# Patient Record
Sex: Male | Born: 1950 | Race: White | Hispanic: No
Health system: Southern US, Community
[De-identification: ages and names within clinical notes are randomized; demographics above are authoritative.]

## PROBLEM LIST (undated history)

## (undated) DIAGNOSIS — M1990 Unspecified osteoarthritis, unspecified site: Secondary | ICD-10-CM

## (undated) DIAGNOSIS — G510 Bell's palsy: Secondary | ICD-10-CM

## (undated) DIAGNOSIS — R339 Retention of urine, unspecified: Secondary | ICD-10-CM

## (undated) DIAGNOSIS — R51 Headache: Secondary | ICD-10-CM

## (undated) DIAGNOSIS — F32A Depression, unspecified: Secondary | ICD-10-CM

## (undated) DIAGNOSIS — Z960 Presence of urogenital implants: Secondary | ICD-10-CM

## (undated) DIAGNOSIS — E785 Hyperlipidemia, unspecified: Secondary | ICD-10-CM

## (undated) DIAGNOSIS — I10 Essential (primary) hypertension: Secondary | ICD-10-CM

## (undated) DIAGNOSIS — N390 Urinary tract infection, site not specified: Secondary | ICD-10-CM

## (undated) DIAGNOSIS — K219 Gastro-esophageal reflux disease without esophagitis: Secondary | ICD-10-CM

## (undated) DIAGNOSIS — R351 Nocturia: Secondary | ICD-10-CM

## (undated) DIAGNOSIS — Z978 Presence of other specified devices: Secondary | ICD-10-CM

## (undated) DIAGNOSIS — F329 Major depressive disorder, single episode, unspecified: Secondary | ICD-10-CM

## (undated) DIAGNOSIS — R7303 Prediabetes: Secondary | ICD-10-CM

## (undated) HISTORY — PX: LAPAROSCOPIC LYSIS INTESTINAL ADHESIONS: SUR778

## (undated) HISTORY — PX: NISSEN FUNDOPLICATION: SHX2091

## (undated) HISTORY — DX: Prediabetes: R73.03

## (undated) HISTORY — DX: Hyperlipidemia, unspecified: E78.5

## (undated) HISTORY — DX: Essential (primary) hypertension: I10

## (undated) HISTORY — DX: Gastro-esophageal reflux disease without esophagitis: K21.9

## (undated) HISTORY — DX: Bell's palsy: G51.0

## (undated) HISTORY — PX: NERVE SURGERY: SHX1016

## (undated) HISTORY — PX: HERNIA REPAIR: SHX51

## (undated) HISTORY — PX: CARPAL TUNNEL RELEASE: SHX101

---

## 1997-09-23 ENCOUNTER — Ambulatory Visit (HOSPITAL_BASED_OUTPATIENT_CLINIC_OR_DEPARTMENT_OTHER): Admission: RE | Admit: 1997-09-23 | Discharge: 1997-09-23 | Payer: Self-pay | Admitting: General Surgery

## 2000-03-07 ENCOUNTER — Encounter (HOSPITAL_BASED_OUTPATIENT_CLINIC_OR_DEPARTMENT_OTHER): Payer: Self-pay | Admitting: General Surgery

## 2000-03-09 ENCOUNTER — Ambulatory Visit (HOSPITAL_COMMUNITY): Admission: RE | Admit: 2000-03-09 | Discharge: 2000-03-09 | Payer: Self-pay | Admitting: General Surgery

## 2000-03-09 ENCOUNTER — Encounter (INDEPENDENT_AMBULATORY_CARE_PROVIDER_SITE_OTHER): Payer: Self-pay | Admitting: *Deleted

## 2002-04-17 ENCOUNTER — Encounter: Payer: Self-pay | Admitting: Surgery

## 2002-04-25 ENCOUNTER — Observation Stay (HOSPITAL_COMMUNITY): Admission: RE | Admit: 2002-04-25 | Discharge: 2002-04-27 | Payer: Self-pay | Admitting: Surgery

## 2005-06-23 ENCOUNTER — Ambulatory Visit (HOSPITAL_COMMUNITY): Admission: RE | Admit: 2005-06-23 | Discharge: 2005-06-23 | Payer: Self-pay | Admitting: Internal Medicine

## 2005-06-30 ENCOUNTER — Ambulatory Visit: Payer: Self-pay

## 2005-12-26 ENCOUNTER — Ambulatory Visit (HOSPITAL_COMMUNITY): Admission: RE | Admit: 2005-12-26 | Discharge: 2005-12-26 | Payer: Self-pay | Admitting: Physician Assistant

## 2008-04-23 ENCOUNTER — Ambulatory Visit (HOSPITAL_COMMUNITY): Admission: RE | Admit: 2008-04-23 | Discharge: 2008-04-23 | Payer: Self-pay | Admitting: Internal Medicine

## 2008-04-28 ENCOUNTER — Encounter: Admission: RE | Admit: 2008-04-28 | Discharge: 2008-04-28 | Payer: Self-pay | Admitting: Family Medicine

## 2008-04-28 ENCOUNTER — Encounter (INDEPENDENT_AMBULATORY_CARE_PROVIDER_SITE_OTHER): Payer: Self-pay | Admitting: *Deleted

## 2008-05-02 ENCOUNTER — Encounter: Payer: Self-pay | Admitting: Gastroenterology

## 2008-05-05 ENCOUNTER — Telehealth: Payer: Self-pay | Admitting: Gastroenterology

## 2008-05-12 ENCOUNTER — Ambulatory Visit: Payer: Self-pay | Admitting: Cardiovascular Disease

## 2008-05-13 ENCOUNTER — Ambulatory Visit: Payer: Self-pay

## 2008-05-13 ENCOUNTER — Encounter: Payer: Self-pay | Admitting: Cardiovascular Disease

## 2008-05-16 LAB — HM COLONOSCOPY

## 2008-05-26 ENCOUNTER — Emergency Department (HOSPITAL_COMMUNITY): Admission: EM | Admit: 2008-05-26 | Discharge: 2008-05-26 | Payer: Self-pay | Admitting: Emergency Medicine

## 2008-05-26 ENCOUNTER — Ambulatory Visit: Payer: Self-pay | Admitting: Gastroenterology

## 2008-05-26 DIAGNOSIS — D509 Iron deficiency anemia, unspecified: Secondary | ICD-10-CM | POA: Insufficient documentation

## 2008-05-29 ENCOUNTER — Telehealth: Payer: Self-pay | Admitting: Gastroenterology

## 2008-06-03 ENCOUNTER — Telehealth: Payer: Self-pay | Admitting: Gastroenterology

## 2008-06-03 ENCOUNTER — Ambulatory Visit: Payer: Self-pay | Admitting: Gastroenterology

## 2008-06-03 ENCOUNTER — Encounter: Payer: Self-pay | Admitting: Gastroenterology

## 2008-06-05 ENCOUNTER — Encounter: Payer: Self-pay | Admitting: Gastroenterology

## 2008-06-06 ENCOUNTER — Ambulatory Visit (HOSPITAL_COMMUNITY): Admission: RE | Admit: 2008-06-06 | Discharge: 2008-06-06 | Payer: Self-pay | Admitting: Gastroenterology

## 2008-06-10 ENCOUNTER — Telehealth: Payer: Self-pay | Admitting: Gastroenterology

## 2008-06-11 ENCOUNTER — Encounter: Payer: Self-pay | Admitting: Gastroenterology

## 2008-06-17 ENCOUNTER — Ambulatory Visit (HOSPITAL_COMMUNITY): Admission: RE | Admit: 2008-06-17 | Discharge: 2008-06-17 | Payer: Self-pay | Admitting: Gastroenterology

## 2008-06-17 ENCOUNTER — Encounter: Payer: Self-pay | Admitting: Gastroenterology

## 2008-06-27 ENCOUNTER — Ambulatory Visit: Payer: Self-pay | Admitting: Gastroenterology

## 2008-07-04 ENCOUNTER — Ambulatory Visit: Payer: Self-pay | Admitting: Gastroenterology

## 2008-07-14 ENCOUNTER — Encounter: Payer: Self-pay | Admitting: Gastroenterology

## 2008-08-27 ENCOUNTER — Encounter: Payer: Self-pay | Admitting: Gastroenterology

## 2008-09-18 ENCOUNTER — Inpatient Hospital Stay (HOSPITAL_COMMUNITY): Admission: RE | Admit: 2008-09-18 | Discharge: 2008-09-21 | Payer: Self-pay | Admitting: General Surgery

## 2008-10-01 ENCOUNTER — Encounter: Payer: Self-pay | Admitting: Gastroenterology

## 2008-10-07 ENCOUNTER — Encounter: Payer: Self-pay | Admitting: Gastroenterology

## 2010-04-13 ENCOUNTER — Encounter: Payer: Self-pay | Admitting: Internal Medicine

## 2010-04-14 ENCOUNTER — Ambulatory Visit (HOSPITAL_COMMUNITY)
Admission: RE | Admit: 2010-04-14 | Discharge: 2010-04-14 | Payer: Self-pay | Source: Home / Self Care | Admitting: Internal Medicine

## 2010-04-26 ENCOUNTER — Ambulatory Visit: Payer: Self-pay | Admitting: Internal Medicine

## 2010-04-29 ENCOUNTER — Ambulatory Visit (HOSPITAL_COMMUNITY)
Admission: RE | Admit: 2010-04-29 | Discharge: 2010-04-29 | Payer: Self-pay | Source: Home / Self Care | Attending: Internal Medicine | Admitting: Internal Medicine

## 2010-04-29 ENCOUNTER — Encounter: Payer: Self-pay | Admitting: Internal Medicine

## 2010-04-29 ENCOUNTER — Ambulatory Visit: Payer: Self-pay | Admitting: Internal Medicine

## 2010-04-30 ENCOUNTER — Telehealth: Payer: Self-pay | Admitting: Internal Medicine

## 2010-04-30 ENCOUNTER — Encounter: Payer: Self-pay | Admitting: Internal Medicine

## 2010-05-03 ENCOUNTER — Telehealth (INDEPENDENT_AMBULATORY_CARE_PROVIDER_SITE_OTHER): Payer: Self-pay | Admitting: *Deleted

## 2010-05-24 ENCOUNTER — Ambulatory Visit (HOSPITAL_COMMUNITY)
Admission: RE | Admit: 2010-05-24 | Discharge: 2010-05-24 | Payer: Self-pay | Source: Home / Self Care | Attending: Internal Medicine | Admitting: Internal Medicine

## 2010-05-24 ENCOUNTER — Encounter: Payer: Self-pay | Admitting: Internal Medicine

## 2010-05-25 ENCOUNTER — Ambulatory Visit
Admission: RE | Admit: 2010-05-25 | Discharge: 2010-05-25 | Payer: Self-pay | Source: Home / Self Care | Attending: Internal Medicine | Admitting: Internal Medicine

## 2010-06-17 NOTE — Letter (Signed)
Summary: [facility]  [facility]   Imported By: Sherian Rein 05/05/2010 09:46:36  _____________________________________________________________________  External Attachment:    Type:   Image     Comment:   External Document

## 2010-06-17 NOTE — Progress Notes (Signed)
Summary: needs CPST  Phone Note Outgoing Call   Summary of Call: PFT normal. 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 Both do not explain dyspnea. Needs CPST WITH EIB. ROV on 05/25/2009 after CPST Initial call taken by: Kalman Shan MD,  April 30, 2010 5:57 PM

## 2010-06-17 NOTE — Letter (Signed)
Summary: CPST [facility]  [address]   [place], [place] [address]   Phone: [phone]  Fax: [phone]     Patient's Name: [name] Date of Birth: [birth year] MRN: [id number]  CPST  Choose test method and choice  a)_x__Bike - recommended by ATS/ACCP. Do at [facility] at Dr[facility] G[facility]  b)___Treadmill - less preferred. Do at [facility] at Dr. Gala Romney lab or do at [facility] PFT lab  Choose one or more indication for test  INDICATIONS FOR CARDIOPULMONARY EXERCISE TESTING Evaluation of exercise tolerance ______ Determination of functional impairment or capacity (peak V? O2) ______ Determination of exercise-limiting factors and pathophysiologic mechanisms  Evaluation of undiagnosed exercise intolerance __x___ Assessing contribution of cardiac and pulmonary etiology in coexisting disease ___x__ Symptoms disproportionate to resting pulmonary and cardiac tests  __x___Unexplained dyspnea when initial cardiopulmonary testing is nondiagnostic  Evaluation of patients with cardiovascular disease _____ Functional evaluation and prognosis in patients with heart failure _____ Selection for cardiac transplantation _____ Exercise prescription and monitoring response to exercise training for cardiac rehabilitation (special circumstances; i.e., pacemakers)  Evaluation of patients with respiratory disease _____ Functional impairment assessment (see specific clinical applications)  _____Chronic obstructive pulmonary disease Establishing exercise limitation(s) and assessing other potential contributing factors, especially occult heart disease (ischemia) ______Determination of magnitude of hypoxemia and for O2 prescription When objective determination of therapeutic intervention is necessary and not adequately addressed by standard pulmonary function testing  _____ Interstitial lung diseases _____Detection of early (occult) gas exchange abnormalities _____Overall  assessment/monitoring of pulmonary gas exchange _____Determination of magnitude of hypoxemia and for O2 prescription _____Determination of potential exercise-limiting factors _____Documentation of therapeutic response to potentially toxic therapy  ____ Pulmonary vascular disease (careful risk-benefit analysis required)  ____ Cystic fibrosis  __x__ Exercise-induced bronchospasm  Specific clinical applications ____  Preoperative evaluation _____Lung resectional surgery _____Elderly patients undergoing major abdominal surgery _____Lung volume resectional surgery for emphysema (currently investigational)  ____ Exercise evaluation and prescription for pulmonary rehabilitation  ____ Evaluation for impairment-disability  ____ Evaluation for lung, heart-lung transplantation ____ Definition of abbreviation: V? O2______ -oxygen consumption.    Kalman Shan MD    [facility]  Appended Document: CPST Form Appt scheduled for 05/24/10 at 11:30 [facility]. Appt information mailed to patient.

## 2010-06-17 NOTE — Miscellaneous (Signed)
Summary: Orders Update pft charges  Clinical Lists Changes  Orders: Added new Service order of Carbon Monoxide diffusing w/capacity (94720) - Signed Added new Service order of Lung Volumes (94240) - Signed Added new Service order of Spirometry (Pre & Post) (94060) - Signed 

## 2010-06-17 NOTE — Progress Notes (Signed)
Summary: returning call  Phone Note Call from Patient Call back at Home Phone [phone]   Caller: Patient Call For: ramaswamy Reason for Call: Talk to Nurse Summary of Call: Patient returning Dr. Jane Canary call. Initial call taken by: Lehman Prom,  May 03, 2010 9:33 AM  Follow-up for Phone Call        Pt informed of PFT and Sniff Test results.  Order for CPST sent to [facility] to schedule.  Pt aware to keep f/u appt with Ramaswamy om 05-25-09. Abigail Miyamoto RN  May 03, 2010 11:08 AM

## 2010-06-17 NOTE — Letter (Signed)
Summary: CPST- R/O Contraindications  [facility]  [address]   [place], [place] [address]   Phone: [phone]  Fax: [phone]    Patient's Name: [name] Date of Birth: 11-Mar-1951 MRN: [id number]  *********Rule out Contraindications**************** Absolute                                                                                                                           ___ Acute MI (3-5 Days)                                 ___ Unstable Angina                                          ___ Uncontrolled arrhythmias causing symptoms or hemodynamic compromise. ___ Syncope                                                     ___ Active endocarditis                                         ___ Acute Myocarditis/Pericarditis                        ___ Symptomatic severe aortic stenosis  ___ Acute Pulmonary embolus or pulmonary infarction                ___ Uncontrolled Heart Failure  ___ Thrombosis of lower extremitie ___ Suspected dissecting aneurysm ___ Uncontrolled Asthma                          ___ Pulmonary Edema                                        ___ RA desat @ rest<85%                                      ___ Repiratory Failure                                         ___ Acute noncardiopulmonary disorder that may affect exercise performance or be         aggravated by exercise (ie infection, renal failure,  thyrotoxicosis) .                               ___ Mental impairment leading to inabliity to cooperate   Relative ___ Left main coronary stenosis or its equivalent ___ Moderate stentoic valvular heart disease ___ Severe untreated arterial hypertension @ rest (<200 mmHg             systolic,>132mmHg Diastolic ___ Tachy/Brady Arrhythmias ___ High- degree artioventricular block ___ Hypertrophic cardiomyopathy ___ Significant pulmonary hypertension ___ Advanced or complicated pregnancy ___ Electrolyte abnormalities ___ Orthopedic impairment that  compromises exercise performance  NO CONTRAINDICATIONS  Kalman Shan MD    [facility]

## 2010-06-17 NOTE — Assessment & Plan Note (Signed)
Summary: F/U DYSPNEA/RJC   Visit Type:  Follow-up Copy to:  Dr. Lucky Cowboy Primary Provider/Referring Provider:  Dr. Lucky Cowboy - PMD, DR Bertram Savin - [facility]  CC:  Pt here to discuss CPST results. .  History of Present Illness: 60 year old male. Sent by PMD because of hypoxemia discovered at PMD office on 04/13/2010 for routine physica.. States that even a year ago when in hospital for hiatal hernia repair o2 was low but never started on O2.  He does admit to dyspnea since 1991. Insidious onset. Was pasisve exposed to cig smoke for 34 years and paint fumes at [facility] pain department 1998 - 2000.  Normal echo per chart review in dec 2009 except mild diast dysfn.  Dxde with large hiatal hernia with stomach in Abdomen on CT dec 2009. It was then suspected that dyspnea was due to hernia. s/p hernia repair and nissen fundoplication in May 2010.  However, since surgery dyspnea is worse.  Currently dyspneic for walking 100 feet, any strenous work at [employer].  Dyspnea improved by lying dow, relazing, sitting in wheel chair.  CXR 04/14/2010 shows new left diaph paralysis since Dec 2009.   He does admits to [employer] epigastric/lower chest pain that is atypical. Pain made worse by physicla pressure on spot or lifting. Pain improved by deep breaths. No associated radiation. Denies associated cough, wheezing, syncope, hemoptysis. Of note, his room air puse ox at rest was88% but after walking it improved to 94%. REC: FULL PFT CPST and SNIFF TEST   May 25, 2010: followup dyspnea. Returns to review test restults. 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 Full PFts were normal. Underwent CPST yesterday 05/24/2010. This was normal exccept for slight inadequate HR resposse due to beta blockade and mild dead space ventilation increase. Overall normal test though. He states though dyspnea persists unchanged. No new symptoms   Preventive Screening-Counseling &  Management  Alcohol-Tobacco     Smoking Status: never     Passive Smoke Exposure: yes  Current Medications (verified): 1)  Cyclobenzaprine Hcl 10 Mg Tabs (Cyclobenzaprine Hcl) .... 2 Daily 2)  Complete Allergy Relief 25 Mg Tabs (Diphenhydramine Hcl) .Marland Kitchen.. 1 Tablet By Mouth Once Daily 3)  Omeprazole 20 Mg  Cpdr (Omeprazole) .Marland Kitchen.. 1 Each Day 30 Minutes Before Meal 4)  Advair Diskus 250-50 Mcg/dose Misc (Fluticasone-Salmeterol) .Marland Kitchen.. 1 Puff in The Morning and 1 Puff At Night 5)  Adult Aspirin Low Strength 81 Mg Tbdp (Aspirin) .Marland Kitchen.. 1 By Mouth Once Daily 6)  Tramadol Hcl 50 Mg Tabs (Tramadol Hcl) .Marland Kitchen.. 1 By Mouth Once Daily 7)  Meclizine Hcl 25 Mg Tabs (Meclizine Hcl) .Marland Kitchen.. 1 By Mouth Once Daily 8)  Cvs Calcium Carbonate/vit D 500-125 Mg-Unit Tabs (Calcium Carbonate-Vitamin D) .Marland Kitchen.. 1 Three Times A Day 9)  Pravachol 40 Mg Tabs (Pravastatin Sodium) .... 1/2 By Mouth Once Daily 10)  Atenolol 100 Mg Tabs (Atenolol) .Marland Kitchen.. 1 By Mouth Once Daily 11)  Citalopram Hydrobromide 40 Mg Tabs (Citalopram Hydrobromide) .Marland Kitchen.. 1 By Mouth Once Daily 12)  Meloxicam 15 Mg Tabs (Meloxicam) .Marland Kitchen.. 1 By Mouth Once Daily 13)  Multivitamins   Tabs (Multiple Vitamin) .Marland Kitchen.. 1 By Mouth Once Daily 14)  Fish Oil   Oil (Fish Oil) .Marland Kitchen.. 1 By Mouth Once Daily 15)  Feosol 200 (65 Fe) Mg Tabs (Ferrous Sulfate Dried) .... Take One Tab Twice A Day  Allergies (verified): No Known Drug Allergies  Past History:  Past medical, surgical, family and social histories (including risk  factors) reviewed, and no changes noted (except as noted below).  Past Medical History: Reviewed history from 04/26/2010 and no changes required. Hyperlipidemia Hypertension Allergic Rhinitis Dyspnea/Hypoxemia  - ct dec 2009 - large hiatalhernia - stomach in chest. s/p repair may 2010  - echo dec 2009 - diast dysfhn - repeat cxr nov 2011 - left dipahr paralysis  Past Surgical History: Reviewed history from 04/26/2010 and no changes required. 1.  Laparoscopic paraesophageal hernia repair, large. - May 6. 2010  2. Nissen fundoplication.   3. Lysis adhesions approximately thirty minutes.   Family History: Reviewed history from 05/26/2008 and no changes required. Family History of Colon Cancer: mother Family History of Prostate Cancer: father  Social History: Reviewed history from 04/26/2010 and no changes required. 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  Wife was a smoker x 34 years Alcohol Use - no Daily Caffeine Use Illicit Drug Use - no  Review of Systems       The patient complains of shortness of breath with activity.  The patient denies shortness of breath at rest, productive cough, non-productive cough, coughing up blood, chest pain, irregular heartbeats, acid heartburn, indigestion, loss of appetite, weight change, abdominal pain, difficulty swallowing, sore throat, tooth/dental problems, headaches, nasal congestion/difficulty breathing through nose, sneezing, itching, ear ache, anxiety, depression, hand/feet swelling, joint stiffness or pain, rash, change in color of mucus, and fever.    Vital Signs:  Patient profile:   60 year old male Height:      73 inches Weight:      231.25 pounds BMI:     30.62 O2 Sat:      90 % on Room air Temp:     98.0 degrees F oral Pulse rate:   72 / minute BP sitting:   106 / 62  (right arm) Cuff size:   regular  Vitals Entered By: Carron Curie CMA (May 25, 2010 3:17 PM)  O2 Flow:  Room air CC: Pt here to discuss CPST results.  Comments Medications reviewed with patient Carron Curie CMA  May 25, 2010 3:17 PM Daytime phone number verified with patient.    Physical Exam  General:  well developed, well nourished, in no acute distress Head:  normocephalic and atraumatic Eyes:  PERRLA/EOM intact; conjunctiva and sclera clear Ears:  TMs intact and clear with normal canals Nose:  no deformity, discharge, inflammation, or  lesions Mouth:  no deformity or lesions Neck:  no masses, thyromegaly, or abnormal cervical nodes Chest Wall:  no deformities noted Lungs:  clear bilaterally to auscultation and percussion Heart:  regular rate and rhythm, S1, S2 without murmurs, rubs, gallops, or clicks Abdomen:  bowel sounds positive; abdomen soft and non-tender without masses, or organomegaly Msk:  no deformity or scoliosis noted with normal posture Pulses:  pulses normal Extremities:  no clubbing, cyanosis, edema, or deformity noted Neurologic:  CN II-XII grossly intact with normal reflexes, coordination, muscle strength and tone Skin:  intact without lesions or rashes Cervical Nodes:  no significant adenopathy Axillary Nodes:  no significant adenopathy Psych:  alert and cooperative; normal mood and affect; normal attention span and concentration   MISC. 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 Full PFts were normal. Underwent CPST yesterday 05/24/2010. This was normal exccept for slight inadequate HR resposse due to beta blockade and mild dead space ventilation increase. Overall normal test though.  Impression & Recommendations:  Problem # 1:  DYSPNEA (ICD-786.05) Assessment Unchanged Ex heavy passive smoker. Has chronic dyspnea on exertion with hyooxemia at rest. Dysnea is at class 2-3 level. On walking he does not sem to desaturate ad in fact pulse ox picked up suggesting underlying atelectasis related to raised left diaphragm. . 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 CPST test 05/24/2010 is normal but for mild reduction in HRresponse due to beta blocker and mild elevation in dead space possibly rlated to the left diaprhagm issue.   PLAN would strongly recommend pulmonary rehab if not better from this , could consider cuttng down on beta blocker rov 3 months he  agrees with plan  Other Orders: Rehabilitation Referral (Rehab) Est. Patient Level III ([address])  Patient Instructions: 1)  your difficulty breathing is becuse of  2)   - left diaphragm partly not working  3)   - partly becuase you are out of shape after surgery 4)  recommend pulmonary rehab program at [facility] 5)    - take 1 month to get in 6)   - class is on tuesday/thursday 7)   - lasts 2-3 months 8)  return to see me in 3 months 9)  i will send note to your doctors and speak to Dr. Freida Busman the surgeon

## 2010-06-17 NOTE — Assessment & Plan Note (Signed)
Summary: sob/low o2 sats/cb   Visit Type:  Initial Consult Copy to:  Dr. Lucky Cowboy Primary Provider/Referring Provider:  Dr. Lucky Cowboy  CC:  Pulmonary Consult for SOB. [name]  History of Present Illness: 60 year old male. Sent by PMD because of hypoxemia discovered at PMD office on 04/13/2010 for routine physica.. States that even a year ago when in hospital for hiatal hernia repair o2 was low but never started on O2.  He does admit to dyspnea since 1991. Insidious onset. Was pasisve exposed to cig smoke for 34 years and paint fumes at [facility] pain department 1998 - 2000.  Normal echo per chart review in dec 2009 except mild diast dysfn.  Dxde with large hiatal hernia with stomach in Abdomen on CT dec 2009. It was then suspected that dyspnea was due to hernia. s/p hernia repair and nissen fundoplication in May 2010.  However, since surgery dyspnea is worse.  Currently dyspneic for walking 100 feet, any strenous work at [employer].  Dyspnea improved by lying dow, relazing, sitting in wheel chair.  CXR 04/14/2010 shows new left diaph paralysis since Dec 2009.   He does admits to [employer] epigastric/lower chest pain that is atypical. Pain made worse by physicla pressure on spot or lifting. Pain improved by deep breaths. No associated radiation. Denies associated cough, wheezing, syncope, hemoptysis  Of note, his room air puse ox at rest was88% but after walking it improved to 94%  Preventive Screening-Counseling & Management  Alcohol-Tobacco     Smoking Status: never     Passive Smoke Exposure: yes  Comments: wife passed in 2007. Exposed to her cigarettes for 35 years  Current Medications (verified): 1)  Cyclobenzaprine Hcl 10 Mg Tabs (Cyclobenzaprine Hcl) .... 2 Daily 2)  Complete Allergy Relief 25 Mg Tabs (Diphenhydramine Hcl) .[name].. 1 Tablet By Mouth Once Daily 3)  Omeprazole 20 Mg  Cpdr (Omeprazole) .[name].. 1 Each Day 30 Minutes Before Meal 4)  Advair Diskus 250-50 Mcg/dose Misc  (Fluticasone-Salmeterol) .[name].. 1 Puff in The Morning and 1 Puff At Night 5)  Adult Aspirin Low Strength 81 Mg Tbdp (Aspirin) .[name].. 1 By Mouth Once Daily 6)  Acai 500 Mg Caps (Acai) .[name].. 1 By Mouth Once Daily 7)  Tramadol Hcl 50 Mg Tabs (Tramadol Hcl) .[name].. 1 By Mouth Once Daily 8)  Meclizine Hcl 25 Mg Tabs (Meclizine Hcl) .[name].. 1 By Mouth Once Daily 9)  Cvs Calcium Carbonate/vit D 500-125 Mg-Unit Tabs (Calcium Carbonate-Vitamin D) .[name].. 1 Three Times A Day 10)  Pravachol 40 Mg Tabs (Pravastatin Sodium) .... 1/2 By Mouth Once Daily 11)  Atenolol 100 Mg Tabs (Atenolol) .[name].. 1 By Mouth Once Daily 12)  Citalopram Hydrobromide 40 Mg Tabs (Citalopram Hydrobromide) .[name].. 1 By Mouth Once Daily 13)  Meloxicam 15 Mg Tabs (Meloxicam) .[name].. 1 By Mouth Once Daily 14)  Multivitamins   Tabs (Multiple Vitamin) .[name].. 1 By Mouth Once Daily 15)  Fish Oil   Oil (Fish Oil) .[name].. 1 By Mouth Once Daily 16)  Feosol 200 (65 Fe) Mg Tabs (Ferrous Sulfate Dried) .... Take One Tab Twice A Day 17)  Tamsulosin Hcl 0.4 Mg Caps (Tamsulosin Hcl) .... Take 1 Tablet By Mouth Once A Day  Allergies (verified): No Known Drug Allergies  Past History:  Past Medical History: Hyperlipidemia Hypertension Allergic Rhinitis Dyspnea/Hypoxemia  - ct dec 2009 - large hiatalhernia - stomach in chest. s/p repair may 2010  - echo dec 2009 - diast dysfhn - repeat cxr nov 2011 - left dipahr  paralysis  Past Surgical History: 1. Laparoscopic paraesophageal hernia repair, large. - May 6. 2010  2. Nissen fundoplication.   3. Lysis adhesions approximately thirty minutes.   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  Wife was a smoker x 34 years Alcohol Use - no Daily Caffeine Use Illicit Drug Use - no Passive Smoke Exposure:  yes  Review of Systems       The patient complains of shortness of breath with activity, chest pain, acid heartburn, indigestion, depression, and joint stiffness or pain.   The patient denies shortness of breath at rest, productive cough, non-productive cough, coughing up blood, irregular heartbeats, loss of appetite, weight change, abdominal pain, difficulty swallowing, sore throat, tooth/dental problems, headaches, nasal congestion/difficulty breathing through nose, sneezing, itching, ear ache, anxiety, hand/feet swelling, rash, change in color of mucus, and fever.    Vital Signs:  Patient profile:   60 year old male Height:      73 inches Weight:      232.50 pounds BMI:     30.79 O2 Sat:      88 % on Room air Temp:     97.8 degrees F oral Pulse rate:   83 / minute BP sitting:   142 / 98  (right arm) Cuff size:   regular  Vitals Entered By: Carron Curie CMA (April 26, 2010 3:57 PM)  O2 Flow:  Room air CC: Pulmonary Consult for SOB.  Comments Medications reviewed with patient Carron Curie CMA  April 26, 2010 3:57 PM Daytime phone number verified with patient.    Physical Exam  General:  well developed, well nourished, in no acute distress Head:  normocephalic and atraumatic Eyes:  PERRLA/EOM intact; conjunctiva and sclera clear Ears:  TMs intact and clear with normal canals Nose:  no deformity, discharge, inflammation, or lesions Mouth:  no deformity or lesions Neck:  no masses, thyromegaly, or abnormal cervical nodes Chest Wall:  no deformities noted Lungs:  clear bilaterally to auscultation and percussion Heart:  regular rate and rhythm, S1, S2 without murmurs, rubs, gallops, or clicks Abdomen:  bowel sounds positive; abdomen soft and non-tender without masses, or organomegaly Msk:  no deformity or scoliosis noted with normal posture Pulses:  pulses normal Extremities:  no clubbing, cyanosis, edema, or deformity noted Neurologic:  CN II-XII grossly intact with normal reflexes, coordination, muscle strength and tone Skin:  intact without lesions or rashes Cervical Nodes:  no significant adenopathy Axillary Nodes:  no  significant adenopathy Psych:  alert and cooperative; normal mood and affect; normal attention span and concentration   CXR  Procedure date:  04/14/2010  Findings:      Service Report Text  [facility] Accession Number: [id number]      Clinical Data: Shortness of breath.  Chest discomfort.   Hypertension.    CHEST - 2 VIEW    Comparison: 04/23/2008    Findings: Heart size is within normal limits.  Both lungs are   clear.  No evidence of pleural effusion.  No mass or adenopathy   identified.    IMPRESSION:   No active cardiopulmonary disease.    Read By:  Danae Orleans,  M.D.   Released By:  Danae Orleans,  M.D.  Comments:      in my opirion there is elevation of LEFT DIAPHRAGM which is NEW since 04/23/2008  CT of Chest  Procedure date:  05/30/2007  Findings:      clear lung fields large hiatal hernia -  almost entire stomach in chest  Impression & Recommendations:  Problem # 1:  DYSPNEA (ICD-786.05) Assessment New Ex heavy passive smoker. Has chronic dyspnea on exertion with hyooxemia at rest. Dysnea is at class 2-3 level. On walking he does not sem to desaturate. Latest cxr suggest postop diaphramatic left side paralysis. DDx includes raised left diaphragm with associated atelctasis that actually improves on exertion or COPD or deconditioning  plan sniff test full pft review after above Orders: Pulmonary Referral (Pulmonary) Radiology Referral (Radiology) Consultation Level V [phone])  Medications Added to Medication List This Visit: 1)  Cyclobenzaprine Hcl 10 Mg Tabs (Cyclobenzaprine hcl) .... 2 daily 2)  Tamsulosin Hcl 0.4 Mg Caps (Tamsulosin hcl) .... Take 1 tablet by mouth once a day  Patient Instructions: 1)  please have full breathing test called PFT 2)   - at [place] or [facility] 3)  please have a test called SNIFF test 4)    - [place] [facility] 5)  return to see me after above in < 2weeks   Immunization History:  Influenza  Immunization History:    Influenza:  historical (03/16/2010)  Pneumovax Immunization History:    Pneumovax:  historical (03/20/2006)  Appended Document: sob/low o2 sats/cb Ambulatory Pulse Oximetry  Resting; HR____69_    02 Sat_93____  Lap1 (185 feet)   HR__77___   02 Sat__94___ Lap2 (185 feet)   HR__82___   02 Sat_92____    Lap3 (185 feet)   HR___88__   02 Sat__94___  __x_Test Completed without Difficulty ___Test Stopped due to:

## 2010-07-23 ENCOUNTER — Telehealth: Payer: Self-pay | Admitting: Internal Medicine

## 2010-07-29 ENCOUNTER — Encounter: Payer: Self-pay | Admitting: Internal Medicine

## 2010-08-02 ENCOUNTER — Encounter (HOSPITAL_COMMUNITY): Payer: BC Managed Care – PPO | Attending: Internal Medicine

## 2010-08-02 DIAGNOSIS — R0902 Hypoxemia: Secondary | ICD-10-CM | POA: Insufficient documentation

## 2010-08-02 DIAGNOSIS — T59891A Toxic effect of other specified gases, fumes and vapors, accidental (unintentional), initial encounter: Secondary | ICD-10-CM | POA: Insufficient documentation

## 2010-08-02 DIAGNOSIS — T59811A Toxic effect of smoke, accidental (unintentional), initial encounter: Secondary | ICD-10-CM | POA: Insufficient documentation

## 2010-08-02 DIAGNOSIS — E785 Hyperlipidemia, unspecified: Secondary | ICD-10-CM | POA: Insufficient documentation

## 2010-08-02 DIAGNOSIS — I10 Essential (primary) hypertension: Secondary | ICD-10-CM | POA: Insufficient documentation

## 2010-08-03 NOTE — Letter (Signed)
Summary: [facility]  [address]   [place], [place] [address]   Phone: [phone]  Fax: [phone]    07/29/2010  [name] [address] [place], [place]  [address]  To Whom it May Concern,    The above named patient needs to be allowed to take short breaks to catch his breath during his work day due to his medical condition. Please contact my office with any additional questions.            Sincerely,   Dr. Kalman Shan

## 2010-08-03 NOTE — Progress Notes (Signed)
Summary: work Statistician Note Call from Patient Call back at [employer] [phone]   Caller: Patient Call For: Odai Wimmer Summary of Call: Patient needing a work note stating he should be able to sit down when sob.   Initial call taken by: Lehman Prom,  July 23, 2010 12:39 PM  Follow-up for Phone Call        Spoke with pt.  He wanted to let MR know that he is going to be starting pulm rehab next month.  He also wants to know if MR will dictate a letter for him stating that sometimes her gets SOB and needs to be able to sit and rest while at work when this happens.  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  I advised that MR out of the office today and will be back on 3/12.  Pt verbalized understanding and states that if MR does decide to do a letter, he would like this mailed to him. Pls advise thanks Follow-up by: Vernie Murders,  July 23, 2010 4:38 PM  Additional Follow-up for Phone Call Additional follow up Details #1::        ok to do letter for him to sit down for a little bit to catch his breath while at work. Can you guys do this letter or I should do ? Additional Follow-up by: Kalman Shan MD,  July 23, 2010 5:03 PM    Additional Follow-up for Phone Call Additional follow up Details #2::    Per TD, this should be done by the physician. Thanks Vernie Murders  July 23, 2010 5:08 PM   Additional Follow-up for Phone Call Additional follow up Details #3:: Details for Additional Follow-up Action Taken: forward to [facility] Additional Follow-up by: Kalman Shan MD,  July 28, 2010 5:49 PM  letter written and mailed and pt aware.Carron Curie CMA  July 29, 2010 2:53 PM

## 2010-08-11 ENCOUNTER — Encounter: Payer: Self-pay | Admitting: Internal Medicine

## 2010-08-17 ENCOUNTER — Encounter (HOSPITAL_COMMUNITY): Payer: BC Managed Care – PPO | Attending: Internal Medicine

## 2010-08-17 DIAGNOSIS — I10 Essential (primary) hypertension: Secondary | ICD-10-CM | POA: Insufficient documentation

## 2010-08-17 DIAGNOSIS — E785 Hyperlipidemia, unspecified: Secondary | ICD-10-CM | POA: Insufficient documentation

## 2010-08-17 DIAGNOSIS — R0902 Hypoxemia: Secondary | ICD-10-CM | POA: Insufficient documentation

## 2010-08-17 DIAGNOSIS — T59891A Toxic effect of other specified gases, fumes and vapors, accidental (unintentional), initial encounter: Secondary | ICD-10-CM | POA: Insufficient documentation

## 2010-08-17 DIAGNOSIS — T59811A Toxic effect of smoke, accidental (unintentional), initial encounter: Secondary | ICD-10-CM | POA: Insufficient documentation

## 2010-08-19 ENCOUNTER — Encounter (HOSPITAL_COMMUNITY): Payer: BC Managed Care – PPO

## 2010-08-24 ENCOUNTER — Encounter (HOSPITAL_COMMUNITY): Payer: BC Managed Care – PPO

## 2010-08-24 LAB — BASIC METABOLIC PANEL
CO2: 28 mEq/L (ref 19–32)
Calcium: 8.3 mg/dL — ABNORMAL LOW (ref 8.4–10.5)
Creatinine, Ser: 0.98 mg/dL (ref 0.4–1.5)
GFR calc Af Amer: >60 mL/min (ref >60)
GFR calc non Af Amer: >60 mL/min (ref >60)

## 2010-08-25 LAB — BASIC METABOLIC PANEL
Calcium: 9.4 mg/dL (ref 8.4–10.5)
GFR calc Af Amer: >60 mL/min (ref >60)
GFR calc non Af Amer: >60 mL/min (ref >60)
Glucose, Bld: 99 mg/dL (ref 70–99)
Sodium: 141 mEq/L (ref 135–145)

## 2010-08-25 LAB — CBC
Hemoglobin: 10.6 g/dL — ABNORMAL LOW (ref 13.0–17.0)
RDW: 20.3 % — ABNORMAL HIGH (ref 11.5–15.5)

## 2010-08-25 LAB — DIFFERENTIAL
Basophils Absolute: 0 10*3/uL (ref 0.0–0.1)
Lymphocytes Relative: 22 % (ref 12–46)
Monocytes Absolute: 0.5 10*3/uL (ref 0.1–1.0)
Neutro Abs: 3.7 10*3/uL (ref 1.7–7.7)

## 2010-08-26 ENCOUNTER — Encounter (HOSPITAL_COMMUNITY): Payer: BC Managed Care – PPO

## 2010-08-30 LAB — COMPREHENSIVE METABOLIC PANEL
Alkaline Phosphatase: 60 U/L (ref 39–117)
BUN: 21 mg/dL (ref 6–23)
CO2: 27 mEq/L (ref 19–32)
Chloride: 103 mEq/L (ref 96–112)
Glucose, Bld: 118 mg/dL — ABNORMAL HIGH (ref 70–99)
Potassium: 4 mEq/L (ref 3.5–5.1)
Total Bilirubin: 0.7 mg/dL (ref 0.3–1.2)

## 2010-08-30 LAB — CBC
HCT: 28.7 % — ABNORMAL LOW (ref 39.0–52.0)
Hemoglobin: 9.2 g/dL — ABNORMAL LOW (ref 13.0–17.0)
WBC: 8.4 10*3/uL (ref 4.0–10.5)

## 2010-08-30 LAB — DIFFERENTIAL
Basophils Absolute: 0 10*3/uL (ref 0.0–0.1)
Basophils Relative: 0 % (ref 0–1)
Neutro Abs: 7.1 10*3/uL (ref 1.7–7.7)
Neutrophils Relative %: 84 % — ABNORMAL HIGH (ref 43–77)

## 2010-08-31 ENCOUNTER — Ambulatory Visit: Payer: Self-pay | Admitting: Internal Medicine

## 2010-08-31 ENCOUNTER — Encounter (HOSPITAL_COMMUNITY): Payer: BC Managed Care – PPO

## 2010-09-02 ENCOUNTER — Encounter (HOSPITAL_COMMUNITY): Payer: BC Managed Care – PPO

## 2010-09-07 ENCOUNTER — Encounter (HOSPITAL_COMMUNITY): Payer: BC Managed Care – PPO

## 2010-09-09 ENCOUNTER — Encounter (HOSPITAL_COMMUNITY): Payer: BC Managed Care – PPO

## 2010-09-14 ENCOUNTER — Encounter (HOSPITAL_COMMUNITY): Payer: BC Managed Care – PPO | Attending: Internal Medicine

## 2010-09-14 DIAGNOSIS — I10 Essential (primary) hypertension: Secondary | ICD-10-CM | POA: Insufficient documentation

## 2010-09-14 DIAGNOSIS — E785 Hyperlipidemia, unspecified: Secondary | ICD-10-CM | POA: Insufficient documentation

## 2010-09-14 DIAGNOSIS — T59891A Toxic effect of other specified gases, fumes and vapors, accidental (unintentional), initial encounter: Secondary | ICD-10-CM | POA: Insufficient documentation

## 2010-09-14 DIAGNOSIS — R0902 Hypoxemia: Secondary | ICD-10-CM | POA: Insufficient documentation

## 2010-09-14 DIAGNOSIS — T59811A Toxic effect of smoke, accidental (unintentional), initial encounter: Secondary | ICD-10-CM | POA: Insufficient documentation

## 2010-09-15 ENCOUNTER — Encounter: Payer: Self-pay | Admitting: Internal Medicine

## 2010-09-16 ENCOUNTER — Encounter (HOSPITAL_COMMUNITY): Payer: BC Managed Care – PPO

## 2010-09-17 ENCOUNTER — Encounter: Payer: Self-pay | Admitting: Internal Medicine

## 2010-09-17 ENCOUNTER — Ambulatory Visit (INDEPENDENT_AMBULATORY_CARE_PROVIDER_SITE_OTHER): Payer: BC Managed Care – PPO | Admitting: Internal Medicine

## 2010-09-17 VITALS — BP 108/78 | HR 63 | Temp 98.2°F | Ht 73.0 in | Wt 228.4 lb

## 2010-09-17 DIAGNOSIS — R0602 Shortness of breath: Secondary | ICD-10-CM

## 2010-09-17 NOTE — Progress Notes (Signed)
Subjective:    Patient ID: [name], male    DOB: Jun 11, 1950, 60 y.o.   MRN: [id number]  HPI  History of Present Illness: 60 year old male. Sent by PMD because of hypoxemia discovered at PMD office on 04/13/2010 for routine physica.. States that even a year ago when in hospital for hiatal hernia repair o2 was low but never started on O2.  He does admit to dyspnea since 1991. Insidious onset. 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  Normal echo per chart review in dec 2009 except mild diast dysfn.  Dxde with large hiatal hernia with stomach in Abdomen on CT dec 2009. It was then suspected that dyspnea was due to hernia. s/p hernia repair and nissen fundoplication in May 2010.  However, since surgery dyspnea is worse.  Currently dyspneic for walking 100 feet, any strenous work at [employer].  Dyspnea improved by lying dow, relazing, sitting in wheel chair.  CXR 04/14/2010 shows new left diaph paralysis since Dec 2009.   He does admits to [employer] epigastric/lower chest pain that is atypical. Pain made worse by physicla pressure on spot or lifting. Pain improved by deep breaths. No associated radiation. Denies associated cough, wheezing, syncope, hemoptysis. Of note, his room air puse ox at rest was88% but after walking it improved to 94%. REC: FULL PFT CPST and SNIFF TEST   May 25, 2010: followup dyspnea. Returns to review test restults. 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 Full PFts were normal. Underwent CPST yesterday 05/24/2010. This was normal exccept for slight inadequate HR resposse due to beta blockade and mild dead space ventilation increase. Overall normal test though. He states though dyspnea persists unchanged. No new symptoms. REC: Pulmonary rehab  OV 09/17/2010: some dyspnea with humidity but denies it is asthma. Overall > 50% better with rehab that he has completed 5 weeks. Feels really well.  Denies all other complaints. Does not feel he has asthma. Denies wheeze, orthopnea, pnd, edema, fever, chills. Has intentionally lost 16# weight    Review of Systems   The patient denies shortness of breath at rest, productive cough, non-productive cough, coughing up blood, chest pain, irregular heartbeats, acid heartburn, indigestion, loss of appetite, abdominal pain, difficulty swallowing, sore throat, tooth/dental problems, headaches, nasal congestion/difficulty breathing through nose, sneezing, itching, ear ache, anxiety, depression, hand/feet swelling, joint stiffness or pain, rash, change in color of mucus, and fever.      Objective:   Physical Exam General:  well developed, well nourished, in no acute distress Head:  normocephalic and atraumatic Eyes:  PERRLA/EOM intact; conjunctiva and sclera clear Ears:  TMs intact and clear with normal canals Nose:  no deformity, discharge, inflammation, or lesions Mouth:  no deformity or lesions Neck:  no masses, thyromegaly, or abnormal cervical nodes Chest Wall:  no deformities noted Lungs:  clear bilaterally to auscultation and percussion Heart:  regular rate and rhythm, S1, S2 without murmurs, rubs, gallops, or clicks Abdomen:  bowel sounds positive; abdomen soft and non-tender without masses, or organomegaly Msk:  no deformity or scoliosis noted with normal posture Pulses:  pulses normal Extremities:  no clubbing, cyanosis, edema, or deformity noted Neurologic:  CN II-XII grossly intact with normal reflexes, coordination, muscle strength and tone Skin:  intact without lesions or rashes Cervical Nodes:  no significant adenopathy Axillary Nodes:  no significant adenopathy Psych:  alert and cooperative; normal mood and affect; normal attention span  and concentration        Assessment & Plan:

## 2010-09-17 NOTE — Patient Instructions (Addendum)
Glad you are better Monitor your shortness of breath over time  If you feel humidity or other issues make your shortness of breath worse, call us and we can re-evaluate The bike test did not show evidence of exercise asthma

## 2010-09-18 ENCOUNTER — Encounter: Payer: Self-pay | Admitting: Internal Medicine

## 2010-09-18 NOTE — Assessment & Plan Note (Signed)
Ex heavy passive smoker. Has chronic dyspnea on exertion with hyooxemia at rest. Dysnea is at class 2-3 level. On walking he does not sem to desaturate ad in fact pulse ox picked up suggesting underlying atelectasis related to raised left diaphragm. . 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 CPST test 05/24/2010 is normal but for mild reduction in HRresponse due to beta blocker and mild elevation in dead space possibly rlated to the left diaprhagm issue. There was no evidence of exercise induced bronchospasm on CPST. He comenced pulmonary rehab in March 2012 andd by May 2012 he had improved significantly with pulmnary rehab. This suggests deconditioning and weight issues and diaphragm as main causes of dyspnea. Given improvement I see no further need to followup here unless symptoms recur. He has agreed to this plan

## 2010-09-21 ENCOUNTER — Encounter (HOSPITAL_COMMUNITY): Payer: BC Managed Care – PPO

## 2010-09-23 ENCOUNTER — Encounter (HOSPITAL_COMMUNITY): Payer: BC Managed Care – PPO

## 2010-09-28 ENCOUNTER — Encounter (HOSPITAL_COMMUNITY): Payer: BC Managed Care – PPO

## 2010-09-28 NOTE — Discharge Summary (Signed)
NAMEDAVIUS, GOUDEAU NO.:  [id number]   MEDICAL RECORD NO.:  [id number]          PATIENT TYPE:  INP   LOCATION:  1531                         FACILITY:  [facility]   PHYSICIAN:  Lennie Muckle, MD      DATE OF BIRTH:  04-May-1951   DATE OF ADMISSION:  09/18/2008  DATE OF DISCHARGE:  09/21/2008                               DISCHARGE SUMMARY   DATE OF SURGERY:  Sep 18, 2008.   DIAGNOSIS:  Large paraesophageal Hernia.   PROCEDURE:  On Sep 18, 2008 laparoscopic paraesophageal hernia repair,  Nissen fundoplication, and lysis of adhesions.  Surgeon,  Bertram Savin,  MD.  Assistant:  Karie Soda.   HOSPITAL COURSE:  Mr. Anfinson is a very pleasant 60 year old male who had  a paraesophageal hernia.  He underwent surgical repair on May 6.  He has  done well following his repair.  I did get a gastrograph and swallow on  postop day #1 which showed no leak.  Hernia repair was intact.  He had  some mild nausea on the first day which eventually resolved.  He has had  pain well controlled with oxycodone elixir.  He has had no episodes of  shortness of breath. He has had no real difficulty with urination after  discontinuation of the Foley.  He is ambulating without difficulty and  tolerating a pureed diet.  He has been given a postoperative Nissen diet  to follow at home.  I have instructed him to perform no heavy lifting  greater than 20 pounds. He has been given oxycodone elixir at home for  pain.  I have given him a prescription #20 for Phenergan for nausea.  I  have instructed him if he develops fevers or chills, recurrent nausea,  to come to clinic earlier or I will see him back in 2 weeks' time.      Lennie Muckle, MD  Electronically Signed     ALA/MEDQ  D:  09/21/2008  T:  09/21/2008  Job:  [id number]   cc:   Barbette Hair. Arlyce Dice, MD,FACG  [address]  [place]  [place] [address]   Lucky Cowboy, M.D.  Fax: [phone]

## 2010-09-28 NOTE — Op Note (Signed)
NAMERYDGE, TEXIDOR                 ACCOUNT NO.:  [id number]   MEDICAL RECORD NO.:  [id number]          PATIENT TYPE:  AMB   LOCATION:  ENDO                         FACILITY:  [facility]   PHYSICIAN:  Barbette Hair. Arlyce Dice, MD,FACGDATE OF BIRTH:  10-12-1950   DATE OF PROCEDURE:  DATE OF DISCHARGE:                               OPERATIVE REPORT   PROCEDURE:  Esophageal manometry.   Esophageal manometry was performed in the usual pullback technique.   FINDINGS:  1. Resting LES pressure was 15.1 mm.  Residual pressure was 4.1 mm and      percent relaxation was 74%.  2. There were normal peristaltic contractions throughout the      esophageal body.  Peristaltic contractions comprised 100%.  3. Upper esophageal pressure, contractions and relaxation were normal.   IMPRESSION:  Normal esophageal manometry.      Barbette Hair. Arlyce Dice, MD,FACG  Electronically Signed     RDK/MEDQ  D:  06/27/2008  T:  06/27/2008  Job:  [id number]   cc:   Lennie Muckle, MD  [address]   [address]  [place] [address]

## 2010-09-28 NOTE — Op Note (Signed)
[name], [name]                 ACCOUNT NO.:  [id number]   MEDICAL RECORD NO.:  [id number]          PATIENT TYPE:  INP   LOCATION:  0001                         FACILITY:  [facility]   PHYSICIAN:  Lennie Muckle, MD      DATE OF BIRTH:  1951-01-21   DATE OF PROCEDURE:  09/18/2008  DATE OF DISCHARGE:                               OPERATIVE REPORT   PREOPERATIVE DIAGNOSIS:  Paraesophageal hernia.   POSTOPERATIVE DIAGNOSIS:  Paraesophageal hernia.   PROCEDURES:  1. Laparoscopic paraesophageal hernia repair, large.  2. Nissen fundoplication.  3. Lysis adhesions approximately thirty minutes.   SURGEON:  Amber L. Freida Busman, MD.   ASSISTANT:  Estelle Grumbles.   General endotracheal anesthesia.   FINDINGS:  Large paraesophageal hernia with stomach malrotated in the  chest.  Adhesions to abdominal wall from previous hernia repair.  No  immediate complications.  No drains.  No specimens were sent.   BLOOD LOSS:  Approximately 100 mL.   INDICATIONS FOR PROCEDURE:  [name] is a 60 year old male whom I had  seen for complaints of abdominal pain.  He had some regurgitation of  food substances and had been on Prilosec daily.  He had some mild  shortness of breath but mostly was complaining of abdominal pain.  He  had a full evaluation preoperatively with a CT scan revealing a large  hiatal hernia with some rotation of the stomach.  He also had an upper  GI series which revealed a large hernia, manometry had been normal and  endoscopy had revealed the large hernia with partial volvulus.  He was  not obstructed and did not appear to have compromise of stomach emptying  or ischemia to require urgent repair.  He had received cardiac workup  for clearance prior to his procedure, which was cleared, and informed  consent was obtained from the patient after discussing full risks  including open repair, injury to the stomach, esophagus, other organs.   DETAILS OF PROCEDURE:  [name] was identified  in the preoperative  holding area, he received 2 grams of cefoxitin, was taken to the  operating room.  Once in the operating room, placed in the supine  position.  A Foley catheter was placed after administration of general  endotracheal anesthesia.  He was placed on a split-leg table and  positioned appropriately.  His abdomen was then clipped, prepped and  draped in the usual sterile fashion.  A time-out procedure, indicating  the patient and procedure, was performed.  I placed an incision  approximately midway between the xiphoid and the umbilicus.  A 5-mm  trocar was placed in the abdominal cavity with the Optiview.  All layers  of abdominal wall were visualized upon entry.  Inspection of the abdomen  after obtaining pneumoperitoneum revealed no evidence of injury upon  placement of the trocar.  There did appear to be omental adhesions to  the abdominal wall in the vicinity of placing the trocar.  We were able  to place the port further into the abdomen in a free area to  place  another 5-mm  trocar on the left side. This was placed safely and the  camera was moved into this port.  We were  able to better visualize the  omental adhesion to the abdominal wall.  The trocar was in the middle of  the omental adhesion.  It seemed adhered to the mesh which had been  placed for his hernia repair.  I then placed another 5-mm trocar on the  left side of the abdomen. Using the Harmonic scalpel the adhesions were  freed from the abdominal wall.  This took approximately 30 minutes to  clear out the area around the trocar and clear the abdominal wall.  I  placed an 11-mm trocar on the left side of the abdomen.  A liver  retractor was placed in the midline superiorly.  This held up the left  lobe of the liver.  An additional 5-mm trocar was placed in the  patient's right side of the abdomen.  We began by gently pulling hernia  from the chest.  The stomach seemd to come easily from the chest.  To   begin dissecting the hernia sac from the stomach I chose an area around  the lesser curve. I continued along the lesser curve up to the right  crus. I continued dissecting the hernia sac from the chest and attempted  to go as high as possible.  I then proceeded anteriorly along the hernia  sac, releasing this from the thoracic cavity.  We then came around  posteriorly on the left side of the crus.  After we had dissected the  lesser curve, then moved onto the greater curve to take down the short  gastrics.  Using a Harmonic scalpel I divided the short gastrics,  carrying me all the way up to the left crus.  I was able to reduce the  stomach from the thoracic cavity without difficulty.  I continued  dissecting the hernia sac posteriorly up through the chest.  I then  began dissecting the hernia sac away from the stomach on the lesser  curve.  I was able to fully dissect this way into the thoracic cavity.  I transected this and removed this from the 11-mm trocar site.  I then  continued dissecting the hernia sac away from the stomach and the  esophagus anteriorly.  Care was taken to stay a safe distance far away  from the esophagus using the Harmonic scalpel.  There was a second  portion of the hernia sac posteriorly which I continued dissecting with  the Harmonic scalpel.  I was able to again retract the GE junction into  the abdominal cavity and dissect posteriorly to fully clean the left and  right crus.  I then continued dissecting up into the thoracic cavity so  that I gained enough length on the esophagus.  This did seem to lay  appropriately into the abdominal cavity without tension.  There were  some adhesions on the stomach to the caudate lobe which I took with the  Harmonic scalpel in order for the stomach to lay naturally.  Fully  dissecting the hernia sac away from the stomach allowed me to better  visualize the proper orientation of the stomach.  After we had fully  dissected  the hernia sac away from the stomach and the esophagus, I  began my closure of the crura.  I used pledgeted Endo stitches and  placed approximately 5 sutures to close up the hernia site.  There  seemed to be no  tension upon the closure.  I, therefore, placed a  Penrose around the GE junction and was able to bring the posterior  portion of the stomach around the esophagus to create the Nissen wrap.  At that time, Dr. Okey Dupre, from anesthesiology, placed the number 56 bougie  into the esophagus.  We watched this come into the stomach and went  easily without difficulty.  Once this was in place, I performed a wrap  using the Endo Stitch.  I placed approximately 4 sutures and did place  several small bites of the esophagus for the wrap.  Final size of the  wrap was approximately 3 cm in size.  This did sit well below the GE  junction well below the crus.  I then tacked the stomach posteriorly and  anteriorly using an Endo stitch.  Final inspection revealed good closure  and a nice floppy wrap.  The bougie was removed without difficulty, no  blood noted.  Irrigation of the abdomen revealed no injuries and no  bleeding.  I then removed the liver retractor.  I closed the 11-mm  trocar site fascia using a 0 Vicryl suture and the laparoscopic suture  passer.  Skin was closed with 4-0 Monocryl, Dermabond was placed final  dressing.  The patient was extubated, transferred to the post anesthesia  care unit in stable condition.  He will be monitored the next few days,  will be started on the post Nissen diet, I will keep him on his Prilosec  at home, keep him on Protonix during the hospital and will start him on  oxycodone elixir for pain.      Lennie Muckle, MD  Electronically Signed     ALA/MEDQ  D:  09/18/2008  T:  09/18/2008  Job:  [id number]   cc:   Barbette Hair. Arlyce Dice, MD,FACG  [address]  [place]  [place] [address]   Lucky Cowboy, M.D.  Fax: [phone]

## 2010-09-28 NOTE — Assessment & Plan Note (Signed)
[facility]                            CARDIOLOGY OFFICE NOTE   SONG, GARRIS                        MRN:          [id number]  DATE:05/12/2008                            DOB:          1951-04-19    A 59 year old patient we were asked to see for preop clearance.  The  patient has hypertension and hypercholesterolemia.  He has been having  some exertional dyspnea.  This has been going on since October.  He  usually works at [employer].  He needs hiatal hernia repair.   The patient's dyspnea seems functional.  He does describe a heaviness in  his chest, which is likely related to his hiatal hernia.  The patient's  dyspnea has not been progressive, but he states that he has been more  short of breath, I suspected has something to do with his hiatal hernia  encroaching on his lung bases.   The patient quit smoking in 1970.  Has no evidence of previous chronic  lung disease.  She said he had a chest x-ray done in November and was  fine.  His CT chest showed a large hiatal hernia with almost entirely  stomach in the thoracic cavity.  The patient had no evidence of primary  lung problems on this CT of the chest.   The patient has not had a cough or fever.  He has not had exertional  chest pain, palpitations, PND, or orthopnea.   REVIEW OF SYSTEMS:  Otherwise negative.   PAST MEDICAL HISTORY:  Remarkable for previous chest pain with a normal  Myoview in 2007.  He has exertional dyspnea, which was discussed.  Coronary risk factors include hypercholesterolemia and hypertension.   SOCIAL HISTORY:  The patient works at [employer] on [employer].  He does  facility service work, essentially cleaning break rooms and bathrooms.  The patient is widowed.  He does not have any children.   PREVIOUS SURGERIES:  Of all involve hernias primarily in the groin and 1  umbilical hernia.  These were done in 1992, 2000, and 2004.  He had no  abnormal complications from  these.   REVIEW OF SYSTEMS:  Otherwise remarkable for depression, seasonal  allergies, and constipation.   FAMILY HISTORY:  Remarkable for mother dying of colon cancer at age 45.  Father dying of prostate cancer at age 84.   PHYSICAL EXAMINATION:  GENERAL:  Remarkable for a middle-aged male, in  no distress.  VITAL SIGNS:  His weight is 234, blood pressure 120/82, pulse 70 and  regular, respiratory rate 14, and afebrile.  HEENT:  Unremarkable.  NECK:  Carotids are normal without bruit.  No lymphadenopathy,  thyromegaly, or JVP elevation.  LUNGS:  Clear, good diaphragmatic motion.  No wheezing.  S1 and S2.  Normal heart sounds.  PMI normal.  ABDOMEN:  Benign.  Bowel sounds positive.  No AAA.  No tenderness, no  bruit.  No hepatosplenomegaly or hepatojugular reflux.  EXTREMITIES:  Distal pulses were intact.  No edema.  NEUROLOGIC:  Nonfocal.  SKIN:  Warm and dry.  No muscular weakness.  He is  status post bilateral  inguinal hernia repair.   His EKG is totally normal.   IMPRESSION:  1. In regards to preoperative clearance, the patient has had previous      hernia surgeries without complications.  He had a normal Myoview in      2007.  He has a normal exam and a normal EKG.  I do not think, he      need a stress test to clear him for surgery.  2. In regards to his dyspnea, we will do a 2D echocardiogram to make      sure he has normal right ventricular and left ventricular function,      I suspect it is functional related to his hiatal hernia.  3. Hypercholesterolemia.  Continue pravastatin.  Lipid and liver      profile per primary care MD.  4. Hypertension, currently well controlled.  Continue current dose of      atenolol and doxazosin.  5. Anxiety and depression.  Continue citalopram.  Overall, mood seems      to be reasonable.   I suspect so long as the patient's echo shows normal LV function, he  will be able to be cleared for hernia surgery as seems possible.      Noralyn Pick. Eden Emms, MD, [facility]  Electronically Signed    PCN/MedQ  DD: 05/12/2008  DT: 05/12/2008  Job #: [id number]   cc:   Lucky Cowboy, M.D.  Lennie Muckle, MD

## 2010-09-30 ENCOUNTER — Telehealth: Payer: Self-pay | Admitting: Internal Medicine

## 2010-09-30 ENCOUNTER — Encounter (HOSPITAL_COMMUNITY)
Admission: RE | Admit: 2010-09-30 | Discharge: 2010-09-30 | Disposition: A | Discharge status: Home / Self Care | Payer: BC Managed Care – PPO | Source: Ambulatory Visit | Attending: General Surgery | Admitting: General Surgery

## 2010-09-30 ENCOUNTER — Ambulatory Visit (HOSPITAL_COMMUNITY)
Admission: RE | Admit: 2010-09-30 | Discharge: 2010-09-30 | Disposition: A | Discharge status: Home / Self Care | Payer: BC Managed Care – PPO | Source: Ambulatory Visit | Attending: General Surgery | Admitting: General Surgery

## 2010-09-30 ENCOUNTER — Other Ambulatory Visit (INDEPENDENT_AMBULATORY_CARE_PROVIDER_SITE_OTHER): Payer: Self-pay | Admitting: General Surgery

## 2010-09-30 ENCOUNTER — Encounter (HOSPITAL_COMMUNITY): Payer: BC Managed Care – PPO

## 2010-09-30 DIAGNOSIS — Z01810 Encounter for preprocedural cardiovascular examination: Secondary | ICD-10-CM | POA: Insufficient documentation

## 2010-09-30 DIAGNOSIS — Z01811 Encounter for preprocedural respiratory examination: Secondary | ICD-10-CM

## 2010-09-30 DIAGNOSIS — Z01812 Encounter for preprocedural laboratory examination: Secondary | ICD-10-CM | POA: Insufficient documentation

## 2010-09-30 DIAGNOSIS — Z01818 Encounter for other preprocedural examination: Secondary | ICD-10-CM | POA: Insufficient documentation

## 2010-09-30 LAB — DIFFERENTIAL
Basophils Absolute: 0 K/uL (ref 0.0–0.1)
Basophils Relative: 0 % (ref 0–1)
Eosinophils Absolute: 0.2 K/uL (ref 0.0–0.7)
Eosinophils Relative: 3 % (ref 0–5)
Lymphocytes Relative: 30 % (ref 12–46)
Lymphs Abs: 2 K/uL (ref 0.7–4.0)
Monocytes Absolute: 0.6 K/uL (ref 0.1–1.0)
Monocytes Relative: 9 % (ref 3–12)
Neutro Abs: 3.9 K/uL (ref 1.7–7.7)
Neutrophils Relative %: 58 % (ref 43–77)

## 2010-09-30 LAB — CBC
HCT: 42.6 % (ref 39.0–52.0)
Hemoglobin: 14.9 g/dL (ref 13.0–17.0)
MCH: 31.6 pg (ref 26.0–34.0)
MCHC: 35 g/dL (ref 30.0–36.0)
MCV: 90.4 fL (ref 78.0–100.0)
Platelets: 159 K/uL (ref 150–400)
RBC: 4.71 MIL/uL (ref 4.22–5.81)
RDW: 12.2 % (ref 11.5–15.5)
WBC: 6.7 K/uL (ref 4.0–10.5)

## 2010-09-30 LAB — BASIC METABOLIC PANEL
Calcium: 11 mg/dL — ABNORMAL HIGH (ref 8.4–10.5)
Creatinine, Ser: 1.26 mg/dL (ref 0.4–1.5)
GFR calc Af Amer: >60 mL/min (ref >60)
GFR calc non Af Amer: 58 mL/min — ABNORMAL LOW (ref >60)
Glucose, Bld: 106 mg/dL — ABNORMAL HIGH (ref 70–99)
Sodium: 140 mEq/L (ref 135–145)

## 2010-09-30 NOTE — Telephone Encounter (Signed)
Faxed LOV & Stress to [employer] (per [place]) @ [facility] ([phone]).

## 2010-10-01 NOTE — Consult Note (Signed)
   NAMEDORON, SHAKE                           ACCOUNT NO.:  [id number]   MEDICAL RECORD NO.:  [id number]                   PATIENT TYPE:  OBV   LOCATION:  0456                                 FACILITY:  [facility]   PHYSICIAN:  Winner C. Vernie Ammons, M.D.               DATE OF BIRTH:  17-Dec-1950   DATE OF CONSULTATION:  04/26/2002  DATE OF DISCHARGE:                                   CONSULTATION   REASON FOR CONSULTATION:  The patient is a 60 year old white male who I was  asked to see regarding Foley catheter placement.  Several attempts have been  made to place a Foley catheter today, and were unsuccessful.  He had a  laparoscopic hernia repair and was unable to void.  He does have a history  of benign prostatic hypertrophy and bladder outlet obstruction in the past,  but no prior GU manipulation, history of retention, or prior history of  stricture disease.   PAST MEDICAL HISTORY:  1. Hypertension.  2. Vertigo.  3. Reflux.   PAST SURGICAL HISTORY:  1. Left inguinal hernia repair.  2. Umbilical hernia repair.   MEDICATIONS:  1. Atenolol.  2. Meclizine.  3. Zoloft.  4. Allegra.  5. Doxazocin 8 mg h.s.  6. Mobic.  7. Zocor.  8. Vitamin E.  9. Multivitamin.   ALLERGIES:  No known drug allergies.   SOCIAL HISTORY:  Noncontributory.   FAMILY HISTORY:  Noncontributory.   PHYSICAL EXAMINATION:  GENERAL:  The patient is a well-developed, well-  nourished white male in no acute distress.  ABDOMEN:  Soft, but slightly tender due to the previous laparoscopy.  No  inguinal hernias or adenopathy.  GENITOURINARY:  Normal circumcised phallus without lesions or discharge.  Normal glans, meatus, scrotum, testicles, epididymis, anus, and perineum.   Lidocaine 2% jelly was introduced into the urinary tract after sterile  prepping and draping.  An 64 French Foley catheter was then placed with no  difficulty into the bladder with clear urine returning.   IMPRESSION:  1. Benign  prostatic hypertrophy with bladder outlet obstruction managed with     Cardura.  2. Difficult Foley catheter placement.   PLAN:  1. Leave Foley catheter in overnight.  2. Remove catheter in the morning for a voiding trial.  3. Continue Doxazocin at this time.                                               Gaberial C. Vernie Ammons, M.D.    MCO/MEDQ  D:  04/26/2002  T:  04/26/2002  Job:  [id number]

## 2010-10-01 NOTE — Op Note (Signed)
New Site. [facility]  Patient:    [name], [name]                        MRN: [id number] Proc. Date: 03/09/00 Adm. Date:  [id number] Disc. Date: [id number] Attending:  Sonda Primes CC:         Mardene Celeste. Lurene Shadow, M.D.   Operative Report  PREOPERATIVE DIAGNOSIS:  Incarcerated umbilical hernia.  POSTOPERATIVE DIAGNOSIS:  Incarcerated umbilical hernia.  OPERATION PERFORMED:  Repair of incarcerated umbilical hernia.  SURGEON:  Mardene Celeste. Lurene Shadow, M.D.  ASSISTANT:  Nurse.  ANESTHESIA: General.  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  This has been causing some severe pain and he is brought to the operating room now for repair of this umbilical hernia.  DESCRIPTION OF PROCEDURE:  Following induction of anesthesia, the patient was positioned supinely.  The abdomen was prepped and draped to be included in the sterile operative field.  A circumumbilical incision was carried down over the superior portion of the umbilicus.  This was deepened through the skin and subcutaneous tissue.  The umbilicus was raised inferiorly as a flap and deepened through the skin so as to dissect the umbilical skin away from the sac.  The sac was carried down to the fascia and the fascia was opened. Incarcerated within the sac was a portion of the falciform ligament.  This was removed with clamps and secured with ties of 2-0 silk.  The defect which was rather small was easily closed without tension with interrupted 0 Novofil sutures.  Sponge, instrument and sharp counts were verified.  The subcutaneous tissues were closed with interrupted 2-0 and 3-0 Vicryl sutures and the skin was closed with a 4-0 Monocryl.  Sterile compressive dressing applied. Anesthetic reversed.  Patient removed from the operating room to the recovery room in stable condition  having tolerated the procedure well. DD:  03/09/00 TD:  03/09/00 Job: [id number]

## 2010-10-01 NOTE — Op Note (Signed)
[name], [name]                           ACCOUNT NO.:  [id number]   MEDICAL RECORD NO.:  [id number]                   PATIENT TYPE:  AMB   LOCATION:  DAY                                  FACILITY:  [facility]   PHYSICIAN:  Abigail Miyamoto, M.D.              DATE OF BIRTH:  1951-03-15   DATE OF PROCEDURE:  04/25/2002  DATE OF DISCHARGE:                                 OPERATIVE REPORT   PREOPERATIVE DIAGNOSIS:  Ventral hernia.   POSTOPERATIVE DIAGNOSIS:  Ventral hernia.   PROCEDURE:  Laparoscopic ventral hernia repair with mesh.   SURGEON:  Abigail Miyamoto, M.D.   ANESTHESIA:  General endotracheal anesthesia and 0.25% Marcaine.   FINDINGS:  The patient was found to have a hernia in the epigastric area  containing only fat in the defect.  It was repaired with a 12 x 12 cm piece  of Parietex mesh.   DESCRIPTION OF PROCEDURE:  The patient was brought to the operating room and  positively identified.  He was placed supine upon the operating room table,  and general endotracheal anesthesia was induced.  His abdomen was then  prepped and draped in the usual sterile fashion.  Using a #15 blade, a small  transverse incision was made in the patient's right lower flank.  The  incision was carried down through the fascia with the electrocautery.  The  muscle layers were then divided bluntly.  The peritoneum was identified and  opened.  Next a 0 Vicryl pursestring suture was placed around the fascial  opening.  The Hasson port was placed through the opening and insufflation of  the abdomen was begun.  Upon exploring the abdomen, the patient was indeed  found to have an epigastric hernia with some adhesions of omentum to this.  A 5 mm port was then placed under direct vision on the right upper quadrant  as well as in the left lower quadrant.  The adhesions were taken down both  bluntly and with the electrocautery.  The falciform ligament had to be taken  down also with the  electrocautery.  The preperitoneal fat contained within  the hernia sac was reduced and excised with electrocautery and removed  through the right lower quadrant port as well.  The edges of the fascial  defect were then measured.  A 12 x 12 piece of Parietex mesh was then  brought onto the field.  Four separate 0 Novofil sutures were then placed in  the four corners of the mesh.  The mesh was then placed through the camera  port into the abdominal cavity.  The mesh was then unrolled under direct  vision.  Four separate skin incisions were then made around boundaries of  the hernia defect approximately 3 cm from each defect circumferentially.  The suture passer was then used under direct vision to pull each of the  pieces of Novofil up to the abdominal  wall.  All four ends of Novofil were  then pulled through the abdominal wall and then sutured tightly, pulling the  mesh up to the abdominal wall.  A surgical tacker was then used to tack the  mesh in circumferentially.  Excellent coverage of the hernia defect appeared  to be achieved.  At this point hemostasis was again re-examined and was felt  to be achieved.  All ports were then removed under direct vision, and the  abdomen was deflated.  The mesh appeared to lay appropriately as the abdomen  was deflated.  The 0 Vicryl at the right lower quadrant port was then tied  in place, closing the fascia.  All incisions were then anesthetized with  0.25% Marcaine  and closed with 4-0 Monocryl subcuticular sutures.  Steri-Strips, gauze, and  tape were then applied.  The patient tolerated the procedure well.  All  sponge, needle, and instrument counts were correct at the end of the  procedure.  The patient was then extubated in the operating room and taken  in stable condition to the recovery room.                                               Abigail Miyamoto, M.D.    DB/MEDQ  D:  04/25/2002  T:  04/25/2002  Job:  [id number]

## 2010-10-05 ENCOUNTER — Encounter (HOSPITAL_COMMUNITY): Payer: BC Managed Care – PPO

## 2010-10-06 ENCOUNTER — Ambulatory Visit (HOSPITAL_COMMUNITY)
Admission: RE | Admit: 2010-10-06 | Discharge: 2010-10-06 | Disposition: A | Discharge status: Home / Self Care | Payer: BC Managed Care – PPO | Source: Ambulatory Visit | Attending: General Surgery | Admitting: General Surgery

## 2010-10-06 DIAGNOSIS — K660 Peritoneal adhesions (postprocedural) (postinfection): Secondary | ICD-10-CM | POA: Insufficient documentation

## 2010-10-06 DIAGNOSIS — I10 Essential (primary) hypertension: Secondary | ICD-10-CM | POA: Insufficient documentation

## 2010-10-06 DIAGNOSIS — R109 Unspecified abdominal pain: Secondary | ICD-10-CM | POA: Insufficient documentation

## 2010-10-07 ENCOUNTER — Encounter (HOSPITAL_COMMUNITY): Payer: BC Managed Care – PPO

## 2010-11-01 NOTE — Op Note (Signed)
[name], [name] NO.:  [id number]  MEDICAL RECORD NO.:  [id number]           PATIENT TYPE:  O  LOCATION:  REHS                         FACILITY:  [facility]  PHYSICIAN:  Tony Muckle, MD      DATE OF BIRTH:  1950-11-14  DATE OF PROCEDURE:  10/06/2010 DATE OF DISCHARGE:                              OPERATIVE REPORT   PREOPERATIVE DIAGNOSIS:  Incisional hernia.  POSTOPERATIVE DIAGNOSIS:  Abdominal wall pain.  SURGEON:  Tony Myles L. Freida Busman, MD  PROCEDURES:  Diagnostic laparoscopy and lysis of adhesions.  ASSISTANT:  OR staff.  General endotracheal anesthesia.  FINDINGS:  Omental adhesion to the abdominal wall from the previous mesh placement.  No ascertainable abdominal wall hernia.  No specimens.  Minimal amount of blood loss.  INDICATIONS FOR PROCEDURE:  [name] is a 60 year old male whom I had previously performed repair of a paraesophageal hernia on June 27, 2008.  He was doing well.  However, he states he had acute onset of epigastric discomfort on his cath attempt on his stomach.  Examination seemed consistent with a possible hernia superiorly.  He had undergone ventral hernia repair by Dr. Magnus Ivan several years ago.  I felt that he probably had an incisional hernia and felt he probably need laparoscopic repair.  Therefore, he was seen preoperatively by Dr. Milinda Cave and Dr. Marchelle Gearing cleared for surgery.  DETAILS OF PROCEDURE:  The patient was identified in the preoperative holding area, seen by Anesthesia, given IV antibiotics and taken to the operating room.  Once in the operating room, he was placed in supine position.  After administration of general endotracheal anesthesia, sequential compression device were applied to his lower extremity.  A Foley catheter was placed.  His abdomen was clipped, prepped, and draped in usual sterile fashion.  Surgical time-out performed.  A Foley catheter was placed.  Surgical time-out performed after prep  and drape the abdomen.  I placed an incision in the right side of the abdomen. Using 5 mm camera and OptiVu, placed a trocar into abdominal cavity. All layers of abdominal wall was visualized upon entry.  After insufflating the abdomen, I inspected the abdomen, found no evidence of injury on placement of the trocar.  I inspected the abdomen and found omental adhesions to the midline.  I placed an additional 5-mm trocar in the right lower abdomen under visualization with the camera.  The mesh was visualized against the abdominal wall.  I was lateral to this. Using laparoscopic scissors and sharp dissection, I dissected the omentum away from the mesh.  The electrocautery was used to control small amount of oozing around the mesh.  Once the omentum was fully released, I visualized the omentum.  The bowel was without evidence of injury.  There was minimal amount of oozing from the mesh.  I used electrocautery for this.  I closely inspected the abdominal wall.  I found no evidence of a hernia site.  The mesh was intact over the site of discomfort which I had marked in the preoperative holding area. There was a slight prominence which was likely subcutaneous fat or  likely to be from previous adipose tissue from surgery.  I used a blunt grasper to palpate the abdominal wall along with my hand on opposite side.  I was confident there was no hernia defect in the vicinity.  I therefore examined the omentum, opened the omentum due to a small rent in this.  There was no bleeding from using the laparoscopic scissors. Once again I inspected the bowel and found no evidence of injury.  I then did 1 final inspection for bleeding and found no bleeding or injury.  I then released pneumoperitoneum, removed the trocars, 17 mL of 0.25% Marcaine used for local anesthetic.  The patient's skin was closed with 4-0 Monocryl and Vicryl.  Dermabond was placed for final dressing. He was then extubated, transferred to  Postanesthesia Care Unit in stable condition, monitor tonight and likely discharge home tomorrow.     Tony Muckle, MD     ALA/MEDQ  D:  10/06/2010  T:  10/07/2010  Job:  [id number]  Electronically Signed by Tony Savin MD on 11/01/2010 12:13:24 PM

## 2010-11-02 ENCOUNTER — Encounter (HOSPITAL_COMMUNITY): Payer: BC Managed Care – PPO | Attending: Internal Medicine

## 2010-11-02 DIAGNOSIS — T59891A Toxic effect of other specified gases, fumes and vapors, accidental (unintentional), initial encounter: Secondary | ICD-10-CM | POA: Insufficient documentation

## 2010-11-02 DIAGNOSIS — R0902 Hypoxemia: Secondary | ICD-10-CM | POA: Insufficient documentation

## 2010-11-02 DIAGNOSIS — E785 Hyperlipidemia, unspecified: Secondary | ICD-10-CM | POA: Insufficient documentation

## 2010-11-02 DIAGNOSIS — I10 Essential (primary) hypertension: Secondary | ICD-10-CM | POA: Insufficient documentation

## 2010-11-02 DIAGNOSIS — T59811A Toxic effect of smoke, accidental (unintentional), initial encounter: Secondary | ICD-10-CM | POA: Insufficient documentation

## 2010-11-04 ENCOUNTER — Ambulatory Visit (HOSPITAL_COMMUNITY): Payer: BC Managed Care – PPO

## 2010-11-09 ENCOUNTER — Encounter (HOSPITAL_COMMUNITY): Payer: BC Managed Care – PPO

## 2010-11-11 ENCOUNTER — Encounter (HOSPITAL_COMMUNITY): Payer: BC Managed Care – PPO

## 2010-11-16 ENCOUNTER — Encounter (HOSPITAL_COMMUNITY): Payer: BC Managed Care – PPO | Attending: Internal Medicine

## 2010-11-16 DIAGNOSIS — T59891A Toxic effect of other specified gases, fumes and vapors, accidental (unintentional), initial encounter: Secondary | ICD-10-CM | POA: Insufficient documentation

## 2010-11-16 DIAGNOSIS — I10 Essential (primary) hypertension: Secondary | ICD-10-CM | POA: Insufficient documentation

## 2010-11-16 DIAGNOSIS — R0902 Hypoxemia: Secondary | ICD-10-CM | POA: Insufficient documentation

## 2010-11-16 DIAGNOSIS — E785 Hyperlipidemia, unspecified: Secondary | ICD-10-CM | POA: Insufficient documentation

## 2010-11-16 DIAGNOSIS — T59811A Toxic effect of smoke, accidental (unintentional), initial encounter: Secondary | ICD-10-CM | POA: Insufficient documentation

## 2010-11-18 ENCOUNTER — Encounter (HOSPITAL_COMMUNITY): Payer: BC Managed Care – PPO

## 2010-11-23 ENCOUNTER — Encounter (HOSPITAL_COMMUNITY): Payer: BC Managed Care – PPO

## 2010-11-25 ENCOUNTER — Encounter (HOSPITAL_COMMUNITY): Payer: BC Managed Care – PPO

## 2010-11-30 ENCOUNTER — Encounter (HOSPITAL_COMMUNITY): Payer: BC Managed Care – PPO

## 2010-12-02 ENCOUNTER — Encounter (HOSPITAL_COMMUNITY): Payer: BC Managed Care – PPO

## 2010-12-07 ENCOUNTER — Encounter (HOSPITAL_COMMUNITY): Payer: BC Managed Care – PPO

## 2010-12-09 ENCOUNTER — Encounter (HOSPITAL_COMMUNITY): Payer: BC Managed Care – PPO

## 2010-12-14 ENCOUNTER — Encounter (HOSPITAL_COMMUNITY): Payer: BC Managed Care – PPO

## 2010-12-16 ENCOUNTER — Encounter (HOSPITAL_COMMUNITY): Payer: BC Managed Care – PPO

## 2011-01-21 ENCOUNTER — Emergency Department (HOSPITAL_COMMUNITY)
Admission: EM | Admit: 2011-01-21 | Discharge: 2011-01-21 | Disposition: A | Discharge status: Home / Self Care | Payer: BC Managed Care – PPO | Attending: Emergency Medicine | Admitting: Emergency Medicine

## 2011-01-21 ENCOUNTER — Emergency Department (HOSPITAL_COMMUNITY): Payer: BC Managed Care – PPO

## 2011-01-21 DIAGNOSIS — E789 Disorder of lipoprotein metabolism, unspecified: Secondary | ICD-10-CM | POA: Insufficient documentation

## 2011-01-21 DIAGNOSIS — G510 Bell's palsy: Secondary | ICD-10-CM | POA: Insufficient documentation

## 2011-01-21 DIAGNOSIS — Z79899 Other long term (current) drug therapy: Secondary | ICD-10-CM | POA: Insufficient documentation

## 2011-01-21 DIAGNOSIS — R209 Unspecified disturbances of skin sensation: Secondary | ICD-10-CM | POA: Insufficient documentation

## 2011-01-21 DIAGNOSIS — R29810 Facial weakness: Secondary | ICD-10-CM | POA: Insufficient documentation

## 2011-01-21 DIAGNOSIS — Z7982 Long term (current) use of aspirin: Secondary | ICD-10-CM | POA: Insufficient documentation

## 2011-01-21 DIAGNOSIS — I10 Essential (primary) hypertension: Secondary | ICD-10-CM | POA: Insufficient documentation

## 2011-01-21 LAB — PROTIME-INR: Prothrombin Time: 14.4 seconds (ref 11.6–15.2)

## 2011-01-21 LAB — CBC
MCH: 31.3 pg (ref 26.0–34.0)
MCHC: 34.7 g/dL (ref 30.0–36.0)
MCV: 90.2 fL (ref 78.0–100.0)
Platelets: 209 10*3/uL (ref 150–400)
RBC: 3.96 MIL/uL — ABNORMAL LOW (ref 4.22–5.81)
RDW: 12.6 % (ref 11.5–15.5)

## 2011-01-21 LAB — COMPREHENSIVE METABOLIC PANEL
ALT: 20 U/L (ref 0–53)
BUN: 25 mg/dL — ABNORMAL HIGH (ref 6–23)
CO2: 32 mEq/L (ref 19–32)
Calcium: 10.4 mg/dL (ref 8.4–10.5)
Creatinine, Ser: 1.29 mg/dL (ref 0.50–1.35)
GFR calc Af Amer: >60 mL/min (ref >60)
GFR calc non Af Amer: 56 mL/min — ABNORMAL LOW (ref >60)
Glucose, Bld: 118 mg/dL — ABNORMAL HIGH (ref 70–99)
Sodium: 141 mEq/L (ref 135–145)
Total Protein: 7 g/dL (ref 6.0–8.3)

## 2011-01-21 LAB — DIFFERENTIAL
Basophils Relative: 0 % (ref 0–1)
Eosinophils Absolute: 0.3 10*3/uL (ref 0.0–0.7)
Eosinophils Relative: 3 % (ref 0–5)
Lymphs Abs: 1.2 10*3/uL (ref 0.7–4.0)
Monocytes Relative: 7 % (ref 3–12)
Neutrophils Relative %: 75 % (ref 43–77)

## 2013-01-03 ENCOUNTER — Encounter (HOSPITAL_COMMUNITY): Payer: Self-pay | Admitting: *Deleted

## 2013-01-03 ENCOUNTER — Observation Stay (HOSPITAL_COMMUNITY)
Admission: AD | Admit: 2013-01-03 | Discharge: 2013-01-04 | Disposition: A | Discharge status: Home / Self Care | Payer: BC Managed Care – PPO | Source: Ambulatory Visit | Attending: Urology | Admitting: Urology

## 2013-01-03 DIAGNOSIS — N189 Chronic kidney disease, unspecified: Secondary | ICD-10-CM | POA: Diagnosis present

## 2013-01-03 DIAGNOSIS — N401 Enlarged prostate with lower urinary tract symptoms: Secondary | ICD-10-CM

## 2013-01-03 DIAGNOSIS — N390 Urinary tract infection, site not specified: Secondary | ICD-10-CM | POA: Insufficient documentation

## 2013-01-03 DIAGNOSIS — R339 Retention of urine, unspecified: Secondary | ICD-10-CM | POA: Insufficient documentation

## 2013-01-03 DIAGNOSIS — M899 Disorder of bone, unspecified: Secondary | ICD-10-CM | POA: Insufficient documentation

## 2013-01-03 DIAGNOSIS — I10 Essential (primary) hypertension: Secondary | ICD-10-CM | POA: Insufficient documentation

## 2013-01-03 DIAGNOSIS — N138 Other obstructive and reflux uropathy: Secondary | ICD-10-CM | POA: Diagnosis present

## 2013-01-03 DIAGNOSIS — E785 Hyperlipidemia, unspecified: Secondary | ICD-10-CM | POA: Insufficient documentation

## 2013-01-03 DIAGNOSIS — N1330 Unspecified hydronephrosis: Secondary | ICD-10-CM | POA: Diagnosis present

## 2013-01-03 DIAGNOSIS — I129 Hypertensive chronic kidney disease with stage 1 through stage 4 chronic kidney disease, or unspecified chronic kidney disease: Secondary | ICD-10-CM | POA: Insufficient documentation

## 2013-01-03 DIAGNOSIS — N289 Disorder of kidney and ureter, unspecified: Principal | ICD-10-CM | POA: Insufficient documentation

## 2013-01-03 LAB — CBC
MCH: 30 pg (ref 26.0–34.0)
MCV: 88.1 fL (ref 78.0–100.0)
Platelets: 161 10*3/uL (ref 150–400)
RDW: 12 % (ref 11.5–15.5)

## 2013-01-03 MED ORDER — PANTOPRAZOLE SODIUM 40 MG PO TBEC: 40.0000 mg | DELAYED_RELEASE_TABLET | Freq: Every day | ORAL | Status: DC

## 2013-01-03 MED ORDER — MECLIZINE HCL 25 MG PO TABS: 25.0000 mg | ORAL_TABLET | Freq: Every day | ORAL | Status: DC

## 2013-01-03 MED ORDER — MOMETASONE FURO-FORMOTEROL FUM 100-5 MCG/ACT IN AERO
2.0000 | INHALATION_SPRAY | Freq: Two times a day (BID) | RESPIRATORY_TRACT | Status: DC
  Administered 2013-01-03 – 2013-01-04 (×2): 2 via RESPIRATORY_TRACT
  Filled 2013-01-03: qty 8.8

## 2013-01-03 MED ORDER — ATENOLOL 100 MG PO TABS
100.0000 mg | ORAL_TABLET | Freq: Every day | ORAL | Status: DC
  Administered 2013-01-04: 100 mg via ORAL
  Filled 2013-01-03: qty 1

## 2013-01-03 MED ORDER — SENNOSIDES-DOCUSATE SODIUM 8.6-50 MG PO TABS
1.0000 | ORAL_TABLET | Freq: Two times a day (BID) | ORAL | Status: DC
  Administered 2013-01-04: 1 via ORAL
  Filled 2013-01-03 (×3): qty 1

## 2013-01-03 MED ORDER — MAGNESIUM CHLORIDE 64 MG PO TBEC
1.0000 | DELAYED_RELEASE_TABLET | Freq: Two times a day (BID) | ORAL | Status: DC
  Administered 2013-01-03 – 2013-01-04 (×2): 64 mg via ORAL
  Filled 2013-01-03 (×3): qty 1

## 2013-01-03 MED ORDER — SODIUM CHLORIDE 0.9 % IV SOLN
INTRAVENOUS | Status: DC
  Administered 2013-01-03: 20:00:00 via INTRAVENOUS

## 2013-01-03 MED ORDER — HEPARIN SODIUM (PORCINE) 5000 UNIT/ML IJ SOLN
5000.0000 [IU] | Freq: Three times a day (TID) | INTRAMUSCULAR | Status: DC
  Administered 2013-01-03 – 2013-01-04 (×2): 5000 [IU] via SUBCUTANEOUS
  Filled 2013-01-03 (×5): qty 1

## 2013-01-03 MED ORDER — OXYCODONE-ACETAMINOPHEN 5-325 MG PO TABS: 1.0000 | ORAL_TABLET | ORAL | Status: DC | PRN

## 2013-01-03 MED ORDER — CYCLOBENZAPRINE HCL 10 MG PO TABS
10.0000 mg | ORAL_TABLET | Freq: Two times a day (BID) | ORAL | Status: DC | PRN
  Filled 2013-01-03: qty 1

## 2013-01-03 MED ORDER — HYOSCYAMINE SULFATE 0.125 MG SL SUBL
0.1250 mg | SUBLINGUAL_TABLET | SUBLINGUAL | Status: DC | PRN
  Filled 2013-01-03: qty 1

## 2013-01-03 MED ORDER — SIMVASTATIN 20 MG PO TABS
20.0000 mg | ORAL_TABLET | Freq: Every day | ORAL | Status: DC
  Filled 2013-01-03: qty 1

## 2013-01-03 MED ORDER — CITALOPRAM HYDROBROMIDE 40 MG PO TABS: 40.0000 mg | ORAL_TABLET | Freq: Every day | ORAL | Status: DC

## 2013-01-03 MED ORDER — HEPARIN SODIUM (PORCINE) 5000 UNIT/ML IJ SOLN
5000.0000 [IU] | Freq: Three times a day (TID) | INTRAMUSCULAR | Status: DC
  Administered 2013-01-03: 5000 [IU] via SUBCUTANEOUS
  Filled 2013-01-03 (×2): qty 1

## 2013-01-03 MED ORDER — SIMVASTATIN 10 MG PO TABS
10.0000 mg | ORAL_TABLET | Freq: Every day | ORAL | Status: DC
  Filled 2013-01-03: qty 1

## 2013-01-03 MED ORDER — VANCOMYCIN HCL 10 G IV SOLR
1250.0000 mg | INTRAVENOUS | Status: DC
  Administered 2013-01-03: 1250 mg via INTRAVENOUS
  Filled 2013-01-03 (×2): qty 1250

## 2013-01-03 NOTE — Progress Notes (Signed)
Patient activated My Chart. Briscoe Burns BSN, RN-BC Admissions RN  01/03/2013 5:26 PM

## 2013-01-03 NOTE — H&P (Signed)
Urology History and Physical Exam  CC: Bilateral hydronephrosis. Acute renal insufficiency. Bone lesions. UTI.  HPI:  This 62 year old male is admitted today for some acute issues.  #1 acute renal insufficiency  He has chronic renal insufficiency as evidenced by a passed renal function with a creatinine around 1.2-1.3.  Most recent renal function from his PCP on 10/25/12 with a creatinine of 1.8 with a GFR of 39.  Renal function was drawn 01/01/13 which revealed a creatinine of 2.16 with a GFR of 31.  Nothing makes it better or worse.  She denies flank pain or gross hematuria.  Repeat labs today in clinic revealed a creatinine of 1.98 with a GFR of 35 potassium 4.1.  #2 urinary tract infection  This patient was found to have UTI from his previous visit on 12/27/12.  This was coagulase-negative Staphylococcus.  It was resistant to many antibiotics, but it was sensitive to gentamicin, vancomycin, nitrofurantoin, and rifampin.  His renal function is not good enough for nitrofurantoin.  He was started on gentamicin intramuscular injection 120 mg.  He is due for her third dose today.  We discussed obtaining an gentamicin trough today which we obtained.  Those results are pending.  He denies any gross hematuria at this time.  #3 hydronephrosis  Duration is unknown.  This is a new finding.  This is bilateral in nature.  It is moderate to severe on both sides.  It is associated with dilation of his ureters all the way to the bladder and a tremendously dilated bladder.  This was discovered on CT scan of the abdomen and pelvis without contrast today.  Today I reviewed the entire CT report with the patient and provided him with a copy of the report.  Today we discussed that this could be due to bladder outlet obstruction, blockage of his ureters bilaterally which could be due to stricture or prostate cancer.  I've advised that we obtain a PSA today which we will last time I believe he may have prostatitis.  #4  bone lesions  This is a new finding on CT scan today.  These are located in the pelvic bone.  No other bones are involved and the scope CT scan.  They could represent bone island or metastatic disease.  Nothing makes it better or worse.  It is not associated with bone pain.  I explained that this could be evidence of an advanced cancer versus a benign finding in association with the hydronephrosis.  The largest appears to be approximately 1 cm in size located in the right iliac bone.  PMH: Past Medical History  Diagnosis Date  . Hyperlipidemia   . HTN (hypertension)   . Allergic rhinitis   . Dyspnea     PSH: Past Surgical History  Procedure Laterality Date  . Hernia repair    . Nissen fundoplication    . Laparoscopic lysis intestinal adhesions      Allergies: Not on File  Medications: No prescriptions prior to admission     Social History: History   Social History  . Marital Status: Widowed    Spouse Name: N/A    Number of Children: N/A  . Years of Education: N/A   Occupational History  . Not on file.   Social History Main Topics  . Smoking status: Passive Smoke Exposure - Never Smoker -- 34 years  . Smokeless tobacco: Not on file  . Alcohol Use: No  . Drug Use: No  . Sexual Activity: Not on file  Other Topics Concern  . Not on file   Social History Narrative  . No narrative on file    Family History: Family History  Problem Relation Age of Onset  . Colon cancer Mother   . Prostate cancer Father     Review of Systems: Positive: None. Negative: Chest pain, SOB, or nausea.  A further 10 point review of systems was negative except what is listed in the HPI.  Physical Exam:  General: No acute distress.  Awake. Head:  Normocephalic.  Atraumatic. ENT:  EOMI.  Mucous membranes moist Neck:  Supple.  No lymphadenopathy. CV:  S1 present. S2 present. Regular rate. Pulmonary: Equal effort bilaterally.  Clear to auscultation bilaterally. Abdomen: Soft.   Non- tender to palpation. Skin:  Normal turgor.  No visible rash. Extremity: No gross deformity of bilateral upper extremities.  No gross deformity of    bilateral lower extremities. Neurologic: Alert. Appropriate mood.  Penis:  Circumcised.  No lesions. Urethra: Foley catheter in place.  Orthotopic meatus.   Studies:  No results found for this basename: HGB, WBC, PLT,  in the last 72 hours  No results found for this basename: NA, K, CL, CO2, BUN, CREATININE, CALCIUM, MAGNESIUM, GFRNONAA, GFRAA,  in the last 72 hours   No results found for this basename: PT, INR, APTT,  in the last 72 hours   No components found with this basename: ABG,     Assessment:  Bilateral hydronephrosis. Acute renal insufficiency. Bone lesions. UTI.  Plan: - Urine culture from clinic pending. -Treat current UTI w/ IV vancomycin per pharmacy consult.  -Recheck BMP in the morning. -Renal US tomorrow to check for resolution of hydronephrosis.

## 2013-01-03 NOTE — Progress Notes (Addendum)
ANTIBIOTIC CONSULT NOTE - INITIAL  Pharmacy Consult for Vancomycin and pharmacy may adjust antibiotics. Indication: UTI  Not on File  Patient Measurements: Height: 6\' 1"  (185.4 cm) Weight: 181 lb 1.6 oz (82.146 kg) IBW/kg (Calculated) : 79.9   Vital Signs:   Intake/Output from previous day:   Intake/Output from this shift: Total I/O In: -  Out: 200 [Urine:200]  Labs: No results found for this basename: WBC, HGB, PLT, LABCREA, CREATININE,  in the last 72 hours Estimated Creatinine Clearance: 65.4 ml/min (by C-G formula based on Cr of 1.29). No results found for this basename: VANCOTROUGH, VANCOPEAK, VANCORANDOM, GENTTROUGH, GENTPEAK, GENTRANDOM, TOBRATROUGH, TOBRAPEAK, TOBRARND, AMIKACINPEAK, AMIKACINTROU, AMIKACIN,  in the last 72 hours   Microbiology: No results found for this or any previous visit (from the past 720 hour(s)).  Medical History: Past Medical History  Diagnosis Date  . Hyperlipidemia   . HTN (hypertension)   . Allergic rhinitis   . Dyspnea     Medications:  Prescriptions prior to admission  Medication Sig Dispense Refill  . aspirin 81 MG tablet Take 81 mg by mouth daily.        Marland Kitchen atenolol (TENORMIN) 100 MG tablet Take 100 mg by mouth daily.        . B Complex-C (SUPER B COMPLEX PO) Take 1 tablet by mouth daily.        . citalopram (CELEXA) 40 MG tablet Take 40 mg by mouth daily.        . cyclobenzaprine (FLEXERIL) 10 MG tablet Take 10 mg by mouth 2 (two) times daily as needed.        . diphenhydrAMINE (COMPLETE ALLERGY RELIEF) 25 MG tablet Take 25 mg by mouth daily.        . Ferrous Sulfate Dried (FEOSOL) 200 (65 FE) MG TABS Take 1 tablet by mouth 2 (two) times daily.        . fish oil-omega-3 fatty acids 1000 MG capsule Take 2 g by mouth daily.        . Fluticasone-Salmeterol (ADVAIR DISKUS) 250-50 MCG/DOSE AEPB Inhale 1 puff into the lungs every 12 (twelve) hours.       . Magnesium 250 MG TABS Take 1 tablet by mouth daily.        . meclizine  (ANTIVERT) 25 MG tablet Take 25 mg by mouth daily.        . meloxicam (MOBIC) 15 MG tablet Take 15 mg by mouth daily.        Marland Kitchen omeprazole (PRILOSEC) 20 MG capsule Take 20 mg by mouth daily.        . pravastatin (PRAVACHOL) 40 MG tablet Take 40 mg by mouth daily.        . traMADol (ULTRAM) 50 MG tablet Take 50 mg by mouth every 6 (six) hours as needed.         Anti-infectives   None     Assessment: 62yo M bilateral hydronephrosis, ARF, UTI, bone lesions. 8/14 dx with CNS UTI sens only to gent, vanc, nitro, rifamp. On gent IM at home since 8/19. Pharmacy is asked to dose Vancomycin.   SCr 1.99, CrCl 42CG, 38N.  Goal of Therapy:  Vancomycin trough level 15-20 mcg/ml  Plan:   Vancomycin 1250mg  IV q24h.  Measure Vanc trough at steady state.  Follow up renal fxn and culture results.  Charolotte Eke, PharmD, pager [phone]. 01/03/2013,4:43 PM.

## 2013-01-04 ENCOUNTER — Observation Stay (HOSPITAL_COMMUNITY): Payer: BC Managed Care – PPO

## 2013-01-04 DIAGNOSIS — N189 Chronic kidney disease, unspecified: Secondary | ICD-10-CM | POA: Diagnosis present

## 2013-01-04 DIAGNOSIS — N138 Other obstructive and reflux uropathy: Secondary | ICD-10-CM | POA: Diagnosis present

## 2013-01-04 DIAGNOSIS — N1330 Unspecified hydronephrosis: Secondary | ICD-10-CM | POA: Diagnosis present

## 2013-01-04 LAB — BASIC METABOLIC PANEL
CO2: 29 mEq/L (ref 19–32)
Calcium: 9.6 mg/dL (ref 8.4–10.5)
Creatinine, Ser: 1.74 mg/dL — ABNORMAL HIGH (ref 0.50–1.35)
Glucose, Bld: 111 mg/dL — ABNORMAL HIGH (ref 70–99)
Sodium: 138 mEq/L (ref 135–145)

## 2013-01-04 MED ORDER — PIROXICAM 10 MG PO CAPS
10.0000 mg | ORAL_CAPSULE | Freq: Every day | ORAL | Status: DC
Start: 2013-01-04 — End: 2013-01-04
  Filled 2013-01-04: qty 1

## 2013-01-04 MED ORDER — FAMOTIDINE 20 MG PO TABS
20.0000 mg | ORAL_TABLET | Freq: Two times a day (BID) | ORAL | Status: DC
  Administered 2013-01-04: 20 mg via ORAL
  Filled 2013-01-04 (×2): qty 1

## 2013-01-04 MED ORDER — VITAMIN D3 25 MCG (1000 UNIT) PO TABS
1000.0000 [IU] | ORAL_TABLET | Freq: Every day | ORAL | Status: DC
  Administered 2013-01-04: 1000 [IU] via ORAL
  Filled 2013-01-04: qty 1

## 2013-01-04 MED ORDER — TAMSULOSIN HCL 0.4 MG PO CAPS: 0.4000 mg | ORAL_CAPSULE | Freq: Every day | ORAL | Status: DC

## 2013-01-04 MED ORDER — TAMSULOSIN HCL 0.4 MG PO CAPS
0.4000 mg | ORAL_CAPSULE | Freq: Every day | ORAL | Status: DC
  Filled 2013-01-04: qty 1

## 2013-01-04 MED ORDER — SENNOSIDES-DOCUSATE SODIUM 8.6-50 MG PO TABS: 1.0000 | ORAL_TABLET | Freq: Two times a day (BID) | ORAL | Status: DC

## 2013-01-04 MED ORDER — ADULT MULTIVITAMIN W/MINERALS CH
1.0000 | ORAL_TABLET | Freq: Every day | ORAL | Status: DC
  Administered 2013-01-04: 1 via ORAL
  Filled 2013-01-04: qty 1

## 2013-01-04 NOTE — Discharge Summary (Signed)
Physician Discharge Summary  Patient ID: [name] MRN: [id number] DOB/AGE: 1951-04-24 62 y.o.  Admit date: 01/03/2013 Discharge date: 01/04/2013  Admission Diagnoses: Acute on chronic renal insufficiency Hydronephrosis- bilateral UTI BPH w/ obstruction  Discharge Diagnoses:  Principal Problem:   Acute on chronic renal insufficiency Active Problems:   Hydronephrosis, bilateral   Infection of urinary tract   BPH with urinary obstruction   Discharged Condition: good  Hospital Course:  This patient was admitted from clinic under direct admission due to acute on chronic renal insufficiency with bilateral hydronephrosis.  This appeared to be due to bladder outlet obstruction.  He also was noted to have a UTI based on previous urine culture.  Electrolytes were obtained which revealed better stability compared to those obtained in  the office.  His PSA returned stable at 1.8.  He did not have an excessive amount of urine output.  His renal function improved.  It appears that the main culprit for all of this would be bladder outlet obstruction.  He was started on tamsulosin.  Follow-up renal ultrasound revealed marked improvement in his bilateral hydronephrosis (I discussed the results with the patient).  It was felt that he could be discharged home.  He was given discharge instructions.  He has been treated with gentamicin and vancomycin for his urinary tract infection.  An outpatient urine culture is pending and will guide further treatment.  No further antibiotics at this time.  He will follow up in 1-2 weeks for voiding trial.  Consults: None  Significant Diagnostic Studies: labs: Cr 1.7 and radiology: Ultrasound: Renal:  Marked improvement in bilateral hydronephrosis.  Treatments: IV hydration and antibiotics: vancomycin  Discharge Exam: Blood pressure 141/86, pulse 51, temperature 97.5 F (36.4 C), temperature source Oral, resp. rate 18, height 6\' 1"  (1.854 m), weight 82.146 kg  (181 lb 1.6 oz), SpO2 100.00%. Refer to PE from progress note on date of discharge.  Disposition: 01-Home or Self Care     Medication List    STOP taking these medications       nitrofurantoin (macrocrystal-monohydrate) 100 MG capsule  Commonly known as:  MACROBID      TAKE these medications       atenolol 100 MG tablet  Commonly known as:  TENORMIN  Take 100 mg by mouth daily.     cholecalciferol 1000 UNITS tablet  Commonly known as:  VITAMIN D  Take 1,000 Units by mouth daily.     cyclobenzaprine 10 MG tablet  Commonly known as:  FLEXERIL  Take 10 mg by mouth 3 (three) times daily as needed for muscle spasms.     fish oil-omega-3 fatty acids 1000 MG capsule  Take 2 g by mouth daily.     multivitamin with minerals Tabs tablet  Take 1 tablet by mouth daily.     piroxicam 10 MG capsule  Commonly known as:  FELDENE  Take 10 mg by mouth daily.     pravastatin 40 MG tablet  Commonly known as:  PRAVACHOL  Take 40 mg by mouth daily.     ranitidine 150 MG tablet  Commonly known as:  ZANTAC  Take 150 mg by mouth 2 (two) times daily.     senna-docusate 8.6-50 MG per tablet  Commonly known as:  SENOKOT S  Take 1 tablet by mouth 2 (two) times daily.     tamsulosin 0.4 MG Caps capsule  Commonly known as:  FLOMAX  Take 1 capsule (0.4 mg total) by mouth at bedtime.  Follow-up Information   Follow up with Milford Cage, MD. Call in 2 weeks.   Specialty:  Urology   Contact information:   [address] [place] FLOOR [address], [place] [address] [phone]       Signed: Milford Cage 01/04/2013, 7:50 AM

## 2013-01-04 NOTE — Progress Notes (Signed)
Urology Progress Note  Subjective:     No acute urologic events overnight. Patient did not sleep well. Positive flatus.  We discussed his lab results today and his PSA from yesterday which returned low at 1.8, which means his issue is unlikely to be due to prostate cancer.  ROS: Negative: chest pain or SOB.  Objective:  Patient Vitals for the past 24 hrs:  BP Temp Temp src Pulse Resp SpO2 Height Weight  01/04/13 0647 141/86 mmHg 97.5 F (36.4 C) Oral 51 18 100 % - -  01/03/13 2103 134/88 mmHg 98.2 F (36.8 C) Oral 56 18 100 % - -  01/03/13 2039 - - - 64 18 100 % - -  01/03/13 1608 - - - - - - 6\' 1"  (1.854 m) 82.146 kg (181 lb 1.6 oz)  01/03/13 1600 141/91 mmHg 98.3 F (36.8 C) Oral 65 18 100 % - -    Physical Exam: General:  No acute distress, awake Cardiovascular:    [x]   S1/S2 present, RRR  []   Irregularly irregular Chest:  CTA-B Abdomen:               []  Soft, appropriately TTP  [x]  Soft, NTTP  []  Soft, appropriately TTP, incision(s) clean/dry/intact  Genitourinary: Foley in place. Negative GU edema. Foley:  Draining clear yellow urine. One small clot in the tubing discolored the urine in the bag. Old dried blood at urethral meatus.    I/O last 3 completed shifts: In: -  Out: 200 [Urine:200]  Recent Labs     01/03/13  1629  HGB  13.1  WBC  9.4  PLT  161    Recent Labs     01/03/13  1629  01/04/13  0410  NA   --   138  K   --   4.1  CL   --   103  CO2   --   29  BUN   --   23  CREATININE  1.99*  1.74*  CALCIUM   --   9.6  GFRNONAA  34*  40*  GFRAA  39*  46*     No results found for this basename: PT, INR, APTT,  in the last 72 hours   No components found with this basename: ABG,     Length of stay: 1 days.  Assessment: Bilateral hydronephrosis. Acute on chronic renal insufficiency. UTI.   Plan: -Renal function improving with placement of foley catheter.  -Will check for improvement in hydronephrosis today with renal US.  -Continue  treatment for UTI.  -Start tamsulosin for BPH with urinary retention. We discussed the risks/benefits/side effects. He denies cataracts.  -Likely discharge home today. We reviewed discharge plans/instructions.  -No further antibiotics at this time after receiving Gentamicin as an outpatient and vancomycin as an inpatient. Will f/u on outpatient urine culture from yesterday to determine if any further treatment is indicated.   Natalia Leatherwood, MD [phone]

## 2013-01-04 NOTE — Progress Notes (Signed)
Discharge instructions explained using teach back, leg bag usage demonstrated with return demo done, prescriptions given. Pt verbalizes understanding of same. Stable on discharge.

## 2013-01-18 ENCOUNTER — Telehealth: Payer: Self-pay | Admitting: Oncology

## 2013-01-18 NOTE — Telephone Encounter (Signed)
ANNE FROM DR [facility] WILL CALL PT IN REF TO NP APPT ON 02/05/13@1 :30 REFERRING DR WOODRUFF DX-BONE LESIONS IN PELVIC MAILED NP PACKET

## 2013-01-18 NOTE — Telephone Encounter (Signed)
C/D 01/18/13 for appt. 02/05/13

## 2013-02-01 ENCOUNTER — Other Ambulatory Visit: Payer: Self-pay | Admitting: Oncology

## 2013-02-01 ENCOUNTER — Telehealth: Payer: Self-pay | Admitting: *Deleted

## 2013-02-01 DIAGNOSIS — D729 Disorder of white blood cells, unspecified: Secondary | ICD-10-CM

## 2013-02-01 NOTE — Telephone Encounter (Signed)
sw pt friend informed her that the pt appts for 02/05/13 are rs to 02/07/13 w/ FC@ 11am, labs@ 11:15am and ov@ 11:30am....td

## 2013-02-05 ENCOUNTER — Ambulatory Visit: Payer: BC Managed Care – PPO

## 2013-02-05 ENCOUNTER — Ambulatory Visit: Payer: BC Managed Care – PPO | Admitting: Oncology

## 2013-02-05 ENCOUNTER — Other Ambulatory Visit: Payer: BC Managed Care – PPO | Admitting: Lab

## 2013-02-07 ENCOUNTER — Other Ambulatory Visit (HOSPITAL_BASED_OUTPATIENT_CLINIC_OR_DEPARTMENT_OTHER): Payer: BC Managed Care – PPO | Admitting: Lab

## 2013-02-07 ENCOUNTER — Encounter: Payer: Self-pay | Admitting: Oncology

## 2013-02-07 ENCOUNTER — Ambulatory Visit (HOSPITAL_BASED_OUTPATIENT_CLINIC_OR_DEPARTMENT_OTHER): Payer: BC Managed Care – PPO | Admitting: Oncology

## 2013-02-07 ENCOUNTER — Ambulatory Visit: Payer: BC Managed Care – PPO

## 2013-02-07 VITALS — BP 144/89 | HR 48 | Temp 97.4°F | Resp 17 | Ht 73.0 in | Wt 187.1 lb

## 2013-02-07 DIAGNOSIS — D7289 Other specified disorders of white blood cells: Secondary | ICD-10-CM

## 2013-02-07 DIAGNOSIS — M899 Disorder of bone, unspecified: Secondary | ICD-10-CM

## 2013-02-07 DIAGNOSIS — D729 Disorder of white blood cells, unspecified: Secondary | ICD-10-CM

## 2013-02-07 LAB — COMPREHENSIVE METABOLIC PANEL (CC13)
Alkaline Phosphatase: 47 U/L (ref 40–150)
BUN: 22.1 mg/dL (ref 7.0–26.0)
Creatinine: 1.6 mg/dL — ABNORMAL HIGH (ref 0.7–1.3)
Glucose: 97 mg/dl (ref 70–140)
Sodium: 143 mEq/L (ref 136–145)
Total Bilirubin: 0.65 mg/dL (ref 0.20–1.20)

## 2013-02-07 LAB — CBC WITH DIFFERENTIAL/PLATELET
Eosinophils Absolute: 0.2 10*3/uL (ref 0.0–0.5)
HCT: 41.5 % (ref 38.4–49.9)
LYMPH%: 29.5 % (ref 14.0–49.0)
MCV: 90.1 fL (ref 79.3–98.0)
MONO%: 8.4 % (ref 0.0–14.0)
NEUT#: 4 10*3/uL (ref 1.5–6.5)
NEUT%: 59 % (ref 39.0–75.0)
Platelets: 129 10*3/uL — ABNORMAL LOW (ref 140–400)
RBC: 4.6 10*6/uL (ref 4.20–5.82)

## 2013-02-07 NOTE — Progress Notes (Signed)
Please see consult note.  

## 2013-02-07 NOTE — Consult Note (Signed)
Reason for Referral: Bone lesions.   HPI: 62 year old gentleman native of the state of [place] but have been living in [place] since the 60s. He is currently retired and have had multiple occupations in the past. He does have history of hypertension and hyperlipidemia and have been followed by [facility] under the care of Dr. Margarita Grizzle due to BPH and recurrent UTIs. He was hospitalized on 01/03/2013 after he presented with worsening renal failure on bilateral hydronephrosis. He was noted to have a creatinine of 2.1 after his baseline have been around 1.2. He was discharged on 01/04/2013 after the insertion of a catheter that have resolved his issues. His workup included a PSA of 1.8 but a CT scan of the abdomen and pelvis on 01/03/2013 showed significant bilateral hydronephrosis and hydroureter and several sclerotic bone lesions in the pelvis that was described as possible bone islands but could not exclude metastatic lesions. After the insertion of his Foley catheter, his creatinine corrected to 1.74 and a repeat ultrasound on 01/04/2013 showed a markedly improved hydronephrosis. Patient referred to me for the evaluation of his bone lesions.  Clinically, he is relatively asymptomatic. He still have a catheter in place and has good urine output. He does not have any constitutional symptoms of weight loss, or appetite changes or decline in his activity of daily living. He have lost 60 pounds over a long period of time intentionally but otherwise appetite is normal. He is not reporting any back pain, hip pain, shoulder pain or any lower extremity pain. Not reporting any chest pain or difficulty breathing is that report any cough or hemoptysis is that report any bleeding diathesis   Past Medical History  Diagnosis Date  . Hyperlipidemia   . HTN (hypertension)   . Allergic rhinitis   . Dyspnea   :  Past Surgical History  Procedure Laterality Date  . Hernia repair    . Nissen fundoplication     . Laparoscopic lysis intestinal adhesions    :  Current Outpatient Prescriptions  Medication Sig Dispense Refill  . atenolol (TENORMIN) 100 MG tablet Take 100 mg by mouth daily.        . cholecalciferol (VITAMIN D) 1000 UNITS tablet Take 1,000 Units by mouth daily.      . cyclobenzaprine (FLEXERIL) 10 MG tablet Take 10 mg by mouth 3 (three) times daily as needed for muscle spasms.      . fish oil-omega-3 fatty acids 1000 MG capsule Take 2 g by mouth daily.        . Multiple Vitamin (MULTIVITAMIN WITH MINERALS) TABS tablet Take 1 tablet by mouth daily.      . piroxicam (FELDENE) 10 MG capsule Take 10 mg by mouth daily.      Marland Kitchen senna-docusate (SENOKOT S) 8.6-50 MG per tablet Take 1 tablet by mouth 2 (two) times daily.  60 tablet  0  . tamsulosin (FLOMAX) 0.4 MG CAPS capsule Take 1 capsule (0.4 mg total) by mouth at bedtime.  30 capsule  11  . ranitidine (ZANTAC) 150 MG tablet Take 150 mg by mouth 2 (two) times daily.       No current facility-administered medications for this visit.      No Known Allergies:  Family History  Problem Relation Age of Onset  . Colon cancer Mother   . Prostate cancer Father   :  History   Social History  . Marital Status: Widowed    Spouse Name: N/A    Number of Children: N/A  .  Years of Education: N/A   Occupational History  . Not on file.   Social History Main Topics  . Smoking status: Passive Smoke Exposure - Never Smoker -- 34 years  . Smokeless tobacco: Former Neurosurgeon    Types: Chew    Quit date: 01/03/1978  . Alcohol Use: No  . Drug Use: No  . Sexual Activity: Not on file   Other Topics Concern  . Not on file   Social History Narrative  . No narrative on file  :  Pertinent items are noted in HPI.  Exam: Blood pressure 144/89, pulse 48, temperature 97.4 F (36.3 C), resp. rate 17, height 6\' 1"  (1.854 m), weight 187 lb 1.6 oz (84.868 kg), SpO2 99.00%. General appearance: alert, cooperative and appears stated age Head:  Normocephalic, without obvious abnormality, atraumatic Throat: lips, mucosa, and tongue normal; teeth and gums normal Neck: no adenopathy, no carotid bruit, no JVD, supple, symmetrical, trachea midline and thyroid not enlarged, symmetric, no tenderness/mass/nodules Back: range of motion normal, symmetric, no curvature. ROM normal. No CVA tenderness. Resp: clear to auscultation bilaterally Chest wall: no tenderness Cardio: regular rate and rhythm, S1, S2 normal, no murmur, click, rub or gallop GI: soft, non-tender; bowel sounds normal; no masses,  no organomegaly Extremities: extremities normal, atraumatic, no cyanosis or edema Pulses: 2+ and symmetric Skin: Skin color, texture, turgor normal. No rashes or lesions Lymph nodes: Cervical, supraclavicular, and axillary nodes normal.   Recent Labs  02/07/13 1059  WBC 6.8  HGB 14.0  HCT 41.5  PLT 129*   No results found for this basename: NA, K, CL, CO2, GLUCOSE, BUN, CREATININE, CALCIUM,  in the last 72 hours    Assessment and Plan:   62 year old gentleman with the following issues:  1. Incidental finding of sclerotic bony lesions in the pelvis during the workup of BPH and hydronephrosis. The differential diagnosis was discussed today which include benign bone lesions such as bone islands versus metastatic malignancy into the bone. I have personally reviewed his CT scan images with radiology and his scan on 01/03/2013 was compared to a CT scan done on 04/28/2008 which showed that most of those lesions that were imaged are unchanged for the last 5 years. This goes against the diagnosis of metastatic solid tumor malignancy to the bone. In the setting of a PSA of 1.8 and no clear cut evidence of any bladder tumors it will be extremely unlikely that these lesions are malignant. Based on radiology's recommendations, any further testing such as bone scan or an MRI will not really yield any greater benefit. I've also discussed with him the  potential biopsy which would be also low yield and most likely can miss these small lesions., Management standpoint, I see really no further intervention is needed and I recommend a continuous age-appropriate prostate cancer and bladder cancer screening by Dr. Margarita Grizzle.  2. Possible bladder outlet obstruction presented with acute renal failure and bilateral hydronephrosis: I doubt this is due to the malignant nature. It is possible this is due to BPH or other urological issues that could be contributing to that. He is following up with Dr. Margarita Grizzle and continues to have that Foley catheter at this time.  All his questions were answered today and I will be happy to see him in the future as needed.

## 2013-02-07 NOTE — Progress Notes (Signed)
Checked in new pt with no financial concerns. °

## 2013-02-11 LAB — SPEP & IFE WITH QIG
Albumin ELP: 62.5 % (ref 55.8–66.1)
IgA: 210 mg/dL (ref 68–379)
IgG (Immunoglobin G), Serum: 1150 mg/dL (ref 650–1600)
IgM, Serum: 27 mg/dL — ABNORMAL LOW (ref 41–251)
Total Protein, Serum Electrophoresis: 6.7 g/dL (ref 6.0–8.3)

## 2013-03-15 ENCOUNTER — Other Ambulatory Visit (HOSPITAL_COMMUNITY): Payer: Self-pay | Admitting: *Deleted

## 2013-03-15 ENCOUNTER — Other Ambulatory Visit: Payer: Self-pay | Admitting: Urology

## 2013-03-15 ENCOUNTER — Encounter (HOSPITAL_COMMUNITY): Payer: Self-pay | Admitting: Pharmacy Technician

## 2013-03-15 NOTE — Progress Notes (Signed)
Dr. Margarita Grizzle could you please sign orders in [facility] as patient has PST appointment on Mon. 03/18/2013 at 9 am! Thank You!

## 2013-03-15 NOTE — Patient Instructions (Addendum)
[name]  03/15/2013                           YOUR PROCEDURE IS SCHEDULED ON:  03/20/13               PLEASE REPORT TO SHORT STAY CENTER AT : 6:00 AM               CALL THIS NUMBER IF ANY PROBLEMS THE DAY OF SURGERY :               [phone]                      REMEMBER:   Do not eat food or drink liquids AFTER MIDNIGHT   Take these medicines the morning of surgery with A SIP OF WATER:  FLOMAX   Do not wear jewelry, make-up   Do not wear lotions, powders, or perfumes.   Do not shave legs or underarms 12 hrs. before surgery (men may shave face)  Do not bring valuables to the hospital.  Contacts, dentures or bridgework may not be worn into surgery.  Leave suitcase in the car. After surgery it may be brought to your room.  For patients admitted to the hospital more than one night, checkout time is 11:00                          The day of discharge.   Patients discharged the day of surgery will not be allowed to drive home                             If going home same day of surgery, must have someone stay with you first                           24 hrs at home and arrange for some one to drive you home from hospital.    Special Instructions:   Please read over the following fact sheets that you were given:                1. [name] PREPARING FOR SURGERY SHEET                 2.STOP HERBAL MEDICATION AND ANY ASPIRIN  5 DAYS PREOP                                                X_____________________________________________________________________        Failure to follow these instructions may result in cancellation of your surgery

## 2013-03-18 ENCOUNTER — Encounter (HOSPITAL_COMMUNITY)
Admission: RE | Admit: 2013-03-18 | Discharge: 2013-03-18 | Disposition: A | Discharge status: Home / Self Care | Payer: BC Managed Care – PPO | Source: Ambulatory Visit | Attending: Urology | Admitting: Urology

## 2013-03-18 ENCOUNTER — Encounter (HOSPITAL_COMMUNITY): Payer: Self-pay

## 2013-03-18 ENCOUNTER — Ambulatory Visit (HOSPITAL_COMMUNITY)
Admission: RE | Admit: 2013-03-18 | Discharge: 2013-03-18 | Disposition: A | Discharge status: Home / Self Care | Payer: BC Managed Care – PPO | Source: Ambulatory Visit | Attending: Urology | Admitting: Urology

## 2013-03-18 DIAGNOSIS — I10 Essential (primary) hypertension: Secondary | ICD-10-CM | POA: Insufficient documentation

## 2013-03-18 DIAGNOSIS — Z01812 Encounter for preprocedural laboratory examination: Secondary | ICD-10-CM | POA: Insufficient documentation

## 2013-03-18 DIAGNOSIS — E785 Hyperlipidemia, unspecified: Secondary | ICD-10-CM | POA: Insufficient documentation

## 2013-03-18 DIAGNOSIS — Z01818 Encounter for other preprocedural examination: Secondary | ICD-10-CM | POA: Insufficient documentation

## 2013-03-18 HISTORY — DX: Nocturia: R35.1

## 2013-03-18 HISTORY — DX: Retention of urine, unspecified: R33.9

## 2013-03-18 HISTORY — DX: Headache: R51

## 2013-03-18 HISTORY — DX: Urinary tract infection, site not specified: N39.0

## 2013-03-18 HISTORY — DX: Presence of other specified devices: Z97.8

## 2013-03-18 HISTORY — DX: Presence of urogenital implants: Z96.0

## 2013-03-18 HISTORY — DX: Depression, unspecified: F32.A

## 2013-03-18 HISTORY — DX: Major depressive disorder, single episode, unspecified: F32.9

## 2013-03-18 HISTORY — DX: Unspecified osteoarthritis, unspecified site: M19.90

## 2013-03-18 LAB — CBC
HCT: 44.4 % (ref 39.0–52.0)
Hemoglobin: 14.8 g/dL (ref 13.0–17.0)
MCHC: 33.3 g/dL (ref 30.0–36.0)
MCV: 90.6 fL (ref 78.0–100.0)
Platelets: 150 10*3/uL (ref 150–400)
RDW: 12.6 % (ref 11.5–15.5)

## 2013-03-18 LAB — BASIC METABOLIC PANEL
BUN: 21 mg/dL (ref 6–23)
CO2: 30 mEq/L (ref 19–32)
Calcium: 10.4 mg/dL (ref 8.4–10.5)
Creatinine, Ser: 1.68 mg/dL — ABNORMAL HIGH (ref 0.50–1.35)
GFR calc Af Amer: 48 mL/min — ABNORMAL LOW (ref >90)
GFR calc non Af Amer: 41 mL/min — ABNORMAL LOW (ref >90)
Potassium: 4.1 mEq/L (ref 3.5–5.1)

## 2013-03-18 NOTE — Progress Notes (Signed)
03/18/13 0902  OBSTRUCTIVE SLEEP APNEA  Have you ever been diagnosed with sleep apnea through a sleep study? No  Do you snore loudly (loud enough to be heard through closed doors)?  1  Do you often feel tired, fatigued, or sleepy during the daytime? 1  Has anyone observed you stop breathing during your sleep? 0  Do you have, or are you being treated for high blood pressure? 1  BMI more than 35 kg/m2? 0  Age over 64 years old? 1  Neck circumference greater than 40 cm/18 inches? 0  Gender: 1  Obstructive Sleep Apnea Score 5  Score 4 or greater  Results sent to PCP

## 2013-03-19 NOTE — Anesthesia Preprocedure Evaluation (Addendum)
Anesthesia Evaluation  Patient identified by MRN, date of birth, ID band Patient awake    Reviewed: Allergy & Precautions, H&P , NPO status , Patient's Chart, lab work & pertinent test results  Airway Mallampati: II TM Distance: >3 FB Neck ROM: Full    Dental  (+) Poor Dentition and Dental Advisory Given   Pulmonary neg pulmonary ROS, shortness of breath,  breath sounds clear to auscultation  Pulmonary exam normal       Cardiovascular hypertension, Pt. on medications Rhythm:Regular Rate:Normal     Neuro/Psych  Headaches, Depression  Neuromuscular disease negative neurological ROS  negative psych ROS   GI/Hepatic negative GI ROS, Neg liver ROS,   Endo/Other  negative endocrine ROS  Renal/GU Renal disease  negative genitourinary   Musculoskeletal negative musculoskeletal ROS (+)   Abdominal   Peds  Hematology negative hematology ROS (+)   Anesthesia Other Findings Multiple broken and missing teeth. Very poor dentition.  Reproductive/Obstetrics                          Anesthesia Physical Anesthesia Plan  ASA: II  Anesthesia Plan: General   Post-op Pain Management:    Induction: Intravenous  Airway Management Planned: LMA  Additional Equipment:   Intra-op Plan:   Post-operative Plan: Extubation in OR  Informed Consent: I have reviewed the patients History and Physical, chart, labs and discussed the procedure including the risks, benefits and alternatives for the proposed anesthesia with the patient or authorized representative who has indicated his/her understanding and acceptance.   Dental advisory given  Plan Discussed with: CRNA  Anesthesia Plan Comments:         Anesthesia Quick Evaluation

## 2013-03-20 ENCOUNTER — Ambulatory Visit (HOSPITAL_COMMUNITY)
Admission: RE | Admit: 2013-03-20 | Discharge: 2013-03-21 | Disposition: A | Discharge status: Home / Self Care | Payer: BC Managed Care – PPO | Source: Ambulatory Visit | Attending: Urology | Admitting: Urology

## 2013-03-20 ENCOUNTER — Encounter (HOSPITAL_COMMUNITY)
Admission: RE | Disposition: A | Discharge status: Home / Self Care | Payer: Self-pay | Source: Ambulatory Visit | Attending: Urology

## 2013-03-20 ENCOUNTER — Ambulatory Visit (HOSPITAL_COMMUNITY): Payer: BC Managed Care – PPO | Admitting: Anesthesiology

## 2013-03-20 ENCOUNTER — Encounter (HOSPITAL_COMMUNITY): Payer: Self-pay | Admitting: *Deleted

## 2013-03-20 ENCOUNTER — Encounter (HOSPITAL_COMMUNITY): Payer: BC Managed Care – PPO | Admitting: Anesthesiology

## 2013-03-20 DIAGNOSIS — N138 Other obstructive and reflux uropathy: Secondary | ICD-10-CM | POA: Insufficient documentation

## 2013-03-20 DIAGNOSIS — I10 Essential (primary) hypertension: Secondary | ICD-10-CM | POA: Insufficient documentation

## 2013-03-20 DIAGNOSIS — N4289 Other specified disorders of prostate: Secondary | ICD-10-CM | POA: Insufficient documentation

## 2013-03-20 DIAGNOSIS — Z79899 Other long term (current) drug therapy: Secondary | ICD-10-CM | POA: Insufficient documentation

## 2013-03-20 DIAGNOSIS — N320 Bladder-neck obstruction: Secondary | ICD-10-CM

## 2013-03-20 DIAGNOSIS — E785 Hyperlipidemia, unspecified: Secondary | ICD-10-CM | POA: Insufficient documentation

## 2013-03-20 DIAGNOSIS — R339 Retention of urine, unspecified: Secondary | ICD-10-CM | POA: Insufficient documentation

## 2013-03-20 DIAGNOSIS — N401 Enlarged prostate with lower urinary tract symptoms: Secondary | ICD-10-CM | POA: Insufficient documentation

## 2013-03-20 HISTORY — PX: TRANSURETHRAL RESECTION OF PROSTATE: SHX73

## 2013-03-20 LAB — HEMOGLOBIN AND HEMATOCRIT, BLOOD
HCT: 39.1 % (ref 39.0–52.0)
Hemoglobin: 13.5 g/dL (ref 13.0–17.0)

## 2013-03-20 SURGERY — TRANSURETHRAL RESECTION OF THE PROSTATE WITH GYRUS INSTRUMENTS
Anesthesia: General | Wound class: Clean Contaminated

## 2013-03-20 MED ORDER — CEFAZOLIN SODIUM-DEXTROSE 2-3 GM-% IV SOLR
INTRAVENOUS | Status: AC
  Filled 2013-03-20: qty 50

## 2013-03-20 MED ORDER — BACITRACIN-NEOMYCIN-POLYMYXIN 400-5-5000 EX OINT
1.0000 "application " | TOPICAL_OINTMENT | Freq: Three times a day (TID) | CUTANEOUS | Status: DC | PRN
  Administered 2013-03-20: 1 via TOPICAL
  Filled 2013-03-20: qty 1

## 2013-03-20 MED ORDER — BELLADONNA ALKALOIDS-OPIUM 16.2-60 MG RE SUPP: 1.0000 | Freq: Four times a day (QID) | RECTAL | Status: DC | PRN

## 2013-03-20 MED ORDER — HYDROMORPHONE HCL PF 1 MG/ML IJ SOLN: 0.2500 mg | INTRAMUSCULAR | Status: DC | PRN

## 2013-03-20 MED ORDER — PIROXICAM 10 MG PO CAPS
10.0000 mg | ORAL_CAPSULE | Freq: Every morning | ORAL | Status: DC
  Administered 2013-03-20 – 2013-03-21 (×2): 10 mg via ORAL
  Filled 2013-03-20 (×3): qty 1

## 2013-03-20 MED ORDER — TAMSULOSIN HCL 0.4 MG PO CAPS
0.4000 mg | ORAL_CAPSULE | Freq: Two times a day (BID) | ORAL | Status: DC
  Administered 2013-03-20 – 2013-03-21 (×3): 0.4 mg via ORAL
  Filled 2013-03-20 (×4): qty 1

## 2013-03-20 MED ORDER — MENTHOL 3 MG MT LOZG
1.0000 | LOZENGE | OROMUCOSAL | Status: DC | PRN
  Administered 2013-03-20: 3 mg via ORAL
  Filled 2013-03-20: qty 9

## 2013-03-20 MED ORDER — DOXYCYCLINE HYCLATE 100 MG PO TABS
100.0000 mg | ORAL_TABLET | Freq: Two times a day (BID) | ORAL | Status: DC
  Administered 2013-03-20 – 2013-03-21 (×3): 100 mg via ORAL
  Filled 2013-03-20 (×4): qty 1

## 2013-03-20 MED ORDER — LACTATED RINGERS IV SOLN
INTRAVENOUS | Status: DC | PRN
  Administered 2013-03-20 (×2): via INTRAVENOUS

## 2013-03-20 MED ORDER — BACITRACIN-NEOMYCIN-POLYMYXIN 400-5-5000 EX OINT: 1.0000 "application " | TOPICAL_OINTMENT | Freq: Three times a day (TID) | CUTANEOUS | Status: DC | PRN

## 2013-03-20 MED ORDER — SODIUM CHLORIDE 0.9 % IV SOLN
10.0000 mg | INTRAVENOUS | Status: DC | PRN
  Administered 2013-03-20: 20 ug/min via INTRAVENOUS

## 2013-03-20 MED ORDER — ONDANSETRON HCL 4 MG/2ML IJ SOLN
INTRAMUSCULAR | Status: DC | PRN
  Administered 2013-03-20: 4 mg via INTRAVENOUS

## 2013-03-20 MED ORDER — PROMETHAZINE HCL 25 MG/ML IJ SOLN: 6.2500 mg | INTRAMUSCULAR | Status: DC | PRN

## 2013-03-20 MED ORDER — LIDOCAINE HCL 2 % EX GEL
CUTANEOUS | Status: DC | PRN
  Administered 2013-03-20: 1 via URETHRAL
  Administered 2013-03-20 – 2013-03-21 (×3): 5 via URETHRAL
  Filled 2013-03-20 (×2): qty 5

## 2013-03-20 MED ORDER — SODIUM CHLORIDE 0.9 % IR SOLN
Status: DC | PRN
  Administered 2013-03-20: 12000 mL via INTRAVESICAL

## 2013-03-20 MED ORDER — DEXAMETHASONE SODIUM PHOSPHATE 10 MG/ML IJ SOLN
INTRAMUSCULAR | Status: DC | PRN
  Administered 2013-03-20: 10 mg via INTRAVENOUS

## 2013-03-20 MED ORDER — MORPHINE SULFATE 2 MG/ML IJ SOLN: 2.0000 mg | INTRAMUSCULAR | Status: DC | PRN

## 2013-03-20 MED ORDER — CYCLOBENZAPRINE HCL 10 MG PO TABS
10.0000 mg | ORAL_TABLET | Freq: Three times a day (TID) | ORAL | Status: DC | PRN
  Filled 2013-03-20: qty 1

## 2013-03-20 MED ORDER — PROPOFOL 10 MG/ML IV BOLUS
INTRAVENOUS | Status: DC | PRN
  Administered 2013-03-20: 200 mg via INTRAVENOUS

## 2013-03-20 MED ORDER — OXYCODONE HCL 5 MG PO TABS
5.0000 mg | ORAL_TABLET | ORAL | Status: DC | PRN
  Administered 2013-03-20 – 2013-03-21 (×2): 5 mg via ORAL
  Filled 2013-03-20 (×2): qty 1

## 2013-03-20 MED ORDER — SENNOSIDES-DOCUSATE SODIUM 8.6-50 MG PO TABS
1.0000 | ORAL_TABLET | Freq: Two times a day (BID) | ORAL | Status: DC
  Administered 2013-03-20 – 2013-03-21 (×3): 1 via ORAL
  Filled 2013-03-20 (×4): qty 1

## 2013-03-20 MED ORDER — LACTATED RINGERS IV SOLN: INTRAVENOUS | Status: DC

## 2013-03-20 MED ORDER — ADULT MULTIVITAMIN W/MINERALS CH
1.0000 | ORAL_TABLET | Freq: Every morning | ORAL | Status: DC
  Administered 2013-03-20: 1 via ORAL
  Filled 2013-03-20 (×2): qty 1

## 2013-03-20 MED ORDER — PHENOL 1.4 % MT LIQD
1.0000 | OROMUCOSAL | Status: DC | PRN
  Filled 2013-03-20: qty 177

## 2013-03-20 MED ORDER — FENTANYL CITRATE 0.05 MG/ML IJ SOLN
INTRAMUSCULAR | Status: DC | PRN
  Administered 2013-03-20 (×6): 25 ug via INTRAVENOUS

## 2013-03-20 MED ORDER — LIDOCAINE HCL (CARDIAC) 20 MG/ML IV SOLN
INTRAVENOUS | Status: DC | PRN
  Administered 2013-03-20: 80 mg via INTRAVENOUS

## 2013-03-20 MED ORDER — ATORVASTATIN CALCIUM 40 MG PO TABS
40.0000 mg | ORAL_TABLET | ORAL | Status: DC
  Administered 2013-03-21: 40 mg via ORAL
  Filled 2013-03-20: qty 1

## 2013-03-20 MED ORDER — MIDAZOLAM HCL 5 MG/5ML IJ SOLN
INTRAMUSCULAR | Status: DC | PRN
  Administered 2013-03-20: 2 mg via INTRAVENOUS

## 2013-03-20 MED ORDER — VITAMIN D3 25 MCG (1000 UNIT) PO TABS
1000.0000 [IU] | ORAL_TABLET | Freq: Every morning | ORAL | Status: DC
  Administered 2013-03-20 – 2013-03-21 (×2): 1000 [IU] via ORAL
  Filled 2013-03-20 (×2): qty 1

## 2013-03-20 MED ORDER — PHENYLEPHRINE HCL 10 MG/ML IJ SOLN
INTRAMUSCULAR | Status: DC | PRN
  Administered 2013-03-20: 80 ug via INTRAVENOUS
  Administered 2013-03-20: 40 ug via INTRAVENOUS
  Administered 2013-03-20 (×2): 80 ug via INTRAVENOUS

## 2013-03-20 MED ORDER — ONDANSETRON HCL 4 MG/2ML IJ SOLN: 4.0000 mg | INTRAMUSCULAR | Status: DC | PRN

## 2013-03-20 MED ORDER — CEFAZOLIN SODIUM-DEXTROSE 2-3 GM-% IV SOLR
2.0000 g | Freq: Three times a day (TID) | INTRAVENOUS | Status: AC
  Administered 2013-03-20 (×2): 2 g via INTRAVENOUS
  Filled 2013-03-20 (×2): qty 50

## 2013-03-20 MED ORDER — EPHEDRINE SULFATE 50 MG/ML IJ SOLN
INTRAMUSCULAR | Status: DC | PRN
  Administered 2013-03-20: 5 mg via INTRAVENOUS
  Administered 2013-03-20: 10 mg via INTRAVENOUS
  Administered 2013-03-20: 5 mg via INTRAVENOUS

## 2013-03-20 MED ORDER — SODIUM CHLORIDE 0.9 % IV SOLN
INTRAVENOUS | Status: DC
  Administered 2013-03-20: 11:00:00 via INTRAVENOUS

## 2013-03-20 MED ORDER — CEFAZOLIN SODIUM-DEXTROSE 2-3 GM-% IV SOLR
2.0000 g | INTRAVENOUS | Status: AC
  Administered 2013-03-20: 2 g via INTRAVENOUS

## 2013-03-20 MED ORDER — SODIUM CHLORIDE 0.9 % IR SOLN
3000.0000 mL | Status: DC
  Administered 2013-03-20: 3000 mL

## 2013-03-20 SURGICAL SUPPLY — 33 items
BAG URINE DRAINAGE (UROLOGICAL SUPPLIES) ×1 IMPLANT
BAG URO CATCHER STRL LF (DRAPE) ×2 IMPLANT
BLADE SURG 15 STRL LF DISP TIS (BLADE) IMPLANT
BLADE SURG 15 STRL SS (BLADE)
CATH COUDE 24FR 5CC (CATHETERS) IMPLANT
CATH HEMA 3WAY 30CC 24FR COUDE (CATHETERS) ×1 IMPLANT
CATH RIBBED COUDE  30CC (CATHETERS)
CATH RIBBED COUDE 30CC (CATHETERS)
CATH URET 5FR 28IN OPEN ENDED (CATHETERS) IMPLANT
CLOTH BEACON ORANGE TIMEOUT ST (SAFETY) ×2 IMPLANT
DRAPE CAMERA CLOSED 9X96 (DRAPES) ×2 IMPLANT
ELECT LOOP MED HF 24F 12D (CUTTING LOOP) ×1 IMPLANT
ELECT LOOP MED HF 24F 12D CBL (CLIP) ×1 IMPLANT
ELECT REM PT RETURN 9FT ADLT (ELECTROSURGICAL) ×2
ELECT RESECT VAPORIZE 12D CBL (ELECTRODE) ×1 IMPLANT
ELECTRODE REM PT RTRN 9FT ADLT (ELECTROSURGICAL) ×1 IMPLANT
GLOVE BIOGEL M 7.0 STRL (GLOVE) ×4 IMPLANT
GOWN PREVENTION PLUS LG XLONG (DISPOSABLE) ×2 IMPLANT
GUIDEWIRE STR DUAL SENSOR (WIRE) ×1 IMPLANT
HOLDER FOLEY CATH W/STRAP (MISCELLANEOUS) ×1 IMPLANT
IV NS IRRIG 3000ML ARTHROMATIC (IV SOLUTION) ×1 IMPLANT
KIT ASPIRATION TUBING (SET/KITS/TRAYS/PACK) ×2 IMPLANT
KIT SUPRAPUBIC CATH (MISCELLANEOUS) IMPLANT
MANIFOLD NEPTUNE II (INSTRUMENTS) ×2 IMPLANT
NDL SPNL 18GX3.5 QUINCKE PK (NEEDLE) IMPLANT
NEEDLE SPNL 18GX3.5 QUINCKE PK (NEEDLE) IMPLANT
NS IRRIG 1000ML POUR BTL (IV SOLUTION) ×2 IMPLANT
PACK CYSTO (CUSTOM PROCEDURE TRAY) ×2 IMPLANT
SCRUB PCMX 4 OZ (MISCELLANEOUS) IMPLANT
SUT ETHILON 3 0 PS 1 (SUTURE) IMPLANT
SYR 30ML LL (SYRINGE) IMPLANT
SYRINGE IRR TOOMEY STRL 70CC (SYRINGE) ×2 IMPLANT
TUBING CONNECTING 10 (TUBING) ×2 IMPLANT

## 2013-03-20 NOTE — Op Note (Signed)
Urology Operative Report  Date of Procedure: 03/20/13  Surgeon: Natalia Leatherwood, MD Assistant:  None  Preoperative Diagnosis: Bladder outlet obstruction Postoperative Diagnosis:  Same  Procedure(s): Transurethral resection of the prostate. Cystoscopy  Estimated blood loss: 20cc  Specimen: Sent to path (prostate chips)  Drains: 24Fr 3-way catheter.  Complications: None  Findings: High riding bladder neck. Patulous right ureter orifice.  History of present illness: 62 year old male presented with urinary retention several months ago. He failed several voiding trials. Dynamics revealed a week in the bladder but it was able to sustain contractions. It appeared he had bladder outlet obstruction. He presents today for transurethral resection of the prostate.   Procedure in detail: After informed consent was obtained, the patient was taken to the operating room. They were placed in the supine position. SCDs were turned on and in place. IV antibiotics were infused, and general anesthesia was induced. A timeout was performed in which the correct patient, surgical site, and procedure were identified and agreed upon by the team.  The patient was placed in a dorsolithotomy position, making sure to pad all pertinent neurovascular pressure points. The genitals were prepped and draped in the usual sterile fashion.  The visual obturator to the gyrus resectoscope was placed through the urethra and into the bladder. He was noted to have a high riding bladder neck. The bladder was evaluated in a systematic fashion to ensure the entire surface of the bladder was visualized. This was negative for tumors. I then located the ureter orifices bilaterally. The right ureter orifice appeared to be patulous. The left ureter orifice was normal. I used the gyrus resectoscope with normal saline irrigation. I used the gyrus dissection loupe to cauterize the mucosa distal to the ureter orifices to ensure that I would  not resect close to these. These were well away from the bladder neck. Resection was then carried out by resecting at the 6:00 position from the bladder neck to the proximal verumontanum. A groove was then created on the verumontanum either side. The verumontanum was spared. I then resected down to the capsule on the floor of the prostate. I then carried the resection up on the left lateral wall. All resection was maintained proximal to the urethral sphincter to avoid injury. This resection was carried down to the capsule of the prostate on the left lateral lobe. The same was done on the right lateral lobe by resecting from the bladder neck distally to the proximal verumontanum. This was resected down to the prostate capsule. There was no undermining of the bladder neck noted. Review of the resection revealed all resection was proximal to the urethral sphincter which was spared. There was a good open channel at this point. All the prostate chips were removed from the bladder and a 24 French coud-tip 3-way catheter was placed with ease. This was inflated with 30 cc of sterile water and hand irrigated to light clear. There is no return of any prostate chips. The prostate tissue was then sent for pathology. The Foley catheter was then connected to continuous bladder irrigation.  This completed the procedure. Anesthesia was reversed after he was placed in a supine position and he was taken to the PACU in stable condition. He'll be kept overnight for observation and percutaneous bladder irrigation.  All counts were correct at the end of the case.

## 2013-03-20 NOTE — H&P (Signed)
Urology History and Physical Exam  CC: Bladder outlet obstruction.  HPI:  62 year old male presents today for bladder outlet obstruction.  This has resulted in urinary retention.  This occurred in August 2014.  He failed multiple voiding trials.  He initially presented with acute renal failure on top of chronic renal insufficiency.  He had a Foley catheter placed and was started on Flomax.  He failed voiding trials and this was increased to 2 times per day.  He continued to failed voiding trials.  He underwent urodynamics 03/05/13 which showed a normal capacity stable bladder which was weak but able to maintain a sustained contraction.  It was also noted that he had bilateral ureteral reflux.  He had a maximum flow rate of 5cc/sec with a large post-void residual. Transrectal ultrasound of his prostate revealed a volume 30 cc.  He was screened for prostate cancer with a PSA of 1.8 in August 2014.  He presents today for transurethral resection of the prostate.  We have discussed the risks, benefits, alternatives, and likelihood of achieving goals.  Urine culture 02/15/13 revealed Staphylococcus urinary tract infection which was treated appropriately.  He was restarted on doxycycline 3 days prior to this surgery in preparation for surgery at his indwelling catheter because it would have further bacteria in his urine.  PMH: Past Medical History  Diagnosis Date  . Hyperlipidemia   . HTN (hypertension)   . Allergic rhinitis   . Headache(784.0)   . Arthritis   . Nocturia   . Urinary retention   . Foley catheter in place   . UTI (urinary tract infection)   . Depression     PSH: Past Surgical History  Procedure Laterality Date  . Hernia repair    . Nissen fundoplication    . Laparoscopic lysis intestinal adhesions      Allergies: No Known Allergies  Medications: Prescriptions prior to admission  Medication Sig Dispense Refill  . atorvastatin (LIPITOR) 80 MG tablet Take 40 mg by mouth 3  (three) times a week. He takes on Tuesday, Thursday and Saturday in the morning.      . B Complex-C (SUPER B COMPLEX PO) Take 1 tablet by mouth every morning.      . cholecalciferol (VITAMIN D) 1000 UNITS tablet Take 1,000 Units by mouth every morning.       . cyclobenzaprine (FLEXERIL) 10 MG tablet Take 10 mg by mouth 3 (three) times daily as needed for muscle spasms.      Marland Kitchen doxycycline (VIBRAMYCIN) 100 MG capsule Take 100 mg by mouth 2 (two) times daily. He is to take for 6 days starting three days before to his surgery on 03/20/13.      . fish oil-omega-3 fatty acids 1000 MG capsule Take 1,000 mg by mouth every morning.       . Multiple Vitamin (MULTIVITAMIN WITH MINERALS) TABS tablet Take 1 tablet by mouth every morning.       . piroxicam (FELDENE) 10 MG capsule Take 10 mg by mouth every morning.       Marland Kitchen RASPBERRY KETONES PO Take 1 tablet by mouth every morning.      . tamsulosin (FLOMAX) 0.4 MG CAPS capsule Take 0.4 mg by mouth 2 (two) times daily.         Social History: History   Social History  . Marital Status: Widowed    Spouse Name: N/A    Number of Children: N/A  . Years of Education: N/A   Occupational History  .  Not on file.   Social History Main Topics  . Smoking status: Passive Smoke Exposure - Never Smoker -- 34 years  . Smokeless tobacco: Former Neurosurgeon    Types: Chew    Quit date: 01/03/1978  . Alcohol Use: No  . Drug Use: No  . Sexual Activity: Not on file   Other Topics Concern  . Not on file   Social History Narrative  . No narrative on file    Family History: Family History  Problem Relation Age of Onset  . Colon cancer Mother   . Prostate cancer Father     Review of Systems: Positive: Knee (bilateral) pain; joint pain, neck pain. Negative: Fever, SOB, or chest pain.  A further 10 point review of systems was negative except what is listed in the HPI.  Physical Exam: Filed Vitals:   03/20/13 0613  BP: 137/95  Pulse: 98  Temp: 98.3 F (36.8  C)  Resp: 18    General: No acute distress.  Awake. Head:  Normocephalic.  Atraumatic. ENT:  EOMI.  Mucous membranes moist Neck:  Supple.  No lymphadenopathy. CV:  S1 present. S2 present. Regular rate. Pulmonary: Equal effort bilaterally.  Clear to auscultation bilaterally. Abdomen: Soft.  Non- tender to palpation. Skin:  Normal turgor.  No visible rash. Extremity: No gross deformity of bilateral upper extremities.  No gross deformity of    bilateral lower extremities. Neurologic: Alert. Appropriate mood.  .  Studies:  Recent Labs     03/18/13  0935  HGB  14.8  WBC  6.3  PLT  150    Recent Labs     03/18/13  0935  NA  140  K  4.1  CL  102  CO2  30  BUN  21  CREATININE  1.68*  CALCIUM  10.4  GFRNONAA  41*  GFRAA  48*     No results found for this basename: PT, INR, APTT,  in the last 72 hours   No components found with this basename: ABG,     Assessment:  Bladder outlet obstruction.  Plan: To OR for transurethral resection of the prostate.

## 2013-03-20 NOTE — Preoperative (Signed)
Beta Blockers   Reason not to administer Beta Blockers:Not Applicable 

## 2013-03-20 NOTE — Progress Notes (Signed)
GU post-op check  Patient doing well. Has discomfort at penile meatus. Negative abdominal pain.  We discussed the surgical findings and course of surgery.  Filed Vitals:   03/20/13 1747  BP: 128/85  Pulse: 115  Temp: 98.7 F (37.1 C)  Resp: 18   Gen: NAD Abd: soft, ND, NTTP GU: foley draining pink urine on slow CBI drip  Hgb 13.5  A/P: Bladder outlet obstruction. -CBI overnight; stop at 05:00. -Likely discharge home tomorrow.

## 2013-03-20 NOTE — Transfer of Care (Signed)
Immediate Anesthesia Transfer of Care Note  Patient: [name]  Procedure(s) Performed: Procedure(s) (LRB): TRANSURETHRAL RESECTION OF THE PROSTATE WITH GYRUS INSTRUMENTS (N/A)  Patient Location: PACU  Anesthesia Type: General  Level of Consciousness: sedated, patient cooperative and responds to stimulation  Airway & Oxygen Therapy: Patient Spontanous Breathing and Patient connected to face mask oxgen  Post-op Assessment: Report given to PACU RN and Post -op Vital signs reviewed and stable  Post vital signs: Reviewed and stable  Complications: No apparent anesthesia complications

## 2013-03-20 NOTE — Anesthesia Postprocedure Evaluation (Signed)
Anesthesia Post Note  Patient: [name]  Procedure(s) Performed: Procedure(s) (LRB): TRANSURETHRAL RESECTION OF THE PROSTATE WITH GYRUS INSTRUMENTS (N/A)  Anesthesia type: General  Patient location: PACU  Post pain: Pain level controlled  Post assessment: Post-op Vital signs reviewed  Last Vitals:  Filed Vitals:   03/20/13 1105  BP: 142/97  Pulse: 64  Temp: 36.6 C  Resp: 10    Post vital signs: Reviewed  Level of consciousness: sedated  Complications: No apparent anesthesia complications

## 2013-03-21 ENCOUNTER — Other Ambulatory Visit: Payer: Self-pay

## 2013-03-21 ENCOUNTER — Encounter (HOSPITAL_COMMUNITY): Payer: Self-pay | Admitting: Urology

## 2013-03-21 MED ORDER — SENNOSIDES-DOCUSATE SODIUM 8.6-50 MG PO TABS: 1.0000 | ORAL_TABLET | Freq: Two times a day (BID) | ORAL | Status: DC

## 2013-03-21 MED ORDER — OXYCODONE-ACETAMINOPHEN 5-325 MG PO TABS: 1.0000 | ORAL_TABLET | ORAL | Status: DC | PRN

## 2013-03-21 MED ORDER — LIDOCAINE HCL 2 % EX GEL: CUTANEOUS | Status: DC | PRN

## 2013-03-21 MED ORDER — HYOSCYAMINE SULFATE 0.125 MG PO TABS: 0.1250 mg | ORAL_TABLET | ORAL | Status: DC | PRN

## 2013-03-21 NOTE — Progress Notes (Signed)
Urology Progress Note  Subjective:     No acute urologic events overnight. Negative abdominal pain. Urethral meatal discomfort improved w/ medication.  ROS: Negative: Chest pain or SOB  Objective:  Patient Vitals for the past 24 hrs:  BP Temp Temp src Pulse Resp SpO2 Height Weight  03/21/13 0618 124/76 mmHg 97.5 F (36.4 C) Oral 71 18 97 % - -  03/21/13 0258 113/76 mmHg 97.9 F (36.6 C) Oral 75 18 99 % - -  03/20/13 2204 121/74 mmHg 98 F (36.7 C) Oral 95 18 98 % - -  03/20/13 1747 128/85 mmHg 98.7 F (37.1 C) Oral 115 18 97 % - -  03/20/13 1518 130/84 mmHg 98.5 F (36.9 C) Oral 119 17 97 % - -  03/20/13 1151 - - - - - - 6\' 1"  (1.854 m) 88.5 kg (195 lb 1.7 oz)  03/20/13 1105 142/97 mmHg 97.9 F (36.6 C) - 64 10 100 % - -  03/20/13 1045 134/80 mmHg 97.5 F (36.4 C) - 69 8 100 % - -  03/20/13 1030 131/90 mmHg - - 81 13 100 % - -  03/20/13 1015 136/85 mmHg - - 90 15 100 % - -  03/20/13 1006 142/93 mmHg 97.8 F (36.6 C) - 89 12 100 % - -    Physical Exam: General:  No acute distress, awake Cardiovascular:    [x]   S1/S2 present, RRR  []   Irregularly irregular Chest:  CTA-B Abdomen:               []  Soft, appropriately TTP  [x]  Soft, NTTP  []  Soft, appropriately TTP, incision(s) clean/dry/intact  Genitourinary: Foley in place. CBI turned off. Urine clear to dark pink.     I/O last 3 completed shifts: In: 9125.8 [P.O.:240; I.V.:2035.8; Other:6600; IV Piggyback:250] Out: 16109 [Urine:10725; Blood:20]  Recent Labs     03/18/13  0935  03/20/13  1026  HGB  14.8  13.5  WBC  6.3   --   PLT  150   --     Recent Labs     03/18/13  0935  NA  140  K  4.1  CL  102  CO2  30  BUN  21  CREATININE  1.68*  CALCIUM  10.4  GFRNONAA  41*  GFRAA  48*     No results found for this basename: PT, INR, APTT,  in the last 72 hours   No components found with this basename: ABG,     Length of stay: 1 days.  Assessment: Bladder outlet obstruction. POD#1  TURP   Plan: -Discharge home. -Patient given discharge instructions.  -Restart doxycycline day before f/u for cath removal. -Nurse to give cath instructions.   Natalia Leatherwood, MD [phone]

## 2013-03-21 NOTE — Discharge Summary (Signed)
Physician Discharge Summary  Patient ID: [name] MRN: [id number] DOB/AGE: 64-Apr-1952 64 y.o.  Admit date: 03/20/2013 Discharge date: 03/21/2013  Admission Diagnoses:  Discharge Diagnoses:  Active Problems:   * No active hospital problems. *   Discharged Condition: good  Hospital Course:  This patient was admitted for continuous bladder irrigation following transurethral resection of the prostate.  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  The patient had continuous bladder irrigation which was titrated to low and turned off early in the morning on postoperative day 1.  I checked on him over and hour later and his urine remained light pink.  He is able to ambulate and control his discomfort with medication.  His main complaint was discomfort at the urethral meatus.  He was able to tolerate a regular diet.  He had been living with the catheter for the past 2 months.  He was given additional instructions regarding his catheter by the nurse.  It was felt that he could be discharged home.  He was instructed to restart his doxycycline the day prior to his Foley catheter removal the following Monday.  Consults: None  Significant Diagnostic Studies: None  Treatments: surgery: Transurethral resection of prostate.  Continuous bladder irrigation.  Discharge Exam: Blood pressure 124/76, pulse 71, temperature 97.5 F (36.4 C), temperature source Oral, resp. rate 18, height 6\' 1"  (1.854 m), weight 88.5 kg (195 lb 1.7 oz), SpO2 97.00%. Refer to PE from progress note on date of discharge.  Disposition: 01-Home or Self Care  Discharge Orders   Future Orders Complete By Expires   Discharge patient  As directed        Medication List    STOP taking these medications       fish oil-omega-3 fatty acids 1000 MG capsule      TAKE these medications       atorvastatin 80 MG tablet  Commonly known as:  LIPITOR  Take 40 mg by mouth 3 (three) times a week.  He takes on Tuesday, Thursday and Saturday in the morning.     cholecalciferol 1000 UNITS tablet  Commonly known as:  VITAMIN D  Take 1,000 Units by mouth every morning.     cyclobenzaprine 10 MG tablet  Commonly known as:  FLEXERIL  Take 10 mg by mouth 3 (three) times daily as needed for muscle spasms.     doxycycline 100 MG capsule  Commonly known as:  VIBRAMYCIN  Take 100 mg by mouth 2 (two) times daily. He is to take for 6 days starting three days before to his surgery on 03/20/13.     hyoscyamine 0.125 MG tablet  Commonly known as:  LEVSIN, ANASPAZ  Take 1 tablet (0.125 mg total) by mouth every 4 (four) hours as needed (bladder spasms).     lidocaine 2 % jelly  Commonly known as:  XYLOCAINE  Place into the urethra as needed (penile irritation).     multivitamin with minerals Tabs tablet  Take 1 tablet by mouth every morning.     oxyCODONE-acetaminophen 5-325 MG per tablet  Commonly known as:  PERCOCET  Take 1-2 tablets by mouth every 4 (four) hours as needed.     piroxicam 10 MG capsule  Commonly known as:  FELDENE  Take 10 mg by mouth every morning.     RASPBERRY KETONES PO  Take 1 tablet by mouth every morning.     senna-docusate 8.6-50 MG per tablet  Commonly known as:  [employer]  S  Take 1 tablet by mouth 2 (two) times daily.     SUPER B COMPLEX PO  Take 1 tablet by mouth every morning.     tamsulosin 0.4 MG Caps capsule  Commonly known as:  FLOMAX  Take 0.4 mg by mouth 2 (two) times daily.           Follow-up Information   Follow up with Milford Cage, MD On 03/25/2013. (12:00 noon.)    Specialty:  Urology   Contact information:   [address] [place] FLOOR [address], [place] [address] [phone]       Signed: Milford Cage 03/21/2013, 7:26 AM

## 2013-03-21 NOTE — Progress Notes (Signed)
CBI stopped at 0500

## 2013-05-05 ENCOUNTER — Encounter: Payer: Self-pay | Admitting: Internal Medicine

## 2013-05-06 ENCOUNTER — Encounter: Payer: Self-pay | Admitting: Emergency Medicine

## 2013-05-06 ENCOUNTER — Ambulatory Visit (INDEPENDENT_AMBULATORY_CARE_PROVIDER_SITE_OTHER): Payer: BC Managed Care – PPO | Admitting: Emergency Medicine

## 2013-05-06 VITALS — BP 142/86 | HR 80 | Temp 98.4°F | Resp 18 | Ht 72.5 in | Wt 200.0 lb

## 2013-05-06 DIAGNOSIS — E782 Mixed hyperlipidemia: Secondary | ICD-10-CM | POA: Insufficient documentation

## 2013-05-06 DIAGNOSIS — R7309 Other abnormal glucose: Secondary | ICD-10-CM

## 2013-05-06 DIAGNOSIS — I10 Essential (primary) hypertension: Secondary | ICD-10-CM

## 2013-05-06 LAB — HEPATIC FUNCTION PANEL
ALT: 16 U/L (ref 0–53)
AST: 17 U/L (ref 0–37)
Albumin: 4 g/dL (ref 3.5–5.2)
Alkaline Phosphatase: 47 U/L (ref 39–117)
Total Bilirubin: 0.4 mg/dL (ref 0.3–1.2)

## 2013-05-06 LAB — HEMOGLOBIN A1C
Hgb A1c MFr Bld: 6 % — ABNORMAL HIGH (ref <5.7)
Mean Plasma Glucose: 126 mg/dL — ABNORMAL HIGH (ref <117)

## 2013-05-06 LAB — LIPID PANEL
Cholesterol: 126 mg/dL (ref 0–200)
HDL: 51 mg/dL (ref >39)
Total CHOL/HDL Ratio: 2.5 Ratio
VLDL: 27 mg/dL (ref 0–40)

## 2013-05-06 LAB — CBC WITH DIFFERENTIAL/PLATELET
Basophils Absolute: 0 10*3/uL (ref 0.0–0.1)
Basophils Relative: 0 % (ref 0–1)
Eosinophils Absolute: 0.2 10*3/uL (ref 0.0–0.7)
Eosinophils Relative: 3 % (ref 0–5)
HCT: 34 % — ABNORMAL LOW (ref 39.0–52.0)
Hemoglobin: 12 g/dL — ABNORMAL LOW (ref 13.0–17.0)
MCH: 32 pg (ref 26.0–34.0)
MCHC: 35.3 g/dL (ref 30.0–36.0)
MCV: 90.7 fL (ref 78.0–100.0)
Monocytes Absolute: 0.4 10*3/uL (ref 0.1–1.0)
Monocytes Relative: 7 % (ref 3–12)
RBC: 3.75 MIL/uL — ABNORMAL LOW (ref 4.22–5.81)
RDW: 13.8 % (ref 11.5–15.5)

## 2013-05-06 LAB — BASIC METABOLIC PANEL WITH GFR
BUN: 19 mg/dL (ref 6–23)
CO2: 28 mEq/L (ref 19–32)
Calcium: 8.8 mg/dL (ref 8.4–10.5)
Chloride: 104 mEq/L (ref 96–112)
Creat: 1.57 mg/dL — ABNORMAL HIGH (ref 0.50–1.35)
GFR, Est African American: 53 mL/min — ABNORMAL LOW
GFR, Est Non African American: 46 mL/min — ABNORMAL LOW
Glucose, Bld: 111 mg/dL — ABNORMAL HIGH (ref 70–99)
Potassium: 4 mEq/L (ref 3.5–5.3)

## 2013-05-06 NOTE — Patient Instructions (Signed)
Cholesterol  Cholesterol is a type of fat. Your body needs a small amount of cholesterol, but too much can cause health problems. Certain problems include heart attacks, strokes, and not enough blood flow to your heart, brain, kidneys, or feet. You get cholesterol in 2 ways:   Naturally.   By eating certain foods.  HOME CARE   Eat a low-fat diet:   Eat less eggs, whole dairy products (whole milk, cheese, and butter), fatty meats, and fried foods.   Eat more fruits, vegetables, whole-wheat breads, lean chicken, and fish.   Follow your exercise program as told by your doctor.   Keep your weight at a healthy level. Talk to your doctor about what is right for you.   Only take medicine as told by your doctor.   Get your cholesterol checked once a year or as told by your doctor.  MAKE SURE YOU:   Understand these instructions.   Will watch your condition.   Will get help right away if you are not doing well or get worse.  Document Released: 07/29/2008 Document Revised: 07/25/2011 Document Reviewed: 07/29/2008  [employer] Patient Information 2014 [employer].

## 2013-05-06 NOTE — Progress Notes (Signed)
Subjective:    Patient ID: [name], male    DOB: 04/30/1951, 62 y.o.   MRN: [id number]  HPI Comments: 62 yo male presents for 3 month F/U for HTN, Cholesterol, Pre-Dm, D. Deficient. He is not exercising routinely. He is eating healthier. He has not been exercising due to recent prostate surgery. LAST LABS BS 122 T 124 TG 165 H 41 LDL 50 A1C 6.3 MAG 1.9 D 93 His BP is good at home.   Has f/u Dr Margarita Grizzle to remove catheter from recent TURP. He initially had some complications with clots with urine but has improved significantly.   Hypertension  Hyperlipidemia  Gastrophageal Reflux    Current Outpatient Prescriptions on File Prior to Visit  Medication Sig Dispense Refill  . atorvastatin (LIPITOR) 80 MG tablet Take 40 mg by mouth 3 (three) times a week. He takes on Tuesday, Thursday and Saturday in the morning.      . B Complex-C (SUPER B COMPLEX PO) Take 1 tablet by mouth every morning.      . cholecalciferol (VITAMIN D) 1000 UNITS tablet Take 1,000 Units by mouth every morning.       . cyclobenzaprine (FLEXERIL) 10 MG tablet Take 10 mg by mouth 3 (three) times daily as needed for muscle spasms.      . hyoscyamine (LEVSIN, ANASPAZ) 0.125 MG tablet Take 1 tablet (0.125 mg total) by mouth every 4 (four) hours as needed (bladder spasms).  40 tablet  4  . Multiple Vitamin (MULTIVITAMIN WITH MINERALS) TABS tablet Take 1 tablet by mouth every morning.       . piroxicam (FELDENE) 10 MG capsule Take 10 mg by mouth every morning.       . senna-docusate (SENOKOT S) 8.6-50 MG per tablet Take 1 tablet by mouth 2 (two) times daily.  60 tablet  0  . tamsulosin (FLOMAX) 0.4 MG CAPS capsule Take 0.4 mg by mouth 2 (two) times daily.      Marland Kitchen doxycycline (VIBRAMYCIN) 100 MG capsule Take 100 mg by mouth 2 (two) times daily. He is to take for 6 days starting three days before to his surgery on 03/20/13.      Marland Kitchen lidocaine (XYLOCAINE) 2 % jelly Place into the urethra as needed (penile irritation).  20 mL  3   . oxyCODONE-acetaminophen (PERCOCET) 5-325 MG per tablet Take 1-2 tablets by mouth every 4 (four) hours as needed.  40 tablet  0  . RASPBERRY KETONES PO Take 1 tablet by mouth every morning.       No current facility-administered medications on file prior to visit.    Review of patient's allergies indicates no known allergies.  Past Medical History  Diagnosis Date  . Hyperlipidemia   . HTN (hypertension)   . Allergic rhinitis   . Headache(784.0)   . Arthritis   . Nocturia   . Urinary retention   . Foley catheter in place   . UTI (urinary tract infection)   . Depression      Review of Systems  Genitourinary: Positive for difficulty urinating.  All other systems reviewed and are negative.   BP 142/86  Pulse 80  Temp(Src) 98.4 F (36.9 C) (Oral)  Resp 18  Ht 6' 0.5" (1.842 m)  Wt 200 lb (90.719 kg)  BMI 26.74 kg/m2     Objective:   Physical Exam  Nursing note and vitals reviewed. Constitutional: He is oriented to person, place, and time. He appears well-developed and well-nourished.  HENT:  Head: Normocephalic and atraumatic.  Right Ear: External ear normal.  Left Ear: External ear normal.  Nose: Nose normal.  Mouth/Throat: Oropharynx is clear and moist. No oropharyngeal exudate.  Eyes: Conjunctivae and EOM are normal.  Neck: Normal range of motion. Neck supple. No JVD present. No thyromegaly present.  Cardiovascular: Normal rate, regular rhythm, normal heart sounds and intact distal pulses.   Pulmonary/Chest: Effort normal and breath sounds normal.  Abdominal: Soft. Bowel sounds are normal. He exhibits no distension and no mass. There is no tenderness. There is no rebound and no guarding.  Musculoskeletal: Normal range of motion. He exhibits no edema and no tenderness.  Lymphadenopathy:    He has no cervical adenopathy.  Neurological: He is alert and oriented to person, place, and time. He has normal reflexes. No cranial nerve deficit. Coordination normal.   Skin: Skin is warm and dry.  Psychiatric: He has a normal mood and affect. His behavior is normal. Judgment and thought content normal.          Assessment & Plan:  1.  3 month F/U for HTN, Cholesterol, Pre-Dm, D. Deficient. Needs healthy diet, cardio QD and obtain healthy weight. Check Labs, Check BP if >130/80 call office  Has f/u Dr Margarita Grizzle to remove catheter from recent TURP

## 2013-07-29 ENCOUNTER — Other Ambulatory Visit: Payer: Self-pay | Admitting: Physician Assistant

## 2013-07-29 ENCOUNTER — Other Ambulatory Visit: Payer: Self-pay | Admitting: *Deleted

## 2013-07-29 MED ORDER — ATENOLOL 100 MG PO TABS: 100.0000 mg | ORAL_TABLET | Freq: Every day | ORAL | Status: DC

## 2013-07-29 MED ORDER — ATORVASTATIN CALCIUM 80 MG PO TABS: 80.0000 mg | ORAL_TABLET | ORAL | Status: DC

## 2013-07-29 MED ORDER — ATORVASTATIN CALCIUM 80 MG PO TABS: 80.0000 mg | ORAL_TABLET | ORAL | Status: AC

## 2013-07-29 MED ORDER — ATENOLOL 100 MG PO TABS: 100.0000 mg | ORAL_TABLET | Freq: Every day | ORAL | Status: AC

## 2013-08-02 DIAGNOSIS — K219 Gastro-esophageal reflux disease without esophagitis: Secondary | ICD-10-CM | POA: Insufficient documentation

## 2013-08-02 DIAGNOSIS — R7303 Prediabetes: Secondary | ICD-10-CM | POA: Insufficient documentation

## 2013-08-02 DIAGNOSIS — F32A Depression, unspecified: Secondary | ICD-10-CM | POA: Insufficient documentation

## 2013-08-02 DIAGNOSIS — F329 Major depressive disorder, single episode, unspecified: Secondary | ICD-10-CM | POA: Insufficient documentation

## 2013-08-02 DIAGNOSIS — J309 Allergic rhinitis, unspecified: Secondary | ICD-10-CM | POA: Insufficient documentation

## 2013-08-05 ENCOUNTER — Encounter: Payer: Self-pay | Admitting: Internal Medicine

## 2013-08-05 ENCOUNTER — Ambulatory Visit (INDEPENDENT_AMBULATORY_CARE_PROVIDER_SITE_OTHER): Payer: Medicare HMO | Admitting: Internal Medicine

## 2013-08-05 VITALS — BP 148/88 | HR 60 | Temp 99.1°F | Resp 16 | Ht 72.5 in | Wt 203.2 lb

## 2013-08-05 DIAGNOSIS — Z79899 Other long term (current) drug therapy: Secondary | ICD-10-CM

## 2013-08-05 DIAGNOSIS — E559 Vitamin D deficiency, unspecified: Secondary | ICD-10-CM

## 2013-08-05 DIAGNOSIS — R7309 Other abnormal glucose: Secondary | ICD-10-CM

## 2013-08-05 DIAGNOSIS — R7303 Prediabetes: Secondary | ICD-10-CM

## 2013-08-05 DIAGNOSIS — E782 Mixed hyperlipidemia: Secondary | ICD-10-CM

## 2013-08-05 DIAGNOSIS — I10 Essential (primary) hypertension: Secondary | ICD-10-CM

## 2013-08-05 LAB — CBC WITH DIFFERENTIAL/PLATELET
Basophils Absolute: 0 10*3/uL (ref 0.0–0.1)
Basophils Relative: 0 % (ref 0–1)
EOS PCT: 3 % (ref 0–5)
Eosinophils Absolute: 0.2 10*3/uL (ref 0.0–0.7)
HCT: 45.3 % (ref 39.0–52.0)
HEMOGLOBIN: 15.8 g/dL (ref 13.0–17.0)
Lymphocytes Relative: 25 % (ref 12–46)
Lymphs Abs: 1.9 10*3/uL (ref 0.7–4.0)
MCH: 31.6 pg (ref 26.0–34.0)
MCHC: 34.9 g/dL (ref 30.0–36.0)
MCV: 90.6 fL (ref 78.0–100.0)
MONOS PCT: 10 % (ref 3–12)
Monocytes Absolute: 0.7 10*3/uL (ref 0.1–1.0)
Neutro Abs: 4.6 10*3/uL (ref 1.7–7.7)
Neutrophils Relative %: 62 % (ref 43–77)
Platelets: 139 10*3/uL — ABNORMAL LOW (ref 150–400)
RBC: 5 MIL/uL (ref 4.22–5.81)
RDW: 13.4 % (ref 11.5–15.5)
WBC: 7.4 10*3/uL (ref 4.0–10.5)

## 2013-08-05 MED ORDER — MELOXICAM 15 MG PO TABS: ORAL_TABLET | ORAL | Status: DC

## 2013-08-05 NOTE — Progress Notes (Signed)
Patient ID: [name], male   DOB: Mar 28, 1951, 63 y.o.   MRN: [id number]    This very nice 63 y.o. [place] presents for 3 month follow up with Hypertension, Hyperlipidemia, Pre-Diabetes and Vitamin D Deficiency.    HTN predates since 1998. BP has been controlled at home. Today's BP: 148/88 mmHg. Patient denies any cardiac type chest pain, palpitations, dyspnea/orthopnea/PND, dizziness, claudication, or dependent edema.   Hyperlipidemia is controlled with diet & meds. Last lipids in dec as below are at goal . Patient denies myalgias or other med SE's.  Lab Results  Component Value Date   CHOL 126 05/06/2013   HDL 51 05/06/2013   LDLCALC 48 05/06/2013   TRIG 135 05/06/2013   CHOLHDL 2.5 05/06/2013    Also, the patient has history of PreDiabetes/insulin resistance since     with last A1c of 6.0% in Dec 2014. Patient denies any symptoms of reactive hypoglycemia, diabetic polys, paresthesias or visual blurring.   Further, Patient has history of Vitamin D Deficiency of 30 in 2008 andlast vitamin D was 34 in Sept 2014. Patient supplements vitamin D without any suspected side-effects.  Medication Sig  . atenolol (TENORMIN) 100 MG tablet Take 1 tablet  daily.  Marland Kitchen atorvastatin (LIPITOR) 80 MG tablet Take 1 tablet  3  times a week.  . B Complex-C (SUPER B COMPLEX PO) Take 1 tablet  every morning.  . cholecalciferol (VITAMIN D) 1000 UNITS tablet Take 1,000 Units  every morning. Takes 2000 units daily  . cyclobenzaprine (FLEXERIL) 10 MG tablet Take 10 mg  3 times daily as needed  . Multiple Vitamin  Take 1 tablet  every morning.   Marland Kitchen oxyCODONE-acetaminophen (PERCOCET) 5-325  Take 1-2 tablets by mouth every 4  hours as needed.  . piroxicam (FELDENE) 10 MG capsule Take 10 mg  every morning.   Marland Kitchen RASPBERRY KETONES PO Take 1 tablet  every morning.  . senna-docusate (SENOKOT S) 8.6-50 MG  tablet Take 1 tablet  2 (two) times daily.  . tamsulosin (FLOMAX) 0.4 MG CAPS capsule Take 0.4 mg  2 (two) times daily.       No Known Allergies  PMHx:   Past Medical History  Diagnosis Date  . Hyperlipidemia   . HTN (hypertension)   . Allergic rhinitis   . Headache(784.0)   . Nocturia   . Urinary retention   . Foley catheter in place   . UTI (urinary tract infection)   . GERD (gastroesophageal reflux disease)   . Prediabetes   . Depression   . Arthritis     FHx:    Reviewed / unchanged  SHx:    Reviewed / unchanged   Systems Review: Constitutional: Denies fever, chills, wt changes, headaches, insomnia, fatigue, night sweats, change in appetite. Eyes: Denies redness, blurred vision, diplopia, discharge, itchy, watery eyes.  ENT: Denies discharge, congestion, post nasal drip, epistaxis, sore throat, earache, hearing loss, dental pain, tinnitus, vertigo, sinus pain, snoring.  CV: Denies chest pain, palpitations, irregular heartbeat, syncope, dyspnea, diaphoresis, orthopnea, PND, claudication, edema. Respiratory: denies cough, dyspnea, DOE, pleurisy, hoarseness, laryngitis, wheezing.  Gastrointestinal: Denies dysphagia, odynophagia, heartburn, reflux, water brash, abdominal pain or cramps, nausea, vomiting, bloating, diarrhea, constipation, hematemesis, melena, hematochezia,  or hemorrhoids. Genitourinary: Denies dysuria, frequency, urgency, nocturia, hesitancy, discharge, hematuria, flank pain. Musculoskeletal: Denies arthralgias, myalgias, stiffness, jt. swelling, pain, limp, strain/sprain.  Skin: Denies pruritus, rash, hives, warts, acne, eczema, change in skin lesion(s). Neuro: No weakness, tremor, incoordination, spasms, paresthesia, or pain. Psychiatric: Denies  confusion, memory loss, or sensory loss. Endo: Denies change in weight, skin, hair change.  Heme/Lymph: No excessive bleeding, bruising, orenlarged lymph nodes.   Exam:                        BP 148/88  Pulse 60  Temp 99.1 F   Resp 16  Ht 6' 0.5"   Wt 203 lb 3.2 oz   BMI 27.17 kg/m2  Appears well nourished - in no  distress. Eyes: PERRLA, EOMs, conjunctiva no swelling or erythema. Sinuses: No frontal/maxillary tenderness ENT/Mouth: EAC's clear, TM's nl w/o erythema, bulging. Nares clear w/o erythema, swelling, exudates. Oropharynx clear without erythema or exudates. Oral hygiene is good. Tongue normal, non obstructing. Hearing intact.  Neck: Supple. Thyroid nl. Car 2+/2+ without bruits, nodes or JVD. Chest: Respirations nl with BS clear & equal w/o rales, rhonchi, wheezing or stridor.  Cor: Heart sounds normal w/ regular rate and rhythm without sig. murmurs, gallops, clicks, or rubs. Peripheral pulses normal and equal  without edema.  Abdomen: Soft & bowel sounds normal. Non-tender w/o guarding, rebound, hernias, masses, or organomegaly.  Lymphatics: Unremarkable.  Musculoskeletal: Full ROM all peripheral extremities, joint stability, 5/5 strength, and normal gait.  Skin: Warm, dry without exposed rashes, lesions, ecchymosis apparent.  Neuro: Cranial nerves intact, reflexes equal bilaterally. Sensory-motor testing grossly intact. Tendon reflexes grossly intact.  Pysch: Alert & oriented x 3. Insight and judgement nl & appropriate. No ideations.  Assessment and Plan:  1. Hypertension - Continue monitor blood pressure at home. Continue diet/meds same.  2. Hyperlipidemia - Continue diet/meds, exercise,& lifestyle modifications. Continue monitor periodic cholesterol/liver & renal functions   3. Pre-diabetes - Continue diet, exercise, lifestyle modifications. Monitor appropriate labs.  4. Vitamin D Deficiency - Continue supplementation.  Recommended regular exercise, BP monitoring, weight control, and discussed med and SE's. Recommended labs to assess and monitor clinical status. Further disposition pending results of labs.

## 2013-08-05 NOTE — Patient Instructions (Signed)

## 2013-08-06 LAB — BASIC METABOLIC PANEL WITH GFR
BUN: 20 mg/dL (ref 6–23)
CHLORIDE: 101 meq/L (ref 96–112)
CO2: 28 mEq/L (ref 19–32)
Calcium: 9.6 mg/dL (ref 8.4–10.5)
Creat: 1.6 mg/dL — ABNORMAL HIGH (ref 0.50–1.35)
GFR, Est African American: 51 mL/min — ABNORMAL LOW
GFR, Est Non African American: 45 mL/min — ABNORMAL LOW
Glucose, Bld: 124 mg/dL — ABNORMAL HIGH (ref 70–99)
POTASSIUM: 4.3 meq/L (ref 3.5–5.3)
Sodium: 142 mEq/L (ref 135–145)

## 2013-08-06 LAB — LIPID PANEL
CHOL/HDL RATIO: 2.7 ratio
Cholesterol: 136 mg/dL (ref 0–200)
HDL: 50 mg/dL (ref >39)
LDL CALC: 57 mg/dL (ref 0–99)
TRIGLYCERIDES: 147 mg/dL (ref <150)
VLDL: 29 mg/dL (ref 0–40)

## 2013-08-06 LAB — HEPATIC FUNCTION PANEL
ALK PHOS: 55 U/L (ref 39–117)
ALT: 16 U/L (ref 0–53)
AST: 21 U/L (ref 0–37)
Albumin: 4.4 g/dL (ref 3.5–5.2)
BILIRUBIN INDIRECT: 0.5 mg/dL (ref 0.2–1.2)
BILIRUBIN TOTAL: 0.6 mg/dL (ref 0.2–1.2)
Bilirubin, Direct: 0.1 mg/dL (ref 0.0–0.3)
Total Protein: 6.8 g/dL (ref 6.0–8.3)

## 2013-08-06 LAB — INSULIN, FASTING: Insulin fasting, serum: 15 u[IU]/mL (ref 3–28)

## 2013-08-06 LAB — VITAMIN D 25 HYDROXY (VIT D DEFICIENCY, FRACTURES): Vit D, 25-Hydroxy: 95 ng/mL — ABNORMAL HIGH (ref 30–89)

## 2013-08-06 LAB — TSH: TSH: 1.519 u[IU]/mL (ref 0.350–4.500)

## 2013-08-06 LAB — HEMOGLOBIN A1C
Hgb A1c MFr Bld: 6.4 % — ABNORMAL HIGH (ref <5.7)
Mean Plasma Glucose: 137 mg/dL — ABNORMAL HIGH (ref <117)

## 2013-08-06 LAB — MAGNESIUM: Magnesium: 2.2 mg/dL (ref 1.5–2.5)

## 2013-08-07 ENCOUNTER — Other Ambulatory Visit: Payer: Self-pay | Admitting: Internal Medicine

## 2013-08-07 ENCOUNTER — Other Ambulatory Visit: Payer: Self-pay | Admitting: *Deleted

## 2013-08-07 ENCOUNTER — Telehealth: Payer: Self-pay | Admitting: Internal Medicine

## 2013-08-07 DIAGNOSIS — M159 Polyosteoarthritis, unspecified: Secondary | ICD-10-CM

## 2013-08-07 MED ORDER — MELOXICAM 15 MG PO TABS
ORAL_TABLET | ORAL | Status: AC
Start: 2013-08-07 — End: ?

## 2013-08-07 MED ORDER — MELOXICAM 15 MG PO TABS: ORAL_TABLET | ORAL | Status: DC

## 2013-08-07 NOTE — Telephone Encounter (Signed)
PT CALLED AND SAID THE MOBIC (g) SHOULD HAVE BEEN SENT TO [facility], FAX - [phone]  [place]- [phone] IT WAS SENT TO [facility]...  THANK YOU  KAT

## 2013-08-09 NOTE — Telephone Encounter (Signed)
Patient called regarding back pain.  Per Dr Melford Aase, patient can call his orthopedist,Dr Lorin Mercy, and set up his appointment.  Reviewed labs with patient.

## 2013-10-08 ENCOUNTER — Ambulatory Visit: Payer: Self-pay | Admitting: Physician Assistant

## 2013-11-08 ENCOUNTER — Ambulatory Visit (INDEPENDENT_AMBULATORY_CARE_PROVIDER_SITE_OTHER): Payer: Medicare HMO | Admitting: Physician Assistant

## 2013-11-08 VITALS — BP 120/78 | HR 60 | Temp 97.9°F | Resp 16 | Wt 216.0 lb

## 2013-11-08 DIAGNOSIS — I10 Essential (primary) hypertension: Secondary | ICD-10-CM

## 2013-11-08 DIAGNOSIS — F329 Major depressive disorder, single episode, unspecified: Secondary | ICD-10-CM

## 2013-11-08 DIAGNOSIS — E559 Vitamin D deficiency, unspecified: Secondary | ICD-10-CM

## 2013-11-08 DIAGNOSIS — F32A Depression, unspecified: Secondary | ICD-10-CM

## 2013-11-08 DIAGNOSIS — R7309 Other abnormal glucose: Secondary | ICD-10-CM

## 2013-11-08 DIAGNOSIS — K219 Gastro-esophageal reflux disease without esophagitis: Secondary | ICD-10-CM

## 2013-11-08 DIAGNOSIS — F3289 Other specified depressive episodes: Secondary | ICD-10-CM

## 2013-11-08 DIAGNOSIS — R7303 Prediabetes: Secondary | ICD-10-CM

## 2013-11-08 DIAGNOSIS — Z79899 Other long term (current) drug therapy: Secondary | ICD-10-CM

## 2013-11-08 DIAGNOSIS — E782 Mixed hyperlipidemia: Secondary | ICD-10-CM

## 2013-11-08 LAB — LIPID PANEL
CHOLESTEROL: 130 mg/dL (ref 0–200)
HDL: 56 mg/dL (ref >39)
LDL CALC: 45 mg/dL (ref 0–99)
Total CHOL/HDL Ratio: 2.3 Ratio
Triglycerides: 147 mg/dL (ref <150)
VLDL: 29 mg/dL (ref 0–40)

## 2013-11-08 LAB — BASIC METABOLIC PANEL WITH GFR
BUN: 19 mg/dL (ref 6–23)
CALCIUM: 9.5 mg/dL (ref 8.4–10.5)
CO2: 29 mEq/L (ref 19–32)
Chloride: 99 mEq/L (ref 96–112)
Creat: 1.61 mg/dL — ABNORMAL HIGH (ref 0.50–1.35)
GFR, Est African American: 51 mL/min — ABNORMAL LOW
GFR, Est Non African American: 44 mL/min — ABNORMAL LOW
Glucose, Bld: 113 mg/dL — ABNORMAL HIGH (ref 70–99)
Potassium: 4.4 mEq/L (ref 3.5–5.3)
SODIUM: 140 meq/L (ref 135–145)

## 2013-11-08 LAB — CBC WITH DIFFERENTIAL/PLATELET
Basophils Absolute: 0 10*3/uL (ref 0.0–0.1)
Basophils Relative: 0 % (ref 0–1)
EOS ABS: 0.2 10*3/uL (ref 0.0–0.7)
Eosinophils Relative: 3 % (ref 0–5)
HCT: 42 % (ref 39.0–52.0)
HEMOGLOBIN: 14.8 g/dL (ref 13.0–17.0)
Lymphocytes Relative: 27 % (ref 12–46)
Lymphs Abs: 1.9 10*3/uL (ref 0.7–4.0)
MCH: 31.8 pg (ref 26.0–34.0)
MCHC: 35.2 g/dL (ref 30.0–36.0)
MCV: 90.3 fL (ref 78.0–100.0)
MONOS PCT: 9 % (ref 3–12)
Monocytes Absolute: 0.6 10*3/uL (ref 0.1–1.0)
NEUTROS PCT: 61 % (ref 43–77)
Neutro Abs: 4.3 10*3/uL (ref 1.7–7.7)
Platelets: 146 10*3/uL — ABNORMAL LOW (ref 150–400)
RBC: 4.65 MIL/uL (ref 4.22–5.81)
RDW: 13.9 % (ref 11.5–15.5)
WBC: 7.1 10*3/uL (ref 4.0–10.5)

## 2013-11-08 LAB — HEMOGLOBIN A1C
Hgb A1c MFr Bld: 6.1 % — ABNORMAL HIGH (ref <5.7)
Mean Plasma Glucose: 128 mg/dL — ABNORMAL HIGH (ref <117)

## 2013-11-08 LAB — HEPATIC FUNCTION PANEL
ALT: 18 U/L (ref 0–53)
AST: 22 U/L (ref 0–37)
Albumin: 4.5 g/dL (ref 3.5–5.2)
Alkaline Phosphatase: 52 U/L (ref 39–117)
BILIRUBIN INDIRECT: 0.5 mg/dL (ref 0.2–1.2)
Bilirubin, Direct: 0.1 mg/dL (ref 0.0–0.3)
Total Bilirubin: 0.6 mg/dL (ref 0.2–1.2)
Total Protein: 6.7 g/dL (ref 6.0–8.3)

## 2013-11-08 LAB — MAGNESIUM: MAGNESIUM: 1.9 mg/dL (ref 1.5–2.5)

## 2013-11-08 LAB — TSH: TSH: 1.051 u[IU]/mL (ref 0.350–4.500)

## 2013-11-08 NOTE — Patient Instructions (Signed)
We are starting you on Metformin to prevent or treat diabetes. Metformin does not cause low blood sugars. In order to create energy your cells need insulin and sugar but sometime your cells do not accept the insulin and this can cause increased sugars and decreased energy. The Metformin helps your cells accept insulin and the sugar to give you more energy.   The two most common side effects are nausea and diarrhea, follow these rules to avoid it! You can take imodium per box instructions when starting metformin if needed.   Rules of metformin: 1) start out slow with only one pill daily. Our goal for you is 4 pills a day or 2000mg  total.  2) take with your largest meal. 3) Take with least amount of carbs.   Call if you have any problems.   [facility]- ask if they have any potential job contacts [place]- [phone] [employer]- [place] also include fruit juice, alcohol, and sweet tea. These are empty calories that do not signal to your brain that you are full.   Please remember the good carbs are still carbs which convert into sugar. So please measure them out no more than 1/2-1 cup of rice, oatmeal, pasta, and beans.  Veggies are however free foods! Pile them on.   I like lean protein at every meal such as chicken, [place], pork chops, cottage cheese, etc. Just do not fry these meats and please center your meal around vegetable, the meats should be a side dish.   No all fruit is created equal. Please see the list below, the fruit at the bottom is higher in sugars than the fruit at the top

## 2013-11-08 NOTE — Progress Notes (Signed)
Assessment and Plan:  Hypertension: Continue medication, monitor blood pressure at home. Continue DASH diet. Cholesterol: Continue diet and exercise. Check cholesterol.  Pre-diabetes-Continue diet and exercise. Check A1C. Long discussion diet and weight loss, will start on MF pending kidney function.  Vitamin D Def- check level and continue medications.   Continue diet and meds as discussed. Further disposition pending results of labs.  HPI 63 y.o. male  presents for 3 month follow up with hypertension, hyperlipidemia, prediabetes and vitamin D. His blood pressure has been controlled at home, today their BP is BP: 120/78 mmHg He does not workout. He denies chest pain, shortness of breath, dizziness.  He is on cholesterol medication and denies myalgias. His cholesterol is at goal. The cholesterol last visit was:   Lab Results  Component Value Date   CHOL 136 08/05/2013   HDL 50 08/05/2013   LDLCALC 57 08/05/2013   TRIG 147 08/05/2013   CHOLHDL 2.7 08/05/2013   He has been working on diet and exercise for prediabetes, and denies paresthesia of the feet, polydipsia and polyuria. Last A1C in the office was:  Lab Results  Component Value Date   HGBA1C 6.4* 08/05/2013   Patient is on Vitamin D supplement.   He has been seeing Dr. Jasmine December, had TURP in November and has been doing better. He is now able to empty his bladder. He states that he is a little depressed right now, he does not have a job right now and has financial stress.   Current Medications:  Current Outpatient Prescriptions on File Prior to Visit  Medication Sig Dispense Refill  . atenolol (TENORMIN) 100 MG tablet Take 1 tablet (100 mg total) by mouth daily.  90 tablet  1  . atorvastatin (LIPITOR) 80 MG tablet Take 1 tablet (80 mg total) by mouth 3 (three) times a week.  90 tablet  0  . B Complex-C (SUPER B COMPLEX PO) Take 1 tablet by mouth every morning.      . cyclobenzaprine (FLEXERIL) 10 MG tablet Take 10 mg by mouth 3  (three) times daily as needed for muscle spasms.      . finasteride (PROSCAR) 5 MG tablet Take 5 mg by mouth daily.       . meloxicam (MOBIC) 15 MG tablet 1/2 to 1 tablet daily with food for arthritis pain and inflammation  90 tablet  99  . Multiple Vitamin (MULTIVITAMIN WITH MINERALS) TABS tablet Take 1 tablet by mouth every morning.       . tamsulosin (FLOMAX) 0.4 MG CAPS capsule Take 0.4 mg by mouth 2 (two) times daily.       No current facility-administered medications on file prior to visit.   Medical History:  Past Medical History  Diagnosis Date  . Hyperlipidemia   . HTN (hypertension)   . Allergic rhinitis   . Headache(784.0)   . Nocturia   . Urinary retention   . Foley catheter in place   . UTI (urinary tract infection)   . GERD (gastroesophageal reflux disease)   . Prediabetes   . Depression   . Arthritis    Allergies: No Known Allergies   Review of Systems: [X]  = complains of  [ ]  = denies  General: Fatigue [ ]  Fever [ ]  Chills [ ]  Weakness [ ]   Insomnia [ ]  Eyes: Redness [ ]  Blurred vision [ ]  Diplopia [ ]   ENT: Congestion [ ]  Sinus Pain [ ]  Post Nasal Drip [ ]  Sore Throat [ ]  Earache [ ]   Cardiac: Chest pain/pressure [ ]  SOB [ ]  Orthopnea [ ]   Palpitations [ ]   Paroxysmal nocturnal dyspnea[ ]  Claudication [ ]  Edema [ ]   Pulmonary: Cough [ ]  Wheezing[ ]   SOB [ ]   Snoring [ ]   GI: Nausea [ ]  Vomiting[ ]  Dysphagia[ ]  Heartburn[ ]  Abdominal pain [ ]  Constipation [ ] ; Diarrhea [ ] ; BRBPR [ ]  Melena[ ]  GU: Hematuria[ ]  Dysuria [ ]  Nocturia[ ]  Urgency [ ]   Hesitancy [ ]  Discharge [ ]  Neuro: Headaches[ ]  Vertigo[ ]  Paresthesias[ ]  Spasm [ ]  Speech changes [ ]  Incoordination [ ]   Ortho: Arthritis [ ]  Joint pain [ ]  Muscle pain [ ]  Joint swelling [ ]  Back Pain [ ]  Skin:  Rash [ ]   Pruritis [ ]  Change in skin lesion [ ]   Psych: Depression[ ]  Anxiety[ ]  Confusion [ ]  Memory loss [ ]   Heme/Lypmh: Bleeding [ ]  Bruising [ ]  Enlarged lymph nodes [ ]   Endocrine: Visual blurring [  ] Paresthesia [ ]  Polyuria [ ]  Polydypsea [ ]    Heat/cold intolerance [ ]  Hypoglycemia [ ]   Family history- Review and unchanged Social history- Review and unchanged Physical Exam: BP 120/78  Pulse 60  Temp(Src) 97.9 F (36.6 C)  Resp 16  Wt 216 lb (97.977 kg) Wt Readings from Last 3 Encounters:  11/08/13 216 lb (97.977 kg)  08/05/13 203 lb 3.2 oz (92.171 kg)  05/06/13 200 lb (90.719 kg)   General Appearance: Well nourished, in no apparent distress. Eyes: PERRLA, EOMs, conjunctiva no swelling or erythema Sinuses: No Frontal/maxillary tenderness ENT/Mouth: Ext aud canals clear, TMs without erythema, bulging. No erythema, swelling, or exudate on post pharynx.  Tonsils not swollen or erythematous. Hearing normal.  Neck: Supple, thyroid normal.  Respiratory: Respiratory effort normal, BS equal bilaterally without rales, rhonchi, wheezing or stridor.  Cardio: RRR with no MRGs. Brisk peripheral pulses without edema.  Abdomen: Soft, + BS.  Non tender, no guarding, rebound, hernias, masses. Lymphatics: Non tender without lymphadenopathy.  Musculoskeletal: Full ROM, 5/5 strength, normal gait.  Skin: Warm, dry without rashes, lesions, ecchymosis.  Neuro: Cranial nerves intact. Normal muscle tone, no cerebellar symptoms. Sensation intact.  Psych: Awake and oriented X 3, normal affect, Insight and Judgment appropriate.    [name] 11:33 AM

## 2013-11-09 LAB — INSULIN, FASTING: Insulin fasting, serum: 24 u[IU]/mL (ref 3–28)

## 2013-11-11 ENCOUNTER — Telehealth: Payer: Self-pay

## 2013-11-11 NOTE — Telephone Encounter (Signed)
Patient returned call, gave him lab results and instructions

## 2014-02-10 ENCOUNTER — Encounter: Payer: Self-pay | Admitting: Internal Medicine

## 2014-02-10 ENCOUNTER — Ambulatory Visit (INDEPENDENT_AMBULATORY_CARE_PROVIDER_SITE_OTHER): Payer: No Typology Code available for payment source | Admitting: Internal Medicine

## 2014-02-10 VITALS — BP 114/80 | HR 64 | Temp 98.1°F | Resp 16 | Ht 72.75 in | Wt 210.0 lb

## 2014-02-10 DIAGNOSIS — Z1331 Encounter for screening for depression: Secondary | ICD-10-CM

## 2014-02-10 DIAGNOSIS — Z0000 Encounter for general adult medical examination without abnormal findings: Secondary | ICD-10-CM

## 2014-02-10 DIAGNOSIS — R109 Unspecified abdominal pain: Secondary | ICD-10-CM | POA: Diagnosis not present

## 2014-02-10 DIAGNOSIS — R7401 Elevation of levels of liver transaminase levels: Secondary | ICD-10-CM

## 2014-02-10 DIAGNOSIS — R7402 Elevation of levels of lactic acid dehydrogenase (LDH): Secondary | ICD-10-CM

## 2014-02-10 DIAGNOSIS — Z113 Encounter for screening for infections with a predominantly sexual mode of transmission: Secondary | ICD-10-CM

## 2014-02-10 DIAGNOSIS — Z79899 Other long term (current) drug therapy: Secondary | ICD-10-CM

## 2014-02-10 DIAGNOSIS — I10 Essential (primary) hypertension: Secondary | ICD-10-CM | POA: Diagnosis not present

## 2014-02-10 DIAGNOSIS — N320 Bladder-neck obstruction: Secondary | ICD-10-CM

## 2014-02-10 DIAGNOSIS — N183 Chronic kidney disease, stage 3 unspecified: Secondary | ICD-10-CM | POA: Insufficient documentation

## 2014-02-10 DIAGNOSIS — E559 Vitamin D deficiency, unspecified: Secondary | ICD-10-CM

## 2014-02-10 DIAGNOSIS — Z1212 Encounter for screening for malignant neoplasm of rectum: Secondary | ICD-10-CM

## 2014-02-10 DIAGNOSIS — Z789 Other specified health status: Secondary | ICD-10-CM

## 2014-02-10 DIAGNOSIS — R740 Nonspecific elevation of levels of transaminase and lactic acid dehydrogenase [LDH]: Secondary | ICD-10-CM

## 2014-02-10 DIAGNOSIS — E782 Mixed hyperlipidemia: Secondary | ICD-10-CM

## 2014-02-10 DIAGNOSIS — I150 Renovascular hypertension: Secondary | ICD-10-CM | POA: Diagnosis not present

## 2014-02-10 DIAGNOSIS — Z23 Encounter for immunization: Secondary | ICD-10-CM | POA: Diagnosis not present

## 2014-02-10 DIAGNOSIS — Z125 Encounter for screening for malignant neoplasm of prostate: Secondary | ICD-10-CM

## 2014-02-10 LAB — CBC WITH DIFFERENTIAL/PLATELET
Basophils Absolute: 0 10*3/uL (ref 0.0–0.1)
Basophils Relative: 0 % (ref 0–1)
EOS PCT: 2 % (ref 0–5)
Eosinophils Absolute: 0.2 10*3/uL (ref 0.0–0.7)
HCT: 42.6 % (ref 39.0–52.0)
Hemoglobin: 15 g/dL (ref 13.0–17.0)
LYMPHS ABS: 1.4 10*3/uL (ref 0.7–4.0)
LYMPHS PCT: 16 % (ref 12–46)
MCH: 32.1 pg (ref 26.0–34.0)
MCHC: 35.2 g/dL (ref 30.0–36.0)
MCV: 91 fL (ref 78.0–100.0)
MONO ABS: 0.4 10*3/uL (ref 0.1–1.0)
Monocytes Relative: 5 % (ref 3–12)
Neutro Abs: 6.9 10*3/uL (ref 1.7–7.7)
Neutrophils Relative %: 77 % (ref 43–77)
PLATELETS: 156 10*3/uL (ref 150–400)
RBC: 4.68 MIL/uL (ref 4.22–5.81)
RDW: 13.8 % (ref 11.5–15.5)
WBC: 8.9 10*3/uL (ref 4.0–10.5)

## 2014-02-10 NOTE — Patient Instructions (Signed)
Recommend the book "The END of DIETING" by Dr Baker Janus   and the book "The END of DIABETES " by Dr Excell Seltzer  At [email or web address] - get book & Audio CD's      Being diabetic has a  300% increased risk for heart attack, stroke, cancer, and alzheimer- type vascular dementia. It is very important that you work harder with diet by avoiding all foods that are white except chicken & fish. Avoid white rice (brown & wild rice is OK), white potatoes (sweetpotatoes in moderation is OK), White bread or wheat bread or anything made out of white flour like bagels, donuts, rolls, buns, biscuits, cakes, pastries, cookies, pizza crust, and pasta (made from white flour & egg whites) - vegetarian pasta or spinach or wheat pasta is OK. Multigrain breads like Arnold's or [employer], or multigrain sandwich thins or flatbreads.  Diet, exercise and weight loss can reverse and cure diabetes in the early stages.  Diet, exercise and weight loss is very important in the control and prevention of complications of diabetes which affects every system in your body, ie. Brain - dementia/stroke, eyes - glaucoma/blindness, heart - heart attack/heart failure, kidneys - dialysis, stomach - gastric paralysis, intestines - malabsorption, nerves - severe painful neuritis, circulation - gangrene & loss of a leg(s), and finally cancer and Alzheimers.    I recommend avoid fried & greasy foods,  sweets/candy, white rice (brown or wild rice or Quinoa is OK), white potatoes (sweet potatoes are OK) - anything made from white flour - bagels, doughnuts, rolls, buns, biscuits,white and wheat breads, pizza crust and traditional pasta made of white flour & egg white(vegetarian pasta or spinach or wheat pasta is OK).  Multi-grain bread is OK - like multi-grain flat bread or sandwich thins. Avoid alcohol in excess. Exercise is also important.    Eat all the vegetables you want - avoid meat, especially red meat and dairy - especially cheese.  Cheese  is the most concentrated form of trans-fats which is the worst thing to clog up our arteries. Veggie cheese is OK which can be found in the fresh produce section at [employer] or [employer] or [employer]  Preventive Care for Adults A healthy lifestyle and preventive care can promote health and wellness. Preventive health guidelines for men include the following key practices:  A routine yearly physical is a good way to check with your health care provider about your health and preventative screening. It is a chance to share any concerns and updates on your health and to receive a thorough exam.  Visit your dentist for a routine exam and preventative care every 6 months. Brush your teeth twice a day and floss once a day. Good oral hygiene prevents tooth decay and gum disease.  The frequency of eye exams is based on your age, health, family medical history, use of contact lenses, and other factors. Follow your health care provider's recommendations for frequency of eye exams.  Eat a healthy diet. Foods such as vegetables, fruits, whole grains, low-fat dairy products, and lean protein foods contain the nutrients you need without too many calories. Decrease your intake of foods high in solid fats, added sugars, and salt. Eat the right amount of calories for you.Get information about a proper diet from your health care provider, if necessary.  Regular physical exercise is one of the most important things you can do for your health. Most adults should get at least 150 minutes of moderate-intensity exercise (any activity that  increases your heart rate and causes you to sweat) each week. In addition, most adults need muscle-strengthening exercises on 2 or more days a week.  Maintain a healthy weight. The body mass index (BMI) is a screening tool to identify possible weight problems. It provides an estimate of body fat based on height and weight. Your health care provider can find your BMI and can help you  achieve or maintain a healthy weight.For adults 20 years and older:  A BMI below 18.5 is considered underweight.  A BMI of 18.5 to 24.9 is normal.  A BMI of 25 to 29.9 is considered overweight.  A BMI of 30 and above is considered obese.  Maintain normal blood lipids and cholesterol levels by exercising and minimizing your intake of saturated fat. Eat a balanced diet with plenty of fruit and vegetables. Blood tests for lipids and cholesterol should begin at age 20 and be repeated every 5 years. If your lipid or cholesterol levels are high, you are over 50, or you are at high risk for heart disease, you may need your cholesterol levels checked more frequently.Ongoing high lipid and cholesterol levels should be treated with medicines if diet and exercise are not working.  If you smoke, find out from your health care provider how to quit. If you do not use tobacco, do not start.  Lung cancer screening is recommended for adults aged 72-80 years who are at high risk for developing lung cancer because of a history of smoking. A yearly low-dose CT scan of the lungs is recommended for people who have at least a 30-pack-year history of smoking and are a current smoker or have quit within the past 15 years. A pack year of smoking is smoking an average of 1 pack of cigarettes a day for 1 year (for example: 1 pack a day for 30 years or 2 packs a day for 15 years). Yearly screening should continue until the smoker has stopped smoking for at least 15 years. Yearly screening should be stopped for people who develop a health problem that would prevent them from having lung cancer treatment.  If you choose to drink alcohol, do not have more than 2 drinks per day. One drink is considered to be 12 ounces (355 mL) of beer, 5 ounces (148 mL) of wine, or 1.5 ounces (44 mL) of liquor.  Avoid use of street drugs. Do not share needles with anyone. Ask for help if you need support or instructions about stopping the use of  drugs.  High blood pressure causes heart disease and increases the risk of stroke. Your blood pressure should be checked at least every 1-2 years. Ongoing high blood pressure should be treated with medicines, if weight loss and exercise are not effective.  If you are 28-64 years old, ask your health care provider if you should take aspirin to prevent heart disease.  Diabetes screening involves taking a blood sample to check your fasting blood sugar level. This should be done once every 3 years, after age 13, if you are within normal weight and without risk factors for diabetes. Testing should be considered at a younger age or be carried out more frequently if you are overweight and have at least 1 risk factor for diabetes.  Colorectal cancer can be detected and often prevented. Most routine colorectal cancer screening begins at the age of 78 and continues through age 56. However, your health care provider may recommend screening at an earlier age if you have risk  factors for colon cancer. On a yearly basis, your health care provider may provide home test kits to check for hidden blood in the stool. Use of a small camera at the end of a tube to directly examine the colon (sigmoidoscopy or colonoscopy) can detect the earliest forms of colorectal cancer. Talk to your health care provider about this at age 48, when routine screening begins. Direct exam of the colon should be repeated every 5-10 years through age 60, unless early forms of precancerous polyps or small growths are found.  People who are at an increased risk for hepatitis B should be screened for this virus. You are considered at high risk for hepatitis B if:  You were born in a country where hepatitis B occurs often. Talk with your health care provider about which countries are considered high risk.  Your parents were born in a high-risk country and you have not received a shot to protect against hepatitis B (hepatitis B vaccine).  You have  HIV or AIDS.  You use needles to inject street drugs.  You live with, or have sex with, someone who has hepatitis B.  You are a man who has sex with other men (MSM).  You get hemodialysis treatment.  You take certain medicines for conditions such as cancer, organ transplantation, and autoimmune conditions.  Hepatitis C blood testing is recommended for all people born from [birth year] through 1965 and any individual with known risks for hepatitis C.  Practice safe sex. Use condoms and avoid high-risk sexual practices to reduce the spread of sexually transmitted infections (STIs). STIs include gonorrhea, chlamydia, syphilis, trichomonas, herpes, HPV, and human immunodeficiency virus (HIV). Herpes, HIV, and HPV are viral illnesses that have no cure. They can result in disability, cancer, and death.  If you are at risk of being infected with HIV, it is recommended that you take a prescription medicine daily to prevent HIV infection. This is called preexposure prophylaxis (PrEP). You are considered at risk if:  You are a man who has sex with other men (MSM) and have other risk factors.  You are a heterosexual man, are sexually active, and are at increased risk for HIV infection.  You take drugs by injection.  You are sexually active with a partner who has HIV.  Talk with your health care provider about whether you are at high risk of being infected with HIV. If you choose to begin PrEP, you should first be tested for HIV. You should then be tested every 3 months for as long as you are taking PrEP.  A one-time screening for abdominal aortic aneurysm (AAA) and surgical repair of large AAAs by ultrasound are recommended for men ages 51 to 11 years who are current or former smokers.  Healthy men should no longer receive prostate-specific antigen (PSA) blood tests as part of routine cancer screening. Talk with your health care provider about prostate cancer screening.  Testicular cancer screening is  not recommended for adult males who have no symptoms. Screening includes self-exam, a health care provider exam, and other screening tests. Consult with your health care provider about any symptoms you have or any concerns you have about testicular cancer.  Use sunscreen. Apply sunscreen liberally and repeatedly throughout the day. You should seek shade when your shadow is shorter than you. Protect yourself by wearing long sleeves, pants, a wide-brimmed hat, and sunglasses year round, whenever you are outdoors.  Once a month, do a whole-body skin exam, using a mirror to look  at the skin on your back. Tell your health care provider about new moles, moles that have irregular borders, moles that are larger than a pencil eraser, or moles that have changed in shape or color.  Stay current with required vaccines (immunizations).  Influenza vaccine. All adults should be immunized every year.  Tetanus, diphtheria, and acellular pertussis (Td, Tdap) vaccine. An adult who has not previously received Tdap or who does not know his vaccine status should receive 1 dose of Tdap. This initial dose should be followed by tetanus and diphtheria toxoids (Td) booster doses every 10 years. Adults with an unknown or incomplete history of completing a 3-dose immunization series with Td-containing vaccines should begin or complete a primary immunization series including a Tdap dose. Adults should receive a Td booster every 10 years.  Varicella vaccine. An adult without evidence of immunity to varicella should receive 2 doses or a second dose if he has previously received 1 dose.  Human papillomavirus (HPV) vaccine. Males aged 29-21 years who have not received the vaccine previously should receive the 3-dose series. Males aged 22-26 years may be immunized. Immunization is recommended through the age of 26 years for any male who has sex with males and did not get any or all doses earlier. Immunization is recommended for any  person with an immunocompromised condition through the age of 29 years if he did not get any or all doses earlier. During the 3-dose series, the second dose should be obtained 4-8 weeks after the first dose. The third dose should be obtained 24 weeks after the first dose and 16 weeks after the second dose.  Zoster vaccine. One dose is recommended for adults aged 38 years or older unless certain conditions are present.  Measles, mumps, and rubella (MMR) vaccine. Adults born before [birth year] generally are considered immune to measles and mumps. Adults born in [birth year] or later should have 1 or more doses of MMR vaccine unless there is a contraindication to the vaccine or there is laboratory evidence of immunity to each of the three diseases. A routine second dose of MMR vaccine should be obtained at least 28 days after the first dose for students attending postsecondary schools, health care workers, or international travelers. People who received inactivated measles vaccine or an unknown type of measles vaccine during 1963-1967 should receive 2 doses of MMR vaccine. People who received inactivated mumps vaccine or an unknown type of mumps vaccine before 1979 and are at high risk for mumps infection should consider immunization with 2 doses of MMR vaccine. Unvaccinated health care workers born before [birth year] who lack laboratory evidence of measles, mumps, or rubella immunity or laboratory confirmation of disease should consider measles and mumps immunization with 2 doses of MMR vaccine or rubella immunization with 1 dose of MMR vaccine.  Pneumococcal 13-valent conjugate (PCV13) vaccine. When indicated, a person who is uncertain of his immunization history and has no record of immunization should receive the PCV13 vaccine. An adult aged 68 years or older who has certain medical conditions and has not been previously immunized should receive 1 dose of PCV13 vaccine. This PCV13 should be followed with a dose of pneumococcal  polysaccharide (PPSV23) vaccine. The PPSV23 vaccine dose should be obtained at least 8 weeks after the dose of PCV13 vaccine. An adult aged 95 years or older who has certain medical conditions and previously received 1 or more doses of PPSV23 vaccine should receive 1 dose of PCV13. The PCV13 vaccine dose should be obtained 1  or more years after the last PPSV23 vaccine dose.  Pneumococcal polysaccharide (PPSV23) vaccine. When PCV13 is also indicated, PCV13 should be obtained first. All adults aged 59 years and older should be immunized. An adult younger than age 29 years who has certain medical conditions should be immunized. Any person who resides in a nursing home or long-term care facility should be immunized. An adult smoker should be immunized. People with an immunocompromised condition and certain other conditions should receive both PCV13 and PPSV23 vaccines. People with human immunodeficiency virus (HIV) infection should be immunized as soon as possible after diagnosis. Immunization during chemotherapy or radiation therapy should be avoided. Routine use of PPSV23 vaccine is not recommended for American Indians, [place] Natives, or people younger than 65 years unless there are medical conditions that require PPSV23 vaccine. When indicated, people who have unknown immunization and have no record of immunization should receive PPSV23 vaccine. One-time revaccination 5 years after the first dose of PPSV23 is recommended for people aged 19-64 years who have chronic kidney failure, nephrotic syndrome, asplenia, or immunocompromised conditions. People who received 1-2 doses of PPSV23 before age 20 years should receive another dose of PPSV23 vaccine at age 87 years or later if at least 5 years have passed since the previous dose. Doses of PPSV23 are not needed for people immunized with PPSV23 at or after age 23 years.  Meningococcal vaccine. Adults with asplenia or persistent complement component deficiencies  should receive 2 doses of quadrivalent meningococcal conjugate (MenACWY-D) vaccine. The doses should be obtained at least 2 months apart. Microbiologists working with certain meningococcal bacteria, [place] recruits, people at risk during an outbreak, and people who travel to or live in countries with a high rate of meningitis should be immunized. A first-year college student up through age 30 years who is living in a residence hall should receive a dose if he did not receive a dose on or after his 16th birthday. Adults who have certain high-risk conditions should receive one or more doses of vaccine.  Hepatitis A vaccine. Adults who wish to be protected from this disease, have certain high-risk conditions, work with hepatitis A-infected animals, work in hepatitis A research labs, or travel to or work in countries with a high rate of hepatitis A should be immunized. Adults who were previously unvaccinated and who anticipate close contact with an international adoptee during the first 60 days after arrival in the [place] from a country with a high rate of hepatitis A should be immunized.  Hepatitis B vaccine. Adults should be immunized if they wish to be protected from this disease, have certain high-risk conditions, may be exposed to blood or other infectious body fluids, are household contacts or sex partners of hepatitis B positive people, are clients or workers in certain care facilities, or travel to or work in countries with a high rate of hepatitis B.  Haemophilus influenzae type b (Hib) vaccine. A previously unvaccinated person with asplenia or sickle cell disease or having a scheduled splenectomy should receive 1 dose of Hib vaccine. Regardless of previous immunization, a recipient of a hematopoietic stem cell transplant should receive a 3-dose series 6-12 months after his successful transplant. Hib vaccine is not recommended for adults with HIV infection. Preventive Service / Frequency Ages  13 and over  Blood pressure check.** / Every 1 to 2 years.  Lipid and cholesterol check.**/ Every 5 years beginning at age 33.  Lung cancer screening. / Every year if you are aged 51-80 years  55-80 years and have a 30-pack-year history of smoking and currently smoke or have quit within the past 15 years. Yearly screening is stopped once you have quit smoking for at least 15 years or develop a health problem that would prevent you from having lung cancer treatment.  Fecal occult blood test (FOBT) of stool. / Every year beginning at age 50 and continuing until age 75. You may not have to do this test if you get a colonoscopy every 10 years.  Flexible sigmoidoscopy** or colonoscopy.** / Every 5 years for a flexible sigmoidoscopy or every 10 years for a colonoscopy beginning at age 50 and continuing until age 75.  Hepatitis C blood test.** / For all people born from 1945 through 1965 and any individual with known risks for hepatitis C.  Abdominal aortic aneurysm (AAA) screening for persons with hypertension or who are current or former smokers.  Skin self-exam. / Monthly.  Influenza vaccine. / Every year.  Tetanus, diphtheria, and acellular pertussis (Tdap/Td) vaccine.** / 1 dose of Td every 10 years.  Varicella vaccine.** / Consult your health care provider.  Zoster vaccine.** / 1 dose for adults aged 60 years or older.  Pneumococcal 13-valent conjugate (PCV13) vaccine.** / Consult your health care provider.  Pneumococcal polysaccharide (PPSV23) vaccine.** / 1 dose for all adults aged 65 years and older.  Meningococcal vaccine.** / Consult your health care provider.  Hepatitis A vaccine.** / Consult your health care provider.  Hepatitis B vaccine.** / Consult your health care provider.  Haemophilus influenzae type b (Hib) vaccine.** / Consult your health care provider.   

## 2014-02-10 NOTE — Progress Notes (Signed)
Patient ID: [name], male   DOB: Nov 07, 1950, 63 y.o.   MRN: [id number]  [facility] VISIT AND CPE  Assessment:   1. Routine general medical examination at a health care facility  2. Essential hypertension  - Microalbumin / creatinine urine ratio - EKG 12-Lead - [place], RETROPERITNL ABD,  LTD - TSH  3. Vitamin D Deficiency  - Hemoglobin A1c - Insulin, fasting - Vit D  25 hydroxy (rtn osteoporosis monitoring)  4. Special screening for malignant neoplasm of prostate      - Check PSA & DRE  5. Screening for malignant neoplasm of the rectum  - POC Hemoccult Bld/Stl (3-Cd Home Screen); Future  6. Encounter for long-term (current) use of other medications  - Urine Microscopic - CBC with Differential - BASIC METABOLIC PANEL WITH GFR - Hepatic function panel - Magnesium  7. Mixed hyperlipidemia  - Lipid panel  8. Unspecified vitamin D deficiency  - Hemoglobin A1c - Insulin, fasting - Vit D  25 hydroxy (rtn osteoporosis monitoring)  9. Bladder neck obstruction  10. Renovascular hypertension   11. Other specified conditions influencing health status(V49.89)        - Screening for Falls   12. Screening for depression   13. Need for prophylactic vaccination and inoculation against influenza  - Flu vaccine HIGH DOSE PF (Fluzone Tri High dose)  14. Need for prophylactic vaccination against Streptococcus pneumoniae (pneumococcus)  - Pneumococcal conjugate vaccine 13-valent IM  15. Nonspecific elevation of levels of transaminase or lactic acid dehydrogenase (LDH)  16. Screening examination for venereal disease  17. Abdominal pain, unspecified site  - Ambulatory referral to Gastroenterology  Plan:   During the course of the visit the patient was educated and counseled about appropriate screening and preventive services including:    Pneumococcal vaccine   Influenza vaccine  Td vaccine  Screening electrocardiogram  Bone densitometry  screening  Colorectal cancer screening  Diabetes screening  Glaucoma screening  Nutrition counseling        Advised dental clinic  Advanced directives: requested         Screening recommendations, referrals: Vaccinations: Tdap vaccine 09/21/2005 Influenza vaccine 02/10/2014 Pneumococcal vaccine 03/20/2006 & 02/10/2014\ Prevnar - due next year Shingles vaccine -01/18/2011 Hep B vaccine - 2012  Nutrition assessed and recommended  Colonoscopy 06/03/2008 - 5 yr f/u due Recommended yearly ophthalmology/optometry visit for glaucoma screening and checkup Recommended yearly dental visit for hygiene and checkup Advanced directives - undecided  Conditions/risks identified: BMI: Discussed weight loss, diet, and increase physical activity.  Increase physical activity: AHA recommends 150 minutes of physical activity a week.  Medications reviewed PreDiabetes is not at goal, ACE/ARB therapy: No, Reason not on Ace Inhibitor/ARB therapy:  not indicated Urinary Incontinence is not an issue: discussed non pharmacology and pharmacology options.  Fall risk: low- discussed PT, home fall assessment, medications.    Subjective:  [name] is a 63 y.o. male who presents for Medicare Annual Wellness Visit and complete physical.  Date of last medicare wellness visit is unknown.  He has had elevated blood pressure since 1998. His blood pressure has been controlled at home, today their BP is BP: 114/80 mmHg He does not workout. He denies chest pain, shortness of breath, dizziness.  He is on cholesterol medication and denies myalgias. His cholesterol is at goal. The cholesterol last visit was:   Lab Results  Component Value Date   CHOL 130 11/08/2013   HDL 56 11/08/2013   LDLCALC 45 11/08/2013  TRIG 147 11/08/2013   CHOLHDL 2.3 11/08/2013   He has had PreDiabetes for 5 years since 12/2008. He has not been working on diet and exercise for  prediabetes, and denies foot ulcerations, hyperglycemia,  hypoglycemia , paresthesia of the feet, polydipsia, polyuria and visual disturbances. Last A1C in the office was:  Lab Results  Component Value Date   HGBA1C 6.1* 11/08/2013   Patient is on Vitamin D supplement.  (Vit D 30 in 2008) Lab Results  Component Value Date   VD25OH 95* 08/05/2013     Names of Other Physician/Practitioners you currently use: 1. [facility] here for primary care 2. No eye doctor, last visit  8 yrs 3. No regular dentist, last visit 2014 advised upper extractions and dentures which patient states he can't afford  Patient Care Team: Unk Pinto, MD as PCP - General (Internal Medicine) Inda Castle, MD as Consulting Physician (Gastroenterology) Marybelle Killings, MD as Consulting Physician (Orthopedic Surgery) Festus Aloe, MD as Consulting Physician (Urology)  Medication Review: Medication Sig  . atenolol (TENORMIN) 100 MG tablet Take 1 tablet (100 mg total) by mouth daily.  Marland Kitchen atorvastatin (LIPITOR) 80 MG tablet Take 1 tablet (80 mg total) by mouth 3 (three) times a week.  . B Complex-C (SUPER B COMPLEX PO) Take 1 tablet by mouth every morning.  . Cholecalciferol (VITAMIN D PO) Take 2,000 Int'l Units by mouth daily.  . cyclobenzaprine (FLEXERIL) 10 MG tablet Take 10 mg by mouth 3 (three) times daily as needed for muscle spasms.  . finasteride (PROSCAR) 5 MG tablet Take 5 mg by mouth daily.   . meloxicam (MOBIC) 15 MG tablet 1/2 to 1 tablet daily with food for arthritis pain and inflammation  . Multiple Vitamin (MULTIVITAMIN WITH MINERALS) TABS tablet Take 1 tablet by mouth every morning.   Marland Kitchen omeprazole (PRILOSEC) 20 MG capsule Take 20 mg by mouth daily.  . tamsulosin (FLOMAX) 0.4 MG CAPS capsule Take 0.4 mg by mouth 2 (two) times daily.   Current Problems (verified) Patient Active Problem List   Diagnosis Date Noted  . CKD Stage III (GFR 44 ml/min) 02/10/2014  . Vitamin D Deficiency 08/05/2013  . Encounter for  long-term (current) use of other medications 08/05/2013  . GERD   . Prediabetes   . Depression   . Allergic rhinitis   . HTN 05/06/2013  . Hyperlipidemia 05/06/2013  . Hydronephrosis, bilateral 01/04/2013  . Acute on chronic renal insufficiency 01/04/2013  . BPH with urinary obstruction 01/04/2013  . IRON DEFICIENCY 05/26/2008   Screening Tests Health Maintenance  Topic Date Due  . Pneumococcal Polysaccharide Vaccine Age 97 And Over  07/27/2013  . Influenza Vaccine  12/14/2013  . Tetanus/tdap  09/22/2015  . Colonoscopy  05/16/2018  . Zostavax  01/18/2011    Immunization History  Administered Date(s) Administered  . DTaP 09/21/2005  . Influenza Whole 03/16/2010, 01/31/2013  . Influenza, High Dose Seasonal PF 02/10/2014  . Pneumococcal Conjugate-13 02/10/2014  . Pneumococcal Polysaccharide-23 03/20/2006  . Td 09/21/2005  . Zoster 01/18/2011   Preventative care: Last colonoscopy: 05/2008 Prior vaccinations: Above  History reviewed: allergies, current medications, past family history, past medical history, past social history, past surgical history and problem list  Risk Factors: Tobacco History  Substance Use Topics  . Smoking status: Passive Smoke Exposure - Never Smoker -- 34 years  . Smokeless tobacco: Former Systems developer    Types: [place] date: 01/03/1978  . Alcohol Use: No  He does not smoke.  Patient is a former smoker. Are there smokers in your home (other than you)?  No  Alcohol Current alcohol use: none  Caffeine Current caffeine use: coffee 1-2 /day  Exercise Current exercise: none  Nutrition/Diet Current diet: in general, a "healthy" diet    Cardiac risk factors: advanced age (older than 48 for men, 44 for women), dyslipidemia, hypertension, male gender and sedentary lifestyle.  Depression Screen (Note: if answer to either of the following is "Yes", a more complete depression screening is indicated)   Q1: Over the past two weeks, have you felt  down, depressed or hopeless? No  Q2: Over the past two weeks, have you felt little interest or pleasure in doing things? No  Have you lost interest or pleasure in daily life? No  Do you often feel hopeless? No  Do you cry easily over simple problems? No  Activities of Daily Living In your present state of health, do you have any difficulty performing the following activities?:  Driving? No Managing money?  No Feeding yourself? No Getting from bed to chair? No Climbing a flight of stairs? No Preparing food and eating?: No Bathing or showering? No Getting dressed: No Getting to the toilet? No Using the toilet:No Moving around from place to place: No In the past year have you fallen or had a near fall?:No   Are you sexually active?  No  Do you have more than one partner?  No  Vision Difficulties: No  Hearing Difficulties: No Do you often ask people to speak up or repeat themselves? No Do you experience ringing or noises in your ears? No Do you have difficulty understanding soft or whispered voices? No  Cognition  Do you feel that you have a problem with memory?No  Do you often misplace items? No  Do you feel safe at home?  Yes  Advanced directives Does patient have a [place]? No Does patient have a Living Will? No   Objective:     Blood pressure 114/80, pulse 64, temperature 98.1 F (36.7 C), temperature source Temporal, resp. rate 16, height 6' 0.75" (1.848 m), weight 210 lb (95.255 kg). Body mass index is 27.89 kg/(m^2).  General appearance: alert, no distress, WD/WN, male Cognitive Testing  Alert? Yes  Normal Appearance? Yes  Oriented to person? Yes  Place? Yes   Time? Yes  Recall of three objects?  Yes  Can perform simple calculations? Yes  Displays appropriate judgment? Yes  Can read the correct time from a watch/clock? Yes  HEENT: normocephalic, sclerae anicteric, TMs pearly, nares patent, no discharge or erythema, pharynx  normal Oral cavity: MMM, no lesions Neck: supple, no lymphadenopathy, no thyromegaly, no masses Heart: RRR, normal S1, S2, no murmurs Lungs: CTA bilaterally, no wheezes, rhonchi, or rales Abdomen: +bs, soft, non tender, non distended, no masses, no hepatomegaly, no splenomegaly DRE: Deferred to Dr Jasmine December net week Musculoskeletal: nontender, no swelling, no obvious deformity Extremities: no edema, no cyanosis, no clubbing Pulses: 2+ symmetric, upper and lower extremities, normal cap refill Neurological: alert, oriented x 3, CN2-12 intact, strength normal upper extremities and lower extremities, sensation normal throughout, DTRs 2+ throughout, no cerebellar signs, gait normal Psychiatric: normal affect, behavior normal, pleasant   Medicare Attestation I have personally reviewed: The patient's medical and social history Their use of alcohol, tobacco or illicit drugs Their current medications and supplements The patient's functional ability including ADLs,fall risks, home safety risks, cognitive, and hearing and visual impairment  Diet and physical activities Evidence for depression or mood disorders  The patient's weight, height, BMI, and visual acuity have been recorded in the chart.  I have made referrals, counseling, and provided education to the patient based on review of the above and I have provided the patient with a written personalized care plan for preventive services.    Auburn Hester DAVID, MD   02/10/2014

## 2014-02-11 LAB — HEPATIC FUNCTION PANEL
ALT: 12 U/L (ref 0–53)
AST: 20 U/L (ref 0–37)
Albumin: 4.2 g/dL (ref 3.5–5.2)
Alkaline Phosphatase: 53 U/L (ref 39–117)
BILIRUBIN DIRECT: 0.1 mg/dL (ref 0.0–0.3)
Indirect Bilirubin: 0.6 mg/dL (ref 0.2–1.2)
Total Bilirubin: 0.7 mg/dL (ref 0.2–1.2)
Total Protein: 6.5 g/dL (ref 6.0–8.3)

## 2014-02-11 LAB — TSH: TSH: 1.208 u[IU]/mL (ref 0.350–4.500)

## 2014-02-11 LAB — URINALYSIS, MICROSCOPIC ONLY
BACTERIA UA: NONE SEEN
Casts: NONE SEEN
Crystals: NONE SEEN
Squamous Epithelial / LPF: NONE SEEN

## 2014-02-11 LAB — LIPID PANEL
CHOL/HDL RATIO: 2.5 ratio
CHOLESTEROL: 106 mg/dL (ref 0–200)
HDL: 42 mg/dL (ref >39)
LDL Cholesterol: 33 mg/dL (ref 0–99)
Triglycerides: 153 mg/dL — ABNORMAL HIGH (ref <150)
VLDL: 31 mg/dL (ref 0–40)

## 2014-02-11 LAB — BASIC METABOLIC PANEL WITH GFR
BUN: 21 mg/dL (ref 6–23)
CALCIUM: 9.4 mg/dL (ref 8.4–10.5)
CHLORIDE: 105 meq/L (ref 96–112)
CO2: 27 meq/L (ref 19–32)
CREATININE: 1.64 mg/dL — AB (ref 0.50–1.35)
GFR, Est African American: 50 mL/min — ABNORMAL LOW
GFR, Est Non African American: 43 mL/min — ABNORMAL LOW
GLUCOSE: 123 mg/dL — AB (ref 70–99)
Potassium: 4.2 mEq/L (ref 3.5–5.3)
Sodium: 140 mEq/L (ref 135–145)

## 2014-02-11 LAB — MAGNESIUM: MAGNESIUM: 1.9 mg/dL (ref 1.5–2.5)

## 2014-02-11 LAB — HEMOGLOBIN A1C
HEMOGLOBIN A1C: 6.4 % — AB (ref <5.7)
Mean Plasma Glucose: 137 mg/dL — ABNORMAL HIGH (ref <117)

## 2014-02-11 LAB — MICROALBUMIN / CREATININE URINE RATIO
Creatinine, Urine: 147.4 mg/dL
MICROALB UR: 1 mg/dL (ref <2.0)
Microalb Creat Ratio: 6.8 mg/g (ref 0.0–30.0)

## 2014-02-11 LAB — INSULIN, FASTING: INSULIN FASTING, SERUM: 25.9 u[IU]/mL — AB (ref 2.0–19.6)

## 2014-02-11 LAB — VITAMIN D 25 HYDROXY (VIT D DEFICIENCY, FRACTURES): VIT D 25 HYDROXY: 73 ng/mL (ref 30–89)

## 2014-02-19 ENCOUNTER — Encounter: Payer: Self-pay | Admitting: Nurse Practitioner

## 2014-02-24 ENCOUNTER — Encounter: Payer: Self-pay | Admitting: Gastroenterology

## 2014-02-25 ENCOUNTER — Encounter: Payer: Self-pay | Admitting: Nurse Practitioner

## 2014-02-25 ENCOUNTER — Ambulatory Visit (INDEPENDENT_AMBULATORY_CARE_PROVIDER_SITE_OTHER): Payer: Medicare Other | Admitting: Nurse Practitioner

## 2014-02-25 VITALS — BP 120/88 | HR 64 | Ht 72.0 in | Wt 212.0 lb

## 2014-02-25 DIAGNOSIS — R109 Unspecified abdominal pain: Secondary | ICD-10-CM | POA: Insufficient documentation

## 2014-02-25 DIAGNOSIS — Z800 Family history of malignant neoplasm of digestive organs: Secondary | ICD-10-CM

## 2014-02-25 MED ORDER — DICYCLOMINE HCL 10 MG PO CAPS: 10.0000 mg | ORAL_CAPSULE | Freq: Two times a day (BID) | ORAL | Status: DC

## 2014-02-25 NOTE — Progress Notes (Signed)
HPI :  Patient is a 63 year old male known to Dr. Deatra Ina (last seen 2010). We previously evaluated him for iron deficiency anemia and chest pain. Colonoscopy was normal. Upper endoscopy revealed hemorrhagic gastritis and an acute antral ulcer. Most of the stomach was in his chest, there was some rotation suggestive of a volvulus or a least malrotation.  Patient underwent a laparascopic paraesophageal hernia repair, Nissen fundoplication and lysis of adhesions by Dr. Zenia Resides may 2010.  Patient has been referred by PCP for evaluation of abdominal pain. Pain is mid lower abdomen, it has been present since his TURP about one year ago. Pain is constant, feels like pressure. Not really exacerbated by eating unless he eats certain foods such as pizza. Patient avoids milk as it causes his stomach to rumble and leads to diarrhea. He complains of increased belching and flatulence. BMs are normal, he has one to 2 bowel movements a day. No rectal bleeding.  Past Medical History  Diagnosis Date  . Hyperlipidemia   . HTN (hypertension)   . Allergic rhinitis   . Headache(784.0)   . Nocturia   . Urinary retention   . UTI (urinary tract infection)   . GERD (gastroesophageal reflux disease)   . Prediabetes   . Depression   . Arthritis     Family History  Problem Relation Age of Onset  . Colon cancer Mother   . Prostate cancer Father   . Arthritis Brother    History  Substance Use Topics  . Smoking status: Passive Smoke Exposure - Never Smoker -- 34 years  . Smokeless tobacco: Former Systems developer    Types: [place] date: 01/03/1978  . Alcohol Use: No   Current Outpatient Prescriptions  Medication Sig Dispense Refill  . atenolol (TENORMIN) 100 MG tablet Take 1 tablet (100 mg total) by mouth daily.  90 tablet  1  . atorvastatin (LIPITOR) 80 MG tablet Take 1 tablet (80 mg total) by mouth 3 (three) times a week.  90 tablet  0  . B Complex-C (SUPER B COMPLEX PO) Take 1 tablet by mouth every morning.       . Cholecalciferol (VITAMIN D PO) Take 2,000 Int'l Units by mouth daily.      . cyclobenzaprine (FLEXERIL) 10 MG tablet Take 10 mg by mouth 3 (three) times daily as needed for muscle spasms.      . finasteride (PROSCAR) 5 MG tablet Take 5 mg by mouth daily.       . Magnesium 500 MG TABS Take 1 tablet by mouth daily.      . meloxicam (MOBIC) 15 MG tablet 1/2 to 1 tablet daily with food for arthritis pain and inflammation  90 tablet  99  . Multiple Vitamin (MULTIVITAMIN WITH MINERALS) TABS tablet Take 1 tablet by mouth every morning.       Marland Kitchen omeprazole (PRILOSEC) 20 MG capsule Take 20 mg by mouth daily.      . tamsulosin (FLOMAX) 0.4 MG CAPS capsule Take 0.4 mg by mouth 2 (two) times daily.       No current facility-administered medications for this visit.   No Known Allergies  Review of Systems: All systems reviewed and negative except where noted in HPI.   Physical Exam: BP 120/88  Pulse 64  Ht 6' (1.829 m)  Wt 212 lb (96.163 kg)  BMI 28.75 kg/m2 Constitutional: Pleasant,well-developed, white male in no acute distress. HEENT: Normocephalic and atraumatic. Conjunctivae are normal. No scleral icterus. Neck supple.  Cardiovascular: Normal rate, regular rhythm.  Pulmonary/chest: Effort normal and breath sounds normal. No wheezing, rales or rhonchi. Abdominal: Soft, nondistended, nontender. Bowel sounds active throughout. There are no masses palpable. No hepatomegaly. Extremities: no edema Lymphadenopathy: No cervical adenopathy noted. Neurological: Alert and oriented to person place and time. Skin: Skin is warm and dry. No rashes noted. Psychiatric: Normal mood and affect. Behavior is normal.   ASSESSMENT AND PLAN:  36. 63 year old male with one year history of mid lower abdominal pressure (started after TURP). BMs okay. He does complain of gas / belching and some intolerance to milk. No alarm features. Last colonoscopy was 2010, limited by retained stool. Pain could be IBS, will  try Dicyclomine. I did recommend colonoscopy given that one in 2010 may have been suboptimal (retained stool) AND because of a [facility] of colon cancer in mother. Patient doesn't want to commit to a colonoscopy right now. He lives alone, has no one to help him or drive him to procedure. Told him we could discuss further at follow up visit. In meantime patient will call in a couple of weeks with a condition update.   2. [facility] colon cancer. Mother died of colon cancer in her 48's.

## 2014-02-25 NOTE — Patient Instructions (Signed)
We have sent the following medications to your pharmacy for you to pick up at your convenience: Bentyl 10 mg  You have a follow up visit scheduled with Dr. Deatra Ina for 04-23-2014 at 9:15 am  Please call back in seven days with an update on how you are feeling. You can speak to Deepstep M.

## 2014-02-26 NOTE — Progress Notes (Signed)
Reviewed and agree with management. [place] Stanger D. Jackee Glasner, M.D., FACG  

## 2014-03-04 ENCOUNTER — Telehealth: Payer: Self-pay | Admitting: Gastroenterology

## 2014-03-04 NOTE — Telephone Encounter (Signed)
Pt was seen by Tye Savoy NP last week and placed on bentyl for abdominal discomfort and told to call back with an update. Pt states his discomfort is a little better but not gone. States now the discomfort is in the middle of his stomach. States he was told if the discomfort continued he would have an xray. Please advise.

## 2014-03-04 NOTE — Telephone Encounter (Signed)
Left message for pt to call back.  Pt scheduled for previsit and ECL in the [place]. Pt stated that he did not have transportation and would need to wait until December when his brother could come with him and bring him. Pt aware of appts.

## 2014-03-04 NOTE — Telephone Encounter (Signed)
If he still having abdominal pain he should be scheduled for upper endoscopy.  He is due for colonoscopy which could be scheduled at the same sitting

## 2014-04-17 ENCOUNTER — Other Ambulatory Visit: Payer: Self-pay | Admitting: *Deleted

## 2014-04-17 ENCOUNTER — Ambulatory Visit (AMBULATORY_SURGERY_CENTER): Payer: Self-pay

## 2014-04-17 VITALS — Ht 73.0 in | Wt 215.8 lb

## 2014-04-17 DIAGNOSIS — R1013 Epigastric pain: Secondary | ICD-10-CM

## 2014-04-17 DIAGNOSIS — G8929 Other chronic pain: Secondary | ICD-10-CM

## 2014-04-17 DIAGNOSIS — Z800 Family history of malignant neoplasm of digestive organs: Secondary | ICD-10-CM

## 2014-04-17 MED ORDER — NA SULFATE-K SULFATE-MG SULF 17.5-3.13-1.6 GM/177ML PO SOLN: 1.0000 | Freq: Once | ORAL | Status: DC

## 2014-04-17 MED ORDER — NA SULFATE-K SULFATE-MG SULF 17.5-3.13-1.6 GM/177ML PO SOLN: ORAL | Status: DC

## 2014-04-17 NOTE — Progress Notes (Signed)
Per pt, no allergies to soy or egg products.Pt not taking any weight loss meds or using  O2 at home. 

## 2014-04-22 ENCOUNTER — Ambulatory Visit (AMBULATORY_SURGERY_CENTER): Payer: Medicare Other | Admitting: Gastroenterology

## 2014-04-22 ENCOUNTER — Encounter: Payer: Self-pay | Admitting: Gastroenterology

## 2014-04-22 VITALS — BP 110/70 | HR 54 | Temp 96.6°F | Resp 17

## 2014-04-22 DIAGNOSIS — K2990 Gastroduodenitis, unspecified, without bleeding: Secondary | ICD-10-CM

## 2014-04-22 DIAGNOSIS — D120 Benign neoplasm of cecum: Secondary | ICD-10-CM

## 2014-04-22 DIAGNOSIS — Z1211 Encounter for screening for malignant neoplasm of colon: Secondary | ICD-10-CM

## 2014-04-22 DIAGNOSIS — D124 Benign neoplasm of descending colon: Secondary | ICD-10-CM

## 2014-04-22 DIAGNOSIS — K2970 Gastritis, unspecified, without bleeding: Secondary | ICD-10-CM

## 2014-04-22 DIAGNOSIS — Z800 Family history of malignant neoplasm of digestive organs: Secondary | ICD-10-CM

## 2014-04-22 DIAGNOSIS — R1013 Epigastric pain: Secondary | ICD-10-CM

## 2014-04-22 MED ORDER — SODIUM CHLORIDE 0.9 % IV SOLN: 500.0000 mL | INTRAVENOUS | Status: DC

## 2014-04-22 NOTE — Progress Notes (Signed)
A/ox3 pleased with MAC, report to Celia RN 

## 2014-04-22 NOTE — Patient Instructions (Addendum)
Findings;  Polyps, gastritis Recommendations:  Repeat colonoscopy in 5 years, wait for biopsy results, office will schedule a gastric empyting study and call you with appointment. Continue PPI YOU HAD AN ENDOSCOPIC PROCEDURE TODAY AT [facility]: Refer to the procedure report that was given to you for any specific questions about what was found during the examination.  If the procedure report does not answer your questions, please call your gastroenterologist to clarify.  If you requested that your care partner not be given the details of your procedure findings, then the procedure report has been included in a sealed envelope for you to review at your convenience later.  YOU SHOULD EXPECT: Some feelings of bloating in the abdomen. Passage of more gas than usual.  Walking can help get rid of the air that was put into your GI tract during the procedure and reduce the bloating. If you had a lower endoscopy (such as a colonoscopy or flexible sigmoidoscopy) you may notice spotting of blood in your stool or on the toilet paper. If you underwent a bowel prep for your procedure, then you may not have a normal bowel movement for a few days.  DIET: Your first meal following the procedure should be a light meal and then it is ok to progress to your normal diet.  A half-sandwich or bowl of soup is an example of a good first meal.  Heavy or fried foods are harder to digest and may make you feel nauseous or bloated.  Likewise meals heavy in dairy and vegetables can cause extra gas to form and this can also increase the bloating.  Drink plenty of fluids but you should avoid alcoholic beverages for 24 hours.  ACTIVITY: Your care partner should take you home directly after the procedure.  You should plan to take it easy, moving slowly for the rest of the day.  You can resume normal activity the day after the procedure however you should NOT DRIVE or use heavy machinery for 24 hours (because of the sedation  medicines used during the test).    SYMPTOMS TO REPORT IMMEDIATELY: A gastroenterologist can be reached at any hour.  During normal business hours, 8:30 AM to 5:00 PM Monday through Friday, call [phone].  After hours and on weekends, please call the GI answering service at [phone] who will take a message and have the physician on call contact you.   Following lower endoscopy (colonoscopy or flexible sigmoidoscopy):  Excessive amounts of blood in the stool  Significant tenderness or worsening of abdominal pains  Swelling of the abdomen that is new, acute  Fever of 100F or higher  Following upper endoscopy (EGD)  Vomiting of blood or coffee ground material  New chest pain or pain under the shoulder blades  Painful or persistently difficult swallowing  New shortness of breath  Fever of 100F or higher  Black, tarry-looking stools  FOLLOW UP: If any biopsies were taken you will be contacted by phone or by letter within the next 1-3 weeks.  Call your gastroenterologist if you have not heard about the biopsies in 3 weeks.  Our staff will call the home number listed on your records the next business day following your procedure to check on you and address any questions or concerns that you may have at that time regarding the information given to you following your procedure. This is a courtesy call and so if there is no answer at the home number and we have not  heard from you through the emergency physician on call, we will assume that you have returned to your regular daily activities without incident.  SIGNATURES/CONFIDENTIALITY: You and/or your care partner have signed paperwork which will be entered into your electronic medical record.  These signatures attest to the fact that that the information above on your After Visit Summary has been reviewed and is understood.  Full responsibility of the confidentiality of this discharge information lies with you and/or your  care-partner.  Please follow all discharge instructions given to you by the recovery room nurse. If you have any questions or problems after discharge please call one of the numbers listed above. You will receive a phone call in the am to see how you are doing and answer any questions you may have. Thank you for choosing [facility] for your health care needs.

## 2014-04-22 NOTE — Op Note (Signed)
[facility]  [address] [place], [id number]   COLONOSCOPY PROCEDURE REPORT  PATIENT: [name], [name]  MR#: [id number] BIRTHDATE: 1950/12/12 , 72  yrs. old GENDER: male ENDOSCOPIST: Inda Castle, MD REFERRED EX:[id number] Melford Aase, M.D. PROCEDURE DATE:  04/22/2014 PROCEDURE:   Colonoscopy with snare polypectomy First Screening Colonoscopy - Avg.  risk and is 50 yrs.  old or older - No.  Prior Negative Screening - Now for repeat screening. Above average risk  History of Adenoma - Now for follow-up colonoscopy & has been > or = to 3 yrs.  N/A  Polyps Removed Today? Yes. ASA CLASS:   Class II INDICATIONS:patient's immediate family history of colon cancer mother MEDICATIONS: Monitored anesthesia care and Propofol 350 mg IV  DESCRIPTION OF PROCEDURE:   After the risks benefits and alternatives of the procedure were thoroughly explained, informed consent was obtained.  The digital rectal exam revealed no abnormalities of the rectum.   The [id number]  endoscope was introduced through the anus and advanced to the cecum, which was identified by both the appendix and ileocecal valve. No adverse events experienced.   The quality of the prep was Suprep good  The instrument was then slowly withdrawn as the colon was fully examined.      COLON FINDINGS: A sessile polyp measuring 4 mm in size was found at the cecum.  A polypectomy was performed with a cold snare.  The resection was complete, the polyp tissue was completely retrieved and sent to histology.   A sessile polyp measuring 5 mm in size was found in the descending colon.  A polypectomy was performed with a cold snare.  The resection was complete, the polyp tissue was completely retrieved and sent to histology.   The examination was otherwise normal.  Retroflexed views revealed no abnormalities. The time to cecum=13 minutes 40 seconds.  Withdrawal time=19 minutes 29 seconds.  The scope was withdrawn and the  procedure completed. COMPLICATIONS: There were no immediate complications.  ENDOSCOPIC IMPRESSION: 1.   Sessile polyp was found at the cecum; polypectomy was performed with a cold snare 2.   Sessile polyp was found in the descending colon; polypectomy was performed with a cold snare 3.   The examination was otherwise normal  RECOMMENDATIONS: Given your significant family history of colon cancer, you should have a repeat colonoscopy in 5 years  eSigned:  Inda Castle, MD 04/22/2014 12:12 PM   cc:   PATIENT NAME:  [name], [name] MR#: [id number]

## 2014-04-22 NOTE — Progress Notes (Signed)
Called to room to assist during endoscopic procedure.  Patient ID and intended procedure confirmed with present staff. Received instructions for my participation in the procedure from the performing physician.  

## 2014-04-23 ENCOUNTER — Telehealth: Payer: Self-pay

## 2014-04-23 ENCOUNTER — Ambulatory Visit: Payer: Medicare Other | Admitting: Gastroenterology

## 2014-04-23 NOTE — Telephone Encounter (Signed)
  Follow up Call-  Call back number 04/22/2014  Post procedure Call Back phone  # [phone]  Permission to leave phone message Yes     Patient questions:  Do you have a fever, pain , or abdominal swelling? No. Pain Score  0 *  Have you tolerated food without any problems? Yes.    Have you been able to return to your normal activities? Yes.    Do you have any questions about your discharge instructions: Diet   No. Medications  No. Follow up visit  No.  Do you have questions or concerns about your Care? No.  Actions: * If pain score is 4 or above: No action needed, pain <4.

## 2014-04-23 NOTE — Op Note (Signed)
[facility]  [address] [place], [id number]   ENDOSCOPY PROCEDURE REPORT  PATIENT: Mathews, Stuhr  MR#: [id number] BIRTHDATE: 1951-01-17 , 80  yrs. old GENDER: male ENDOSCOPIST: Inda Castle, MD REFERRED BY:  Unk Pinto, M.D. PROCEDURE DATE:  04/22/2014 PROCEDURE:  EGD w/ biopsy ASA CLASS:     Class II INDICATIONS:  epigastric pain. MEDICATIONS: Residual sedation present, Monitored anesthesia care, and Propofol 50 mg IV TOPICAL ANESTHETIC:  DESCRIPTION OF PROCEDURE: After the risks benefits and alternatives of the procedure were thoroughly explained, informed consent was obtained.  The LB [id number] endoscope was introduced through the mouth and advanced to the second portion of the duodenum , Without limitations.  The instrument was slowly withdrawn as the mucosa was fully examined.    DUODENUM: Mild duodenal inflammation was found in the duodenal bulb. Retained gastric contents.  Retroflexed views revealed no abnormalities.     The scope was then withdrawn from the patient and the procedure completed.  COMPLICATIONS: There were no immediate complications.  ENDOSCOPIC IMPRESSION: 1.   Duodenal inflammation was found in the duodenal bulb 2.   Retained gastric contents 3.   Chronic gastritis (inflammation) was found in the gastric fundus; multiple biopsies were performed  RECOMMENDATIONS: 1.  Await biopsy results 2.  My office will arrange for you to have a Gastric Emptying Scan performed.  This is a radiology test that gives an idea of how well your stomach functions. 3.  Continue PPI  REPEAT EXAM:  eSigned:  Inda Castle, MD 04/22/2014 12:17 PM    CC:

## 2014-04-25 ENCOUNTER — Other Ambulatory Visit: Payer: Self-pay

## 2014-04-25 DIAGNOSIS — R1013 Epigastric pain: Secondary | ICD-10-CM

## 2014-04-28 ENCOUNTER — Encounter: Payer: Self-pay | Admitting: Gastroenterology

## 2014-05-13 ENCOUNTER — Ambulatory Visit (HOSPITAL_COMMUNITY)
Admission: RE | Admit: 2014-05-13 | Discharge: 2014-05-13 | Disposition: A | Discharge status: Home / Self Care | Payer: Medicare Other | Source: Ambulatory Visit | Attending: Gastroenterology | Admitting: Gastroenterology

## 2014-05-13 DIAGNOSIS — R1110 Vomiting, unspecified: Secondary | ICD-10-CM | POA: Insufficient documentation

## 2014-05-13 DIAGNOSIS — R1013 Epigastric pain: Secondary | ICD-10-CM | POA: Diagnosis not present

## 2014-05-13 DIAGNOSIS — R634 Abnormal weight loss: Secondary | ICD-10-CM | POA: Insufficient documentation

## 2014-05-13 MED ORDER — TECHNETIUM TC 99M SULFUR COLLOID
2.1000 | Freq: Once | INTRAVENOUS | Status: AC | PRN
  Administered 2014-05-13: 2.1 via INTRAVENOUS

## 2014-05-15 ENCOUNTER — Telehealth: Payer: Self-pay | Admitting: Gastroenterology

## 2014-05-15 NOTE — Telephone Encounter (Signed)
Spoke with the patient. He will expect a call from [place] on Monday with recommendations.

## 2014-05-20 ENCOUNTER — Other Ambulatory Visit: Payer: Self-pay

## 2014-05-20 MED ORDER — METOCLOPRAMIDE HCL 10 MG PO TABS: 10.0000 mg | ORAL_TABLET | Freq: Three times a day (TID) | ORAL | Status: AC

## 2014-05-21 ENCOUNTER — Telehealth: Payer: Self-pay | Admitting: Gastroenterology

## 2014-05-21 NOTE — Telephone Encounter (Signed)
I agree

## 2014-05-21 NOTE — Telephone Encounter (Signed)
Patient states he started taking Reglan yesterday. He is still getting sick after eating. States he ate tomato soup at lunch and got sick. Discussed that tomato soup may not be best choice at this time. He will try chicken soup,potato soup and avoid tomato based products.

## 2014-05-22 ENCOUNTER — Other Ambulatory Visit: Payer: Self-pay | Admitting: Physician Assistant

## 2014-05-22 ENCOUNTER — Ambulatory Visit (INDEPENDENT_AMBULATORY_CARE_PROVIDER_SITE_OTHER): Payer: Commercial Managed Care - HMO | Admitting: Physician Assistant

## 2014-05-22 ENCOUNTER — Encounter: Payer: Self-pay | Admitting: Physician Assistant

## 2014-05-22 VITALS — BP 122/78 | HR 64 | Temp 98.1°F | Resp 16 | Ht 72.75 in | Wt 209.0 lb

## 2014-05-22 DIAGNOSIS — R945 Abnormal results of liver function studies: Secondary | ICD-10-CM

## 2014-05-22 DIAGNOSIS — K3184 Gastroparesis: Secondary | ICD-10-CM

## 2014-05-22 DIAGNOSIS — I10 Essential (primary) hypertension: Secondary | ICD-10-CM

## 2014-05-22 DIAGNOSIS — K219 Gastro-esophageal reflux disease without esophagitis: Secondary | ICD-10-CM

## 2014-05-22 DIAGNOSIS — R7989 Other specified abnormal findings of blood chemistry: Secondary | ICD-10-CM

## 2014-05-22 DIAGNOSIS — Z79899 Other long term (current) drug therapy: Secondary | ICD-10-CM | POA: Insufficient documentation

## 2014-05-22 DIAGNOSIS — N183 Chronic kidney disease, stage 3 unspecified: Secondary | ICD-10-CM

## 2014-05-22 DIAGNOSIS — E559 Vitamin D deficiency, unspecified: Secondary | ICD-10-CM

## 2014-05-22 DIAGNOSIS — R7303 Prediabetes: Secondary | ICD-10-CM

## 2014-05-22 DIAGNOSIS — E782 Mixed hyperlipidemia: Secondary | ICD-10-CM

## 2014-05-22 DIAGNOSIS — R7309 Other abnormal glucose: Secondary | ICD-10-CM

## 2014-05-22 MED ORDER — OMEPRAZOLE 40 MG PO CPDR: 40.0000 mg | DELAYED_RELEASE_CAPSULE | Freq: Every day | ORAL | Status: AC

## 2014-05-22 MED ORDER — OMEPRAZOLE 40 MG PO CPDR: 40.0000 mg | DELAYED_RELEASE_CAPSULE | Freq: Every day | ORAL | Status: DC

## 2014-05-22 NOTE — Progress Notes (Addendum)
Assessment and Plan:  Hypertension: Continue medication, monitor blood pressure at home. Continue DASH diet.  Reminder to go to the ER if any CP, SOB, nausea, dizziness, severe HA, changes vision/speech, left arm numbness and tingling, and jaw pain. Cholesterol: Continue diet and exercise. Check cholesterol.  Pre-diabetes-Continue diet and exercise. Check A1C Vitamin D Def- check level and continue medications.  GERD- will send in omeprazole 40mg  to decrease cost  Continue diet and meds as discussed. Further disposition pending results of labs.  Addendum: Elevated LFTs, stop statin, get AB [place], will send labs to [facility], follow up 2-4 weeks for LFTs and other labs.   HPI 64 y.o. male  presents for 3 month follow up with hypertension, hyperlipidemia, prediabetes and vitamin D. His blood pressure has been controlled at home, he is taking 1/2 of the atenolol, today their BP is BP: 122/78 mmHg He does workout, going to gym. He denies chest pain, shortness of breath, dizziness.  He is on cholesterol medication, lipitor 80 1/2 a pill 3 times a week and denies myalgias. His cholesterol is at goal. The cholesterol last visit was:   Lab Results  Component Value Date   CHOL 106 02/10/2014   HDL 42 02/10/2014   LDLCALC 33 02/10/2014   TRIG 153* 02/10/2014   CHOLHDL 2.5 02/10/2014  He has been working on diet and exercise for prediabetes, and denies paresthesia of the feet, polydipsia, polyuria and visual disturbances. Last A1C in the office was:  Lab Results  Component Value Date   HGBA1C 6.4* 02/10/2014  Patient is on Vitamin D supplement.   Lab Results  Component Value Date   VD25OH 3 02/10/2014  Patient had colonoscopy and EGD with Dr. Deatra Ina last month this showed undigested food and he had a gastric emptying study and, he is now on reglan for GERD and states it is helping. Has follow up in Feb.    Current Medications:  Current Outpatient Prescriptions on File Prior to Visit  Medication  Sig Dispense Refill  . atenolol (TENORMIN) 100 MG tablet Take 1 tablet (100 mg total) by mouth daily. 90 tablet 1  . atorvastatin (LIPITOR) 80 MG tablet Take 1 tablet (80 mg total) by mouth 3 (three) times a week. 90 tablet 0  . B Complex-C (SUPER B COMPLEX PO) Take 1 tablet by mouth every morning.    . Cholecalciferol (VITAMIN D PO) Take 2,000 Int'l Units by mouth daily.    . cyclobenzaprine (FLEXERIL) 10 MG tablet Take 10 mg by mouth 3 (three) times daily as needed for muscle spasms.    Marland Kitchen esomeprazole (NEXIUM) 20 MG packet Take 20 mg by mouth daily before breakfast.    . finasteride (PROSCAR) 5 MG tablet Take 5 mg by mouth daily.     . Magnesium 500 MG TABS Take 1 tablet by mouth daily.    . meloxicam (MOBIC) 15 MG tablet 1/2 to 1 tablet daily with food for arthritis pain and inflammation 90 tablet 99  . metoCLOPramide (REGLAN) 10 MG tablet Take 1 tablet (10 mg total) by mouth 4 (four) times daily -  before meals and at bedtime. 120 tablet 1  . Multiple Vitamin (MULTIVITAMIN WITH MINERALS) TABS tablet Take 1 tablet by mouth every morning.     Marland Kitchen omega-3 acid ethyl esters (LOVAZA) 1 G capsule Take by mouth 2 (two) times daily.    . tamsulosin (FLOMAX) 0.4 MG CAPS capsule Take 0.4 mg by mouth 2 (two) times daily.     No  current facility-administered medications on file prior to visit.   Medical History:  Past Medical History  Diagnosis Date  . Hyperlipidemia   . HTN (hypertension)   . Allergic rhinitis   . Headache(784.0)   . Nocturia   . Urinary retention   . Foley catheter in place   . UTI (urinary tract infection)   . GERD (gastroesophageal reflux disease)   . Prediabetes   . Depression   . Arthritis   . Bell's palsy    Allergies:  Allergies  Allergen Reactions  . Chloraseptic Sore Throat [Acetaminophen]     Spray burnt mouth!  . Milk-Related Compounds     Causes gas and diarrhea     Review of Systems:  Review of Systems  Constitutional: Negative.   HENT: Positive for  hearing loss (got hearing aids recently). Negative for congestion, ear discharge, ear pain, nosebleeds, sore throat and tinnitus.   Respiratory: Positive for cough. Negative for hemoptysis, sputum production, shortness of breath, wheezing and stridor.   Cardiovascular: Negative.   Gastrointestinal: Positive for heartburn, nausea and abdominal pain. Negative for vomiting, diarrhea, constipation, blood in stool and melena.  Genitourinary: Negative.   Musculoskeletal: Positive for joint pain (bilateral elbows). Negative for myalgias, back pain, falls and neck pain.  Skin: Negative.   Neurological: Negative.  Negative for headaches.  Psychiatric/Behavioral: Negative.     Family history- Review and unchanged Social history- Review and unchanged Physical Exam: BP 122/78 mmHg  Pulse 64  Temp(Src) 98.1 F (36.7 C)  Resp 16  Ht 6' 0.75" (1.848 m)  Wt 209 lb (94.802 kg)  BMI 27.76 kg/m2 Wt Readings from Last 3 Encounters:  05/22/14 209 lb (94.802 kg)  04/17/14 215 lb 12.8 oz (97.886 kg)  02/25/14 212 lb (96.163 kg)   General Appearance: Well nourished, in no apparent distress. Eyes: PERRLA, EOMs, conjunctiva no swelling or erythema Sinuses: No Frontal/maxillary tenderness ENT/Mouth: Ext aud canals clear, TMs without erythema, bulging. No erythema, swelling, or exudate on post pharynx.  Tonsils not swollen or erythematous. Hearing normal.  Neck: Supple, thyroid normal.  Respiratory: Respiratory effort normal, BS equal bilaterally without rales, rhonchi, wheezing or stridor.  Cardio: RRR with no MRGs. Brisk peripheral pulses without edema.  Abdomen: Soft, + BS.  + epigastric tenderness, no guarding, rebound, hernias, masses. Lymphatics: Non tender without lymphadenopathy.  Musculoskeletal: Full ROM, 5/5 strength, normal gait.  Skin: Warm, dry without rashes, lesions, ecchymosis.  Neuro: Cranial nerves intact. Normal muscle tone, no cerebellar symptoms. Sensation intact.  Psych: Awake and  oriented X 3, normal affect, Insight and Judgment appropriate.    Vicie Mutters, PA-C 1:43 PM [facility]

## 2014-05-22 NOTE — Patient Instructions (Signed)
    Bad carbs also include fruit juice, alcohol, and sweet tea. These are empty calories that do not signal to your brain that you are full.   Please remember the good carbs are still carbs which convert into sugar. So please measure them out no more than 1/2-1 cup of rice, oatmeal, pasta, and beans  Veggies are however free foods! Pile them on.   Not all fruit is created equal. Please see the list below, the fruit at the bottom is higher in sugars than the fruit at the top. Please avoid all dried fruits.       Food Choices for Gastroesophageal Reflux Disease When you have gastroesophageal reflux disease (GERD), the foods you eat and your eating habits are very important. Choosing the right foods can help ease the discomfort of GERD. WHAT GENERAL GUIDELINES DO I NEED TO FOLLOW?  Choose fruits, vegetables, whole grains, low-fat dairy products, and low-fat meat, fish, and poultry.  Limit fats such as oils, salad dressings, butter, nuts, and avocado.  Keep a food diary to identify foods that cause symptoms.  Avoid foods that cause reflux. These may be different for different people.  Eat frequent small meals instead of three large meals each day.  Eat your meals slowly, in a relaxed setting.  Limit fried foods.  Cook foods using methods other than frying.  Avoid drinking alcohol.  Avoid drinking large amounts of liquids with your meals.  Avoid bending over or lying down until 2-3 hours after eating. WHAT FOODS ARE NOT RECOMMENDED? The following are some foods and drinks that may worsen your symptoms: Vegetables Tomatoes. Tomato juice. Tomato and spaghetti sauce. Chili peppers. Onion and garlic. Horseradish. Fruits Oranges, grapefruit, and lemon (fruit and juice). Meats High-fat meats, fish, and poultry. This includes hot dogs, ribs, ham, sausage, salami, and bacon. Dairy Whole milk and chocolate milk. Sour cream. Cream. Butter. Ice cream. Cream cheese.  Beverages Coffee  and tea, with or without caffeine. Carbonated beverages or energy drinks. Condiments Hot sauce. Barbecue sauce.  Sweets/Desserts Chocolate and cocoa. Donuts. Peppermint and spearmint. Fats and Oils High-fat foods, including [place] fries and potato chips. Other Vinegar. Strong spices, such as black pepper, white pepper, red pepper, cayenne, curry powder, cloves, ginger, and chili powder.

## 2014-05-23 LAB — CBC WITH DIFFERENTIAL/PLATELET
BASOS ABS: 0 10*3/uL (ref 0.0–0.1)
Basophils Relative: 0 % (ref 0–1)
Eosinophils Absolute: 0.2 10*3/uL (ref 0.0–0.7)
Eosinophils Relative: 2 % (ref 0–5)
HEMATOCRIT: 44.7 % (ref 39.0–52.0)
Hemoglobin: 15 g/dL (ref 13.0–17.0)
Lymphocytes Relative: 27 % (ref 12–46)
Lymphs Abs: 2.1 10*3/uL (ref 0.7–4.0)
MCH: 32.2 pg (ref 26.0–34.0)
MCHC: 33.6 g/dL (ref 30.0–36.0)
MCV: 95.9 fL (ref 78.0–100.0)
MONOS PCT: 9 % (ref 3–12)
MPV: 12.1 fL (ref 8.6–12.4)
Monocytes Absolute: 0.7 10*3/uL (ref 0.1–1.0)
Neutro Abs: 4.8 10*3/uL (ref 1.7–7.7)
Neutrophils Relative %: 62 % (ref 43–77)
Platelets: 127 10*3/uL — ABNORMAL LOW (ref 150–400)
RBC: 4.66 MIL/uL (ref 4.22–5.81)
RDW: 14.2 % (ref 11.5–15.5)
WBC: 7.8 10*3/uL (ref 4.0–10.5)

## 2014-05-23 LAB — HEPATIC FUNCTION PANEL
ALBUMIN: 3.6 g/dL (ref 3.5–5.2)
ALT: 317 U/L — ABNORMAL HIGH (ref 0–53)
AST: 182 U/L — ABNORMAL HIGH (ref 0–37)
Alkaline Phosphatase: 61 U/L (ref 39–117)
BILIRUBIN INDIRECT: 0.9 mg/dL (ref 0.2–1.2)
Bilirubin, Direct: 0.5 mg/dL — ABNORMAL HIGH (ref 0.0–0.3)
Total Bilirubin: 1.4 mg/dL — ABNORMAL HIGH (ref 0.2–1.2)
Total Protein: 5.6 g/dL — ABNORMAL LOW (ref 6.0–8.3)

## 2014-05-23 LAB — HEMOGLOBIN A1C
Hgb A1c MFr Bld: 6 % — ABNORMAL HIGH (ref <5.7)
Mean Plasma Glucose: 126 mg/dL — ABNORMAL HIGH (ref <117)

## 2014-05-23 LAB — TSH: TSH: 0.794 u[IU]/mL (ref 0.350–4.500)

## 2014-05-23 LAB — INSULIN, FASTING: INSULIN FASTING, SERUM: 15.2 u[IU]/mL (ref 2.0–19.6)

## 2014-05-23 LAB — LIPID PANEL
Cholesterol: 74 mg/dL (ref 0–200)
HDL: 49 mg/dL (ref >39)
LDL CALC: 15 mg/dL (ref 0–99)
Total CHOL/HDL Ratio: 1.5 Ratio
Triglycerides: 52 mg/dL (ref <150)
VLDL: 10 mg/dL (ref 0–40)

## 2014-05-23 LAB — BASIC METABOLIC PANEL WITH GFR
BUN: 29 mg/dL — AB (ref 6–23)
CHLORIDE: 104 meq/L (ref 96–112)
CO2: 30 meq/L (ref 19–32)
CREATININE: 1.58 mg/dL — AB (ref 0.50–1.35)
Calcium: 9 mg/dL (ref 8.4–10.5)
GFR, EST AFRICAN AMERICAN: 52 mL/min — AB
GFR, Est Non African American: 45 mL/min — ABNORMAL LOW
GLUCOSE: 123 mg/dL — AB (ref 70–99)
Potassium: 4.4 mEq/L (ref 3.5–5.3)
Sodium: 139 mEq/L (ref 135–145)

## 2014-05-23 LAB — MAGNESIUM: Magnesium: 1.9 mg/dL (ref 1.5–2.5)

## 2014-05-23 LAB — VITAMIN D 25 HYDROXY (VIT D DEFICIENCY, FRACTURES): VIT D 25 HYDROXY: 81 ng/mL (ref 30–100)

## 2014-05-23 NOTE — Addendum Note (Signed)
Addended by: Vicie Mutters R on: 05/23/2014 08:44 AM   Modules accepted: Orders

## 2014-05-26 LAB — CLIENT PROFILE 37 (CHRONIC HEPATITIS)
HCV Ab: NEGATIVE
Hep A Total Ab: NONREACTIVE
Hep B Core Total Ab: NONREACTIVE
Hep B S Ab: POSITIVE — AB
Hepatitis Be Antibody: NONREACTIVE

## 2014-06-02 ENCOUNTER — Ambulatory Visit
Admission: RE | Admit: 2014-06-02 | Discharge: 2014-06-02 | Disposition: A | Discharge status: Home / Self Care | Payer: Commercial Managed Care - HMO | Source: Ambulatory Visit | Attending: Physician Assistant | Admitting: Physician Assistant

## 2014-06-02 DIAGNOSIS — R7989 Other specified abnormal findings of blood chemistry: Secondary | ICD-10-CM

## 2014-06-02 DIAGNOSIS — R945 Abnormal results of liver function studies: Principal | ICD-10-CM

## 2014-06-20 ENCOUNTER — Other Ambulatory Visit (INDEPENDENT_AMBULATORY_CARE_PROVIDER_SITE_OTHER): Payer: Commercial Managed Care - HMO

## 2014-06-20 ENCOUNTER — Encounter: Payer: Self-pay | Admitting: Gastroenterology

## 2014-06-20 ENCOUNTER — Ambulatory Visit (INDEPENDENT_AMBULATORY_CARE_PROVIDER_SITE_OTHER): Payer: Commercial Managed Care - HMO | Admitting: Gastroenterology

## 2014-06-20 VITALS — BP 100/70 | HR 72 | Ht 72.0 in | Wt 197.1 lb

## 2014-06-20 DIAGNOSIS — R945 Abnormal results of liver function studies: Secondary | ICD-10-CM | POA: Insufficient documentation

## 2014-06-20 DIAGNOSIS — R7989 Other specified abnormal findings of blood chemistry: Secondary | ICD-10-CM

## 2014-06-20 DIAGNOSIS — R1084 Generalized abdominal pain: Secondary | ICD-10-CM

## 2014-06-20 DIAGNOSIS — R1013 Epigastric pain: Secondary | ICD-10-CM

## 2014-06-20 LAB — CBC WITH DIFFERENTIAL/PLATELET
BASOS ABS: 0 10*3/uL (ref 0.0–0.1)
BASOS PCT: 0.1 % (ref 0.0–3.0)
EOS ABS: 0 10*3/uL (ref 0.0–0.7)
Eosinophils Relative: 0.1 % (ref 0.0–5.0)
HCT: 44.8 % (ref 39.0–52.0)
HEMOGLOBIN: 15.3 g/dL (ref 13.0–17.0)
LYMPHS ABS: 1 10*3/uL (ref 0.7–4.0)
Lymphocytes Relative: 7.2 % — ABNORMAL LOW (ref 12.0–46.0)
MCHC: 34.2 g/dL (ref 30.0–36.0)
MCV: 96.3 fl (ref 78.0–100.0)
MONO ABS: 1 10*3/uL (ref 0.1–1.0)
Monocytes Relative: 7.3 % (ref 3.0–12.0)
NEUTROS ABS: 11.5 10*3/uL — AB (ref 1.4–7.7)
Platelets: 101 10*3/uL — ABNORMAL LOW (ref 150.0–400.0)
RBC: 4.65 Mil/uL (ref 4.22–5.81)
RDW: 16.6 % — ABNORMAL HIGH (ref 11.5–15.5)
WBC: 13.4 10*3/uL — ABNORMAL HIGH (ref 4.0–10.5)

## 2014-06-20 LAB — HEPATIC FUNCTION PANEL
ALBUMIN: 3.1 g/dL — AB (ref 3.5–5.2)
ALT: 312 U/L — ABNORMAL HIGH (ref 0–53)
AST: 192 U/L — ABNORMAL HIGH (ref 0–37)
Alkaline Phosphatase: 80 U/L (ref 39–117)
BILIRUBIN DIRECT: 2.9 mg/dL — AB (ref 0.0–0.3)
Total Bilirubin: 5.3 mg/dL — ABNORMAL HIGH (ref 0.2–1.2)
Total Protein: 5.3 g/dL — ABNORMAL LOW (ref 6.0–8.3)

## 2014-06-20 LAB — COMPREHENSIVE METABOLIC PANEL
ALT: 312 U/L — AB (ref 0–53)
AST: 192 U/L — AB (ref 0–37)
Albumin: 3.1 g/dL — ABNORMAL LOW (ref 3.5–5.2)
Alkaline Phosphatase: 80 U/L (ref 39–117)
BILIRUBIN TOTAL: 5.3 mg/dL — AB (ref 0.2–1.2)
BUN: 34 mg/dL — AB (ref 6–23)
CALCIUM: 8.9 mg/dL (ref 8.4–10.5)
CHLORIDE: 103 meq/L (ref 96–112)
CO2: 25 meq/L (ref 19–32)
Creatinine, Ser: 2.02 mg/dL — ABNORMAL HIGH (ref 0.40–1.50)
GFR: 35.31 mL/min — AB (ref >60.00)
Glucose, Bld: 102 mg/dL — ABNORMAL HIGH (ref 70–99)
Potassium: 5.1 mEq/L (ref 3.5–5.1)
Sodium: 137 mEq/L (ref 135–145)
TOTAL PROTEIN: 5.3 g/dL — AB (ref 6.0–8.3)

## 2014-06-20 LAB — PROTIME-INR
INR: 1.4 ratio — ABNORMAL HIGH (ref 0.8–1.0)
Prothrombin Time: 15.1 s — ABNORMAL HIGH (ref 9.6–13.1)

## 2014-06-20 NOTE — Assessment & Plan Note (Signed)
Patient has a transaminitis which could be medicine-related.  Daughter was recently discontinued.  There are no obvious liver abnormalities by ultrasound.  Allergies for hepatitis are negative.  Recommendations #1 CT the abdomen #2 check antinuclear antibody and antimitochondrial antibody

## 2014-06-20 NOTE — Progress Notes (Signed)
      History of Present Illness:  [name] has returned for follow-up of abdominal pain.  Pain continues.  He describes midepigastric and periumbilical discomfort that is fairly constant.  It is without radiation.  Into this may worsened postprandially.  He has a sense of fullness.  He's lost 10 pounds.  Gastric emptying scan demonstrated significant gastroparesis.  Reglan did not improve his symptoms.  Recent upper endoscopy and colonoscopy were unremarkable for explaining abdominal pain.  Ultrasound last month demonstrated renal cysts and a small liver cysts.  Abnormal LFTs were noted including AST 182 and ALT 317.  Alkaline phosphatase was normal as were serologies for hepatitis A and C.  He is hepatitis B surface antibody positive with a negative core antibody.  Increase omeprazole to 40 mg daily and developed dizziness.  Last night he had a presyncopal episode while urinating.    Review of Systems: He has low back pain secondary to a fall he took after becoming dizzy Pertinent positive and negative review of systems were noted in the above HPI section. All other review of systems were otherwise negative.    Current Medications, Allergies, Past Medical History, Past Surgical History, Family History and Social History were reviewed in [facility] record  Vital signs were reviewed in today's medical record. Physical Exam: General: Slightly chronically ill-appearing in no acute distress Skin: anicteric Head: Normocephalic and atraumatic Eyes:  sclerae anicteric, EOMI Ears: Normal auditory acuity Mouth: No deformity or lesions Lungs: Clear throughout to auscultation Heart: Regular rate and rhythm; no murmurs, rubs or bruits Abdomen: Soft, non tender and non distended. No masses, hepatosplenomegaly or hernias noted. Normal Bowel sounds Rectal:deferred Musculoskeletal: Symmetrical with no gross deformities  Pulses:  Normal pulses noted Extremities: No clubbing,  cyanosis, edema or deformities noted Neurological: Alert oriented x 4, grossly nonfocal Psychological:  Alert and cooperative. Normal mood and affect  See Assessment and Plan under Problem List

## 2014-06-20 NOTE — Assessment & Plan Note (Signed)
Persistent abdominal pain with accompanying weight loss, early satiety, and LFTs raise the question of an occult malignancy.  Recent ultrasound was unrevealing as were endoscopic studies.  He does have gastroparesis which may be attributed to his symptoms.  Recommendations #1 CT the abdomen and pelvis #2 check anti-nuclear antibody, antimitochondrial antibody  #3 discontinue metoclopramide #4 to consider a trial of erythromycin pending results of the above studies

## 2014-06-20 NOTE — Patient Instructions (Addendum)
You have been scheduled for a CT scan of the abdomen and pelvis at [facility] are scheduled on 06/25/2014  at  [facility] should arrive 15 minutes prior to your appointment time for registration. Please follow the written instructions below on the day of your exam:  WARNING: IF YOU ARE ALLERGIC TO IODINE/X-RAY DYE, PLEASE NOTIFY RADIOLOGY IMMEDIATELY AT [phone]! YOU WILL BE GIVEN A 13 HOUR PREMEDICATION PREP.  1) Do not eat or drink anything after 11am  (4 hours prior to your test) 2) You have been given 2 bottles of oral contrast to drink. The solution may taste               better if refrigerated, but do NOT add ice or any other liquid to this solution. Shake             well before drinking.    Drink 1 bottle of contrast @ 1pm  (2 hours prior to your exam)  Drink 1 bottle of contrast @  2pm (1 hour prior to your exam)  You may take any medications as prescribed with a small amount of water except for the following: Metformin, Glucophage, Glucovance, Avandamet, Riomet, Fortamet, Actoplus Met, Janumet, Glumetza or Metaglip. The above medications must be held the day of the exam AND 48 hours after the exam.  The purpose of you drinking the oral contrast is to aid in the visualization of your intestinal tract. The contrast solution may cause some diarrhea. Before your exam is started, you will be given a small amount of fluid to drink. Depending on your individual set of symptoms, you may also receive an intravenous injection of x-ray contrast/dye. Plan on being at [facility] for 30 minutes or long, depending on the type of exam you are having performed.  This test typically takes 30-45 minutes to complete.   Go to the basement for labs today    ________________________________________________________________________

## 2014-06-21 ENCOUNTER — Inpatient Hospital Stay (HOSPITAL_COMMUNITY)
Admission: EM | Admit: 2014-06-21 | Discharge: 2014-07-15 | DRG: 853 | Disposition: E | Payer: Commercial Managed Care - HMO | Attending: Internal Medicine | Admitting: Internal Medicine

## 2014-06-21 ENCOUNTER — Emergency Department (HOSPITAL_COMMUNITY): Payer: Commercial Managed Care - HMO

## 2014-06-21 ENCOUNTER — Encounter (HOSPITAL_COMMUNITY): Payer: Self-pay | Admitting: *Deleted

## 2014-06-21 DIAGNOSIS — K559 Vascular disorder of intestine, unspecified: Secondary | ICD-10-CM | POA: Diagnosis present

## 2014-06-21 DIAGNOSIS — J9601 Acute respiratory failure with hypoxia: Secondary | ICD-10-CM | POA: Diagnosis not present

## 2014-06-21 DIAGNOSIS — N1330 Unspecified hydronephrosis: Secondary | ICD-10-CM | POA: Diagnosis present

## 2014-06-21 DIAGNOSIS — K859 Acute pancreatitis without necrosis or infection, unspecified: Secondary | ICD-10-CM

## 2014-06-21 DIAGNOSIS — F329 Major depressive disorder, single episode, unspecified: Secondary | ICD-10-CM | POA: Diagnosis present

## 2014-06-21 DIAGNOSIS — Z0189 Encounter for other specified special examinations: Secondary | ICD-10-CM

## 2014-06-21 DIAGNOSIS — K6289 Other specified diseases of anus and rectum: Secondary | ICD-10-CM | POA: Diagnosis present

## 2014-06-21 DIAGNOSIS — I4891 Unspecified atrial fibrillation: Secondary | ICD-10-CM | POA: Diagnosis present

## 2014-06-21 DIAGNOSIS — E785 Hyperlipidemia, unspecified: Secondary | ICD-10-CM | POA: Diagnosis present

## 2014-06-21 DIAGNOSIS — I7100 Dissection of unspecified site of aorta: Secondary | ICD-10-CM

## 2014-06-21 DIAGNOSIS — G8929 Other chronic pain: Secondary | ICD-10-CM | POA: Diagnosis present

## 2014-06-21 DIAGNOSIS — Z7722 Contact with and (suspected) exposure to environmental tobacco smoke (acute) (chronic): Secondary | ICD-10-CM | POA: Diagnosis present

## 2014-06-21 DIAGNOSIS — K760 Fatty (change of) liver, not elsewhere classified: Secondary | ICD-10-CM | POA: Diagnosis present

## 2014-06-21 DIAGNOSIS — K219 Gastro-esophageal reflux disease without esophagitis: Secondary | ICD-10-CM | POA: Diagnosis present

## 2014-06-21 DIAGNOSIS — Z8042 Family history of malignant neoplasm of prostate: Secondary | ICD-10-CM | POA: Diagnosis not present

## 2014-06-21 DIAGNOSIS — J9690 Respiratory failure, unspecified, unspecified whether with hypoxia or hypercapnia: Secondary | ICD-10-CM

## 2014-06-21 DIAGNOSIS — N401 Enlarged prostate with lower urinary tract symptoms: Secondary | ICD-10-CM | POA: Diagnosis present

## 2014-06-21 DIAGNOSIS — D65 Disseminated intravascular coagulation [defibrination syndrome]: Secondary | ICD-10-CM | POA: Diagnosis present

## 2014-06-21 DIAGNOSIS — Z66 Do not resuscitate: Secondary | ICD-10-CM | POA: Diagnosis not present

## 2014-06-21 DIAGNOSIS — E872 Acidosis, unspecified: Secondary | ICD-10-CM

## 2014-06-21 DIAGNOSIS — N170 Acute kidney failure with tubular necrosis: Secondary | ICD-10-CM | POA: Diagnosis not present

## 2014-06-21 DIAGNOSIS — R34 Anuria and oliguria: Secondary | ICD-10-CM | POA: Diagnosis present

## 2014-06-21 DIAGNOSIS — N184 Chronic kidney disease, stage 4 (severe): Secondary | ICD-10-CM | POA: Diagnosis present

## 2014-06-21 DIAGNOSIS — J9600 Acute respiratory failure, unspecified whether with hypoxia or hypercapnia: Secondary | ICD-10-CM | POA: Diagnosis not present

## 2014-06-21 DIAGNOSIS — N281 Cyst of kidney, acquired: Secondary | ICD-10-CM | POA: Diagnosis present

## 2014-06-21 DIAGNOSIS — Z4659 Encounter for fitting and adjustment of other gastrointestinal appliance and device: Secondary | ICD-10-CM

## 2014-06-21 DIAGNOSIS — R7309 Other abnormal glucose: Secondary | ICD-10-CM | POA: Diagnosis present

## 2014-06-21 DIAGNOSIS — Z452 Encounter for adjustment and management of vascular access device: Secondary | ICD-10-CM

## 2014-06-21 DIAGNOSIS — K3184 Gastroparesis: Secondary | ICD-10-CM | POA: Diagnosis present

## 2014-06-21 DIAGNOSIS — D649 Anemia, unspecified: Secondary | ICD-10-CM | POA: Diagnosis present

## 2014-06-21 DIAGNOSIS — R579 Shock, unspecified: Secondary | ICD-10-CM

## 2014-06-21 DIAGNOSIS — E11649 Type 2 diabetes mellitus with hypoglycemia without coma: Secondary | ICD-10-CM | POA: Diagnosis present

## 2014-06-21 DIAGNOSIS — R109 Unspecified abdominal pain: Secondary | ICD-10-CM

## 2014-06-21 DIAGNOSIS — E875 Hyperkalemia: Secondary | ICD-10-CM | POA: Diagnosis present

## 2014-06-21 DIAGNOSIS — Z800 Family history of malignant neoplasm of digestive organs: Secondary | ICD-10-CM | POA: Diagnosis not present

## 2014-06-21 DIAGNOSIS — R680 Hypothermia, not associated with low environmental temperature: Secondary | ICD-10-CM | POA: Diagnosis present

## 2014-06-21 DIAGNOSIS — K625 Hemorrhage of anus and rectum: Secondary | ICD-10-CM | POA: Diagnosis not present

## 2014-06-21 DIAGNOSIS — R6521 Severe sepsis with septic shock: Secondary | ICD-10-CM | POA: Diagnosis present

## 2014-06-21 DIAGNOSIS — Z79899 Other long term (current) drug therapy: Secondary | ICD-10-CM | POA: Diagnosis not present

## 2014-06-21 DIAGNOSIS — N138 Other obstructive and reflux uropathy: Secondary | ICD-10-CM | POA: Diagnosis present

## 2014-06-21 DIAGNOSIS — R55 Syncope and collapse: Secondary | ICD-10-CM

## 2014-06-21 DIAGNOSIS — J90 Pleural effusion, not elsewhere classified: Secondary | ICD-10-CM

## 2014-06-21 DIAGNOSIS — K850 Idiopathic acute pancreatitis: Secondary | ICD-10-CM | POA: Diagnosis not present

## 2014-06-21 DIAGNOSIS — I129 Hypertensive chronic kidney disease with stage 1 through stage 4 chronic kidney disease, or unspecified chronic kidney disease: Secondary | ICD-10-CM | POA: Diagnosis present

## 2014-06-21 DIAGNOSIS — K7200 Acute and subacute hepatic failure without coma: Secondary | ICD-10-CM | POA: Diagnosis present

## 2014-06-21 DIAGNOSIS — A419 Sepsis, unspecified organism: Secondary | ICD-10-CM | POA: Diagnosis present

## 2014-06-21 DIAGNOSIS — N179 Acute kidney failure, unspecified: Secondary | ICD-10-CM | POA: Diagnosis not present

## 2014-06-21 DIAGNOSIS — R932 Abnormal findings on diagnostic imaging of liver and biliary tract: Secondary | ICD-10-CM

## 2014-06-21 LAB — I-STAT ARTERIAL BLOOD GAS, ED
ACID-BASE DEFICIT: 25 mmol/L — AB (ref 0.0–2.0)
ACID-BASE DEFICIT: 27 mmol/L — AB (ref 0.0–2.0)
Acid-base deficit: 25 mmol/L — ABNORMAL HIGH (ref 0.0–2.0)
BICARBONATE: 3.2 meq/L — AB (ref 20.0–24.0)
BICARBONATE: 4.5 meq/L — AB (ref 20.0–24.0)
Bicarbonate: 4.5 mEq/L — ABNORMAL LOW (ref 20.0–24.0)
O2 SAT: 98 %
O2 Saturation: 99 %
O2 Saturation: 98 %
PCO2 ART: 12.2 mmHg — AB (ref 35.0–45.0)
PCO2 ART: 21 mmHg — AB (ref 35.0–45.0)
PH ART: 6.989 — AB (ref 7.350–7.450)
PO2 ART: 192 mmHg — AB (ref 80.0–100.0)
Patient temperature: 98.6
Patient temperature: 98.6
TCO2: <5 mmol/L (ref 0–100)
TCO2: 5 mmol/L (ref 0–100)
TCO2: 5 mmol/L (ref 0–100)
pCO2 arterial: 18.5 mmHg — CL (ref 35.0–45.0)
pH, Arterial: 7.034 — CL (ref 7.350–7.450)
pH, Arterial: 6.939 — CL (ref 7.350–7.450)
pO2, Arterial: 174 mmHg — ABNORMAL HIGH (ref 80.0–100.0)
pO2, Arterial: 160 mmHg — ABNORMAL HIGH (ref 80.0–100.0)

## 2014-06-21 LAB — CBC WITH DIFFERENTIAL/PLATELET
BASOS PCT: 0 % (ref 0–1)
Basophils Absolute: 0 10*3/uL (ref 0.0–0.1)
Basophils Absolute: 0 10*3/uL (ref 0.0–0.1)
Basophils Relative: 0 % (ref 0–1)
EOS ABS: 0 10*3/uL (ref 0.0–0.7)
Eosinophils Absolute: 0 10*3/uL (ref 0.0–0.7)
Eosinophils Relative: 0 % (ref 0–5)
Eosinophils Relative: 0 % (ref 0–5)
HCT: 42.6 % (ref 39.0–52.0)
HCT: 33.1 % — ABNORMAL LOW (ref 39.0–52.0)
HEMOGLOBIN: 13.9 g/dL (ref 13.0–17.0)
HEMOGLOBIN: 11.2 g/dL — AB (ref 13.0–17.0)
LYMPHS ABS: 1.5 10*3/uL (ref 0.7–4.0)
LYMPHS PCT: 6 % — AB (ref 12–46)
LYMPHS PCT: 9 % — AB (ref 12–46)
Lymphs Abs: 1.5 10*3/uL (ref 0.7–4.0)
MCH: 33.3 pg (ref 26.0–34.0)
MCH: 33.2 pg (ref 26.0–34.0)
MCHC: 32.6 g/dL (ref 30.0–36.0)
MCHC: 33.8 g/dL (ref 30.0–36.0)
MCV: 102.2 fL — AB (ref 78.0–100.0)
MCV: 98.2 fL (ref 78.0–100.0)
MONOS PCT: 5 % (ref 3–12)
Monocytes Absolute: 1.7 10*3/uL — ABNORMAL HIGH (ref 0.1–1.0)
Monocytes Absolute: 0.8 10*3/uL (ref 0.1–1.0)
Monocytes Relative: 7 % (ref 3–12)
NEUTROS ABS: 15 10*3/uL — AB (ref 1.7–7.7)
NEUTROS PCT: 86 % — AB (ref 43–77)
Neutro Abs: 21.5 10*3/uL — ABNORMAL HIGH (ref 1.7–7.7)
Neutrophils Relative %: 87 % — ABNORMAL HIGH (ref 43–77)
PLATELETS: 104 10*3/uL — AB (ref 150–400)
Platelets: 74 10*3/uL — ABNORMAL LOW (ref 150–400)
RBC: 4.17 MIL/uL — ABNORMAL LOW (ref 4.22–5.81)
RBC: 3.37 MIL/uL — ABNORMAL LOW (ref 4.22–5.81)
RDW: 16.4 % — ABNORMAL HIGH (ref 11.5–15.5)
RDW: 16.7 % — ABNORMAL HIGH (ref 11.5–15.5)
WBC: 24.7 10*3/uL — AB (ref 4.0–10.5)
WBC: 17.4 10*3/uL — AB (ref 4.0–10.5)

## 2014-06-21 LAB — COMPREHENSIVE METABOLIC PANEL
ALBUMIN: 2.5 g/dL — AB (ref 3.5–5.2)
ALBUMIN: 1.8 g/dL — AB (ref 3.5–5.2)
ALK PHOS: 81 U/L (ref 39–117)
ALT: 228 U/L — ABNORMAL HIGH (ref 0–53)
ALT: 163 U/L — AB (ref 0–53)
AST: 191 U/L — AB (ref 0–37)
AST: 175 U/L — ABNORMAL HIGH (ref 0–37)
Alkaline Phosphatase: 70 U/L (ref 39–117)
Anion gap: 30 — ABNORMAL HIGH (ref 5–15)
Anion gap: 23 — ABNORMAL HIGH (ref 5–15)
BILIRUBIN TOTAL: 4.4 mg/dL — AB (ref 0.3–1.2)
BUN: 52 mg/dL — ABNORMAL HIGH (ref 6–23)
BUN: 39 mg/dL — ABNORMAL HIGH (ref 6–23)
CALCIUM: 8.1 mg/dL — AB (ref 8.4–10.5)
CALCIUM: 6 mg/dL — AB (ref 8.4–10.5)
CHLORIDE: 103 mmol/L (ref 96–112)
CO2: 7 mmol/L — CL (ref 19–32)
CO2: 15 mmol/L — ABNORMAL LOW (ref 19–32)
CREATININE: 4.15 mg/dL — AB (ref 0.50–1.35)
CREATININE: 3.16 mg/dL — AB (ref 0.50–1.35)
Chloride: 103 mmol/L (ref 96–112)
GFR calc Af Amer: 22 mL/min — ABNORMAL LOW (ref >90)
GFR calc non Af Amer: 14 mL/min — ABNORMAL LOW (ref >90)
GFR, EST AFRICAN AMERICAN: 16 mL/min — AB (ref >90)
GFR, EST NON AFRICAN AMERICAN: 19 mL/min — AB (ref >90)
GLUCOSE: 118 mg/dL — AB (ref 70–99)
Glucose, Bld: 415 mg/dL — ABNORMAL HIGH (ref 70–99)
Potassium: 5.4 mmol/L — ABNORMAL HIGH (ref 3.5–5.1)
Potassium: 5.4 mmol/L — ABNORMAL HIGH (ref 3.5–5.1)
SODIUM: 141 mmol/L (ref 135–145)
Sodium: 140 mmol/L (ref 135–145)
Total Bilirubin: 3.6 mg/dL — ABNORMAL HIGH (ref 0.3–1.2)
Total Protein: 4.2 g/dL — ABNORMAL LOW (ref 6.0–8.3)
Total Protein: 3.4 g/dL — ABNORMAL LOW (ref 6.0–8.3)

## 2014-06-21 LAB — CBG MONITORING, ED
GLUCOSE-CAPILLARY: 98 mg/dL (ref 70–99)
Glucose-Capillary: 61 mg/dL — ABNORMAL LOW (ref 70–99)
Glucose-Capillary: 65 mg/dL — ABNORMAL LOW (ref 70–99)
Glucose-Capillary: 110 mg/dL — ABNORMAL HIGH (ref 70–99)

## 2014-06-21 LAB — URINALYSIS, ROUTINE W REFLEX MICROSCOPIC
GLUCOSE, UA: NEGATIVE mg/dL
Ketones, ur: 15 mg/dL — AB
Nitrite: NEGATIVE
Protein, ur: 30 mg/dL — AB
SPECIFIC GRAVITY, URINE: 1.029 (ref 1.005–1.030)
Urobilinogen, UA: 1 mg/dL (ref 0.0–1.0)
pH: 5 (ref 5.0–8.0)

## 2014-06-21 LAB — I-STAT CG4 LACTIC ACID, ED
Lactic Acid, Venous: >17 mmol/L (ref 0.5–2.0)
Lactic Acid, Venous: >17 mmol/L (ref 0.5–2.0)

## 2014-06-21 LAB — I-STAT CHEM 8, ED
BUN: 48 mg/dL — AB (ref 6–23)
CALCIUM ION: 0.98 mmol/L — AB (ref 1.13–1.30)
CHLORIDE: 104 mmol/L (ref 96–112)
Creatinine, Ser: 3.9 mg/dL — ABNORMAL HIGH (ref 0.50–1.35)
Glucose, Bld: 107 mg/dL — ABNORMAL HIGH (ref 70–99)
HEMATOCRIT: 48 % (ref 39.0–52.0)
HEMOGLOBIN: 16.3 g/dL (ref 13.0–17.0)
Potassium: 5 mmol/L (ref 3.5–5.1)
Sodium: 139 mmol/L (ref 135–145)
TCO2: 6 mmol/L (ref 0–100)

## 2014-06-21 LAB — LIPID PANEL
CHOL/HDL RATIO: 11.8 ratio
CHOLESTEROL: 59 mg/dL (ref 0–200)
HDL: 5 mg/dL — AB (ref >39)
LDL CALC: 29 mg/dL (ref 0–99)
TRIGLYCERIDES: 126 mg/dL (ref <150)
VLDL: 25 mg/dL (ref 0–40)

## 2014-06-21 LAB — URINE MICROSCOPIC-ADD ON

## 2014-06-21 LAB — PROTIME-INR
INR: 2.34 — ABNORMAL HIGH (ref 0.00–1.49)
INR: 2.57 — AB (ref 0.00–1.49)
PROTHROMBIN TIME: 25.9 s — AB (ref 11.6–15.2)
Prothrombin Time: 27.8 seconds — ABNORMAL HIGH (ref 11.6–15.2)

## 2014-06-21 LAB — CK TOTAL AND CKMB (NOT AT ARMC)
CK TOTAL: 359 U/L — AB (ref 7–232)
CK, MB: 14.5 ng/mL (ref 0.3–4.0)
Relative Index: 4 — ABNORMAL HIGH (ref 0.0–2.5)

## 2014-06-21 LAB — I-STAT TROPONIN, ED: Troponin i, poc: 0.02 ng/mL (ref 0.00–0.08)

## 2014-06-21 LAB — STREP PNEUMONIAE URINARY ANTIGEN: Strep Pneumo Urinary Antigen: NEGATIVE

## 2014-06-21 LAB — PROCALCITONIN: PROCALCITONIN: 4.67 ng/mL

## 2014-06-21 LAB — MAGNESIUM: Magnesium: 1.5 mg/dL (ref 1.5–2.5)

## 2014-06-21 LAB — PHOSPHORUS: Phosphorus: 7 mg/dL — ABNORMAL HIGH (ref 2.3–4.6)

## 2014-06-21 LAB — AMYLASE: AMYLASE: 723 U/L — AB (ref 0–105)

## 2014-06-21 LAB — TROPONIN I: Troponin I: 0.05 ng/mL — ABNORMAL HIGH (ref <0.031)

## 2014-06-21 LAB — LACTIC ACID, PLASMA: LACTIC ACID, VENOUS: 16.8 mmol/L — AB (ref 0.5–2.0)

## 2014-06-21 LAB — APTT: aPTT: 66 seconds — ABNORMAL HIGH (ref 24–37)

## 2014-06-21 LAB — LIPASE, BLOOD: Lipase: 359 U/L — ABNORMAL HIGH (ref 11–59)

## 2014-06-21 LAB — BRAIN NATRIURETIC PEPTIDE: B Natriuretic Peptide: 103.8 pg/mL — ABNORMAL HIGH (ref 0.0–100.0)

## 2014-06-21 LAB — ABO/RH: ABO/RH(D): A POS

## 2014-06-21 MED ORDER — PIPERACILLIN-TAZOBACTAM IN DEX 2-0.25 GM/50ML IV SOLN
2.2500 g | Freq: Four times a day (QID) | INTRAVENOUS | Status: DC
  Administered 2014-06-22 (×4): 2.25 g via INTRAVENOUS
  Filled 2014-06-21 (×6): qty 50

## 2014-06-21 MED ORDER — SODIUM CHLORIDE 0.9 % IV SOLN: 250.0000 mL | INTRAVENOUS | Status: DC | PRN

## 2014-06-21 MED ORDER — ROCURONIUM BROMIDE 50 MG/5ML IV SOLN
100.0000 mg | Freq: Once | INTRAVENOUS | Status: AC
  Administered 2014-06-21: 100 mg via INTRAVENOUS

## 2014-06-21 MED ORDER — SODIUM BICARBONATE 8.4 % IV SOLN: 100.0000 meq | Freq: Once | INTRAVENOUS | Status: DC

## 2014-06-21 MED ORDER — PROPOFOL 10 MG/ML IV EMUL
5.0000 ug/kg/min | Freq: Once | INTRAVENOUS | Status: AC
  Administered 2014-06-21: 10 ug/kg/min via INTRAVENOUS
  Filled 2014-06-21: qty 100

## 2014-06-21 MED ORDER — SODIUM CHLORIDE 0.9 % IV SOLN
1500.0000 mg | Freq: Once | INTRAVENOUS | Status: AC
  Administered 2014-06-21: 1500 mg via INTRAVENOUS
  Filled 2014-06-21 (×2): qty 1500

## 2014-06-21 MED ORDER — DEXTROSE 50 % IV SOLN
INTRAVENOUS | Status: AC
  Filled 2014-06-21: qty 50

## 2014-06-21 MED ORDER — TETANUS-DIPHTH-ACELL PERTUSSIS 5-2.5-18.5 LF-MCG/0.5 IM SUSP
0.5000 mL | Freq: Once | INTRAMUSCULAR | Status: AC
  Administered 2014-06-21: 0.5 mL via INTRAMUSCULAR
  Filled 2014-06-21: qty 0.5

## 2014-06-21 MED ORDER — SODIUM CHLORIDE 0.9 % IV BOLUS (SEPSIS)
2000.0000 mL | Freq: Once | INTRAVENOUS | Status: AC
  Administered 2014-06-21: 2000 mL via INTRAVENOUS

## 2014-06-21 MED ORDER — IOHEXOL 350 MG/ML SOLN: 100.0000 mL | Freq: Once | INTRAVENOUS | Status: AC | PRN

## 2014-06-21 MED ORDER — PANTOPRAZOLE SODIUM 40 MG IV SOLR
40.0000 mg | Freq: Every day | INTRAVENOUS | Status: DC
  Administered 2014-06-21 – 2014-06-25 (×5): 40 mg via INTRAVENOUS
  Filled 2014-06-21 (×8): qty 40

## 2014-06-21 MED ORDER — PIPERACILLIN-TAZOBACTAM 3.375 G IVPB 30 MIN: 3.3750 g | Freq: Once | INTRAVENOUS | Status: DC

## 2014-06-21 MED ORDER — SODIUM CHLORIDE 0.9 % IV SOLN
INTRAVENOUS | Status: DC
  Administered 2014-06-21 – 2014-06-22 (×3): via INTRAVENOUS

## 2014-06-21 MED ORDER — SODIUM CHLORIDE 0.9 % IV SOLN
Freq: Once | INTRAVENOUS | Status: AC
  Administered 2014-06-22: via INTRAVENOUS

## 2014-06-21 MED ORDER — ETOMIDATE 2 MG/ML IV SOLN
30.0000 mg | Freq: Once | INTRAVENOUS | Status: AC
  Administered 2014-06-21: 30 mg via INTRAVENOUS

## 2014-06-21 MED ORDER — SODIUM BICARBONATE 8.4 % IV SOLN
INTRAVENOUS | Status: DC
  Administered 2014-06-21 – 2014-06-25 (×11): via INTRAVENOUS
  Filled 2014-06-21 (×25): qty 150

## 2014-06-21 MED ORDER — DEXTROSE 50 % IV SOLN
1.0000 | Freq: Once | INTRAVENOUS | Status: AC
  Administered 2014-06-21: 50 mL via INTRAVENOUS

## 2014-06-21 MED ORDER — PIPERACILLIN-TAZOBACTAM 3.375 G IVPB 30 MIN
3.3750 g | Freq: Once | INTRAVENOUS | Status: AC
  Administered 2014-06-21: 3.375 g via INTRAVENOUS
  Filled 2014-06-21: qty 50

## 2014-06-21 NOTE — Progress Notes (Signed)
Patient placed on NIV via Servo I 20/5 60% patient improved and tolerating well.

## 2014-06-21 NOTE — ED Notes (Signed)
Abnormal labs given to Dr. Colin Rhein

## 2014-06-21 NOTE — ED Notes (Signed)
Pt intubated at present

## 2014-06-21 NOTE — Progress Notes (Signed)
Increased rate to 34 BPM due to ABG results, CCM aware.

## 2014-06-21 NOTE — H&P (Signed)
PULMONARY / CRITICAL CARE MEDICINE   Name: [name] MRN: [id number] DOB: 1950-05-20   PCP Unk Pinto DAVID, MD [facility] - Dr Alben Spittle [facility] pum - [facility] - last seen may 2012  ADMISSION DATE:  06/22/2014 CONSULTATION DATE:  06/19/2014   REFERRING MD :  ER at [facility]  CHIEF COMPLAINT:  Acute abd pain, lactic acidosis 17 . Mottled intubated  SIGNIFICANT EVENTS: 06/16/2014 - admit, intubated and left IJ at [facility]   HISTORY OF PRESENT ILLNESS:  64 year old male  With bp, dm, lipid, pre-dm and vitamin d def, with no family locally (brother in [place]) - hx from ER RN/MD. 5d hx of abdominal pain, weakness and falling. EMS found him hypoglycemic and hypothermic. In ER tachypneic and mottled needing intubation. Never dropped bP but lactic acidosis of 17 despite fluids and without clearance. PCCM Asked to admit. 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 Other findings include emphysema and non obstructive hydronephrosis  2012 eval for dyspnea  - Ex heavy passive smoker. Has chronic dyspnea on exertion with hyooxemia at rest. Dysnea is at class 2-3 level. On walking he does not sem to desaturate ad in fact pulse ox picked up suggesting underlying atelectasis related to raised left diaphragm. . 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 CPST test 05/24/2010 is normal but for mild reduction in HRresponse due to beta blocker and mild elevation in dead space possibly rlated to the left diaprhagm issue. There was no evidence of exercise induced bronchospasm on CPST. He comenced pulmonary rehab in March 2012 andd by May 2012 he had improved significantly with pulmnary rehab. This suggests deconditioning and weight issues and diaphragm as main causes of dyspnea  PAST MEDICAL HISTORY :   has a past medical history of Hyperlipidemia; HTN (hypertension);  Allergic rhinitis; Headache(784.0); Nocturia; Urinary retention; Foley catheter in place; UTI (urinary tract infection); GERD (gastroesophageal reflux disease); Prediabetes; Depression; Arthritis; and Bell's palsy.  has past surgical history that includes Hernia repair; Nissen fundoplication; Laparoscopic lysis intestinal adhesions; Transurethral resection of prostate (N/A, 03/20/2013); Carpal tunnel release; and Nerve Surgery. Prior to Admission medications   Medication Sig Start Date End Date Taking? Authorizing Provider  atenolol (TENORMIN) 100 MG tablet Take 1 tablet (100 mg total) by mouth daily. 07/29/13 07/29/14 Yes Unk Pinto, MD  atorvastatin (LIPITOR) 80 MG tablet Take 1 tablet (80 mg total) by mouth 3 (three) times a week. Patient taking differently: Take 40-80 mg by mouth 3 (three) times a week.  07/29/13  Yes Unk Pinto, MD  finasteride (PROSCAR) 5 MG tablet Take 5 mg by mouth daily.  07/30/13  Yes Historical Provider, MD  hyoscyamine (LEVSIN, ANASPAZ) 0.125 MG tablet Take 0.125 mg by mouth every 4 (four) hours as needed for bladder spasms.   Yes Historical Provider, MD  meloxicam (MOBIC) 15 MG tablet 1/2 to 1 tablet daily with food for arthritis pain and inflammation Patient taking differently: Take 7.5-15 mg by mouth daily. Take  with food for arthritis pain and inflammation 08/07/13  Yes Unk Pinto, MD  metoCLOPramide (REGLAN) 10 MG tablet Take 1 tablet (10 mg total) by mouth 4 (four) times daily -  before meals and at bedtime. 05/20/14  Yes Inda Castle, MD  omeprazole (PRILOSEC OTC) 20 MG tablet Take 20 mg by mouth daily.   Yes Historical Provider, MD  omeprazole (PRILOSEC) 40 MG capsule  Take 1 capsule (40 mg total) by mouth daily. 05/22/14  Yes Vicie Mutters, PA-C  oxyCODONE-acetaminophen (PERCOCET/ROXICET) 5-325 MG per tablet Take 1-2 tablets by mouth every 4 (four) hours as needed for severe pain.   Yes Historical Provider, MD  tamsulosin (FLOMAX) 0.4 MG CAPS capsule Take  0.4 mg by mouth at bedtime.    Yes Historical Provider, MD  B Complex-C (SUPER B COMPLEX PO) Take 1 tablet by mouth every morning.    Historical Provider, MD  Cholecalciferol (VITAMIN D PO) Take 2,000 Int'l Units by mouth daily.    Historical Provider, MD  cyclobenzaprine (FLEXERIL) 10 MG tablet Take 10 mg by mouth 3 (three) times daily as needed for muscle spasms.    Historical Provider, MD  Magnesium 500 MG TABS Take 1 tablet by mouth daily.    Historical Provider, MD  Multiple Vitamin (MULTIVITAMIN WITH MINERALS) TABS tablet Take 1 tablet by mouth every morning.     Historical Provider, MD  omega-3 acid ethyl esters (LOVAZA) 1 G capsule Take by mouth 2 (two) times daily.    Historical Provider, MD   Allergies  Allergen Reactions  . Chloraseptic Sore Throat [Acetaminophen] Other (See Comments)    Spray burnt mouth!  . Milk-Related Compounds Other (See Comments)    Causes gas and diarrhea    FAMILY HISTORY:  indicated that his mother is deceased. He indicated that his father is deceased. He indicated that his brother is alive.  SOCIAL HISTORY:  reports that he has been passively smoking.  He quit smokeless tobacco use about 36 years ago. His smokeless tobacco use included Chew. He reports that he does not drink alcohol or use illicit drugs.  REVIEW OF SYSTEMS:  Not elicitable due to intubated status  SUBJECTIVE:   VITAL SIGNS: Pulse Rate:  [78-108] 78 (02/06 1730) Resp:  [23-42] 24 (02/06 1800) BP: (52-138)/(38-93) 138/73 mmHg (02/06 1800) SpO2:  [100 %] 100 % (02/06 1805) FiO2 (%):  [40 %] 40 % (02/06 1805) HEMODYNAMICS:   VENTILATOR SETTINGS: Vent Mode:  [-] PRVC FiO2 (%):  [40 %] 40 % Set Rate:  [24 bmp] 24 bmp Vt Set:  [650 mL] 650 mL PEEP:  [5 cmH20] 5 cmH20 Plateau Pressure:  [15 cmH20] 15 cmH20 INTAKE / OUTPUT: No intake or output data in the 24 hours ending 07/12/2014 1833  PHYSICAL EXAMINATION: General:  Well built male. Intubated. Looks criticallill Neuro:  Post  intubation  Sedated. RASS -3 HEENT:  ET tube + Cardiovascular:  Tachycardic. Normal heart sounds Lungs:  Sync with vent. CTA bilaterally Abdomen:  Soft, No mass. Sluuggish bowel sounds. No rigidity Musculoskeletal:  No cyanosis, no clubbing,. No edema Skin:  Mottled in forearms and feet, knees look bruised  LABS: PULMONARY  Recent Labs Lab 07/07/2014 1455 06/24/2014 1503 06/18/2014 1822  PHART 7.034*  --  6.939*  PCO2ART 12.2*  --  21.0*  PO2ART 192.0*  --  174.0*  HCO3 3.2*  --  4.5*  TCO2 5 6 5   O2SAT 99.0  --  98.0    CBC  Recent Labs Lab 06/20/14 1230 06/17/2014 1503 07/02/2014 1600  HGB 15.3 16.3 13.9  HCT 44.8 48.0 42.6  WBC 13.4*  --  24.7*  PLT 101.0*  --  104*    COAGULATION  Recent Labs Lab 06/20/14 1230 07/04/2014 1445  INR 1.4* 2.34*    CARDIAC  No results for input(s): TROPONINI in the last 168 hours. No results for input(s): PROBNP in the last 168 hours.  CHEMISTRY  Recent Labs Lab 06/20/14 1230 06/19/2014 1445 07/05/2014 1503  NA 137 140 139  K 5.1 5.4* 5.0  CL 103 103 104  CO2 25 7*  --   GLUCOSE 102* 118* 107*  BUN 34* 52* 48*  CREATININE 2.02* 4.15* 3.90*  CALCIUM 8.9 8.1*  --    Estimated Creatinine Clearance: 20.7 mL/min (by C-G formula based on Cr of 3.9).   LIVER  Recent Labs Lab 06/20/14 1230 07/12/2014 1445  AST 192*  192* 191*  ALT 312*  312* 228*  ALKPHOS 80  80 81  BILITOT 5.3*  5.3* 4.4*  PROT 5.3*  5.3* 4.2*  ALBUMIN 3.1*  3.1* 2.5*  INR 1.4* 2.34*     INFECTIOUS  Recent Labs Lab 07/06/2014 1503 06/22/2014 1754  LATICACIDVEN >17.00* >17.00*     ENDOCRINE CBG (last 3)   Recent Labs  07/05/2014 1448 06/25/2014 1608  GLUCAP 61* 65*         IMAGING x48h Dg Chest Portable 1 View  07/03/2014   CLINICAL DATA:  Trauma patient. Loss of consciousness following 2x earlier today. Patient now complaining of abdominal, back and bilateral leg pain.  EXAM: PORTABLE CHEST - 1 VIEW  COMPARISON:  03/18/2013   FINDINGS: Apparent enlargement of the cardiac silhouette and mediastinal contours, in particular, there is apparent enlargement of the ascending and descending thoracic aorta. No focal airspace opacities. No pleural effusion or pneumothorax. No evidence of edema. No acute osseus abnormalities.  IMPRESSION: Apparent enlargement of the cardiac silhouette and mediastinal contours, possibly accentuated due to decreased lung volumes and patient rotation though a thoracic aortic aneurysm and/or dissection could have a similar appearance. Further evaluation could be performed with PA and lateral chest radiograph and/or chest CTA as clinically indicated.  Critical Value/emergent results were called by telephone at the time of interpretation on 06/25/2014 at 3:13 pm to Dr. Debby Freiberg , who verbally acknowledged these results.   Electronically Signed   By: Sandi Mariscal M.D.   On: 06/22/2014 15:15   Ct Angio Chest Aorta W/cm &/or Wo/cm  06/24/2014   CLINICAL DATA:  At least 2 unwitnessed falls at home earlier today. Decreasing a blood pressure while in the emergency department. Acute onset abdominal pain, lumbar back pain and bilateral lower extremity pain. Surgical history includes Nissen fundoplication, light cysts of intestinal adhesions, and TURP.  EXAM: CT ANGIOGRAPHY CHEST, ABDOMEN AND PELVIS  TECHNIQUE: Multidetector CT imaging through the chest, abdomen and pelvis was performed using the standard protocol before and during bolus administration of intravenous contrast. Multiplanar reconstructed images and MIPs were obtained and reviewed to evaluate the vascular anatomy.  CONTRAST:  100 ml Omnipaque 350 IV.  It is noted that the patient has an elevated BUN of 48 and creatinine of 3.90. However, the emergency department physician felt that the current symptoms warranted the use of intravenous iodinated contrast in order to exclude an aortic dissection.  COMPARISON:  CT abdomen and pelvis 02/13/2013, 01/03/2013,  04/28/2008. CT chest 04/28/2008.  FINDINGS: CTA CHEST FINDINGS  Respiratory motion blurs images throughout the examination of the chest, rendering the examination less than optimal. Unenhanced images demonstrate no evidence of mural hematoma involving the thoracic aorta. Enhanced images demonstrate no evidence of dissection or aneurysm. Mild atherosclerosis is present involving the descending thoracic aorta. Proximal great vessels widely patent.  Heart size normal, though there may be mild left atrial enlargement. No visible coronary atherosclerosis. No pericardial effusion. Enlarged main pulmonary arteries, the right measuring up to  3.5 cm approximately diameter and the left up to approximately 3.0 cm diameter. No convincing evidence of central pulmonary embolism, though the timing of contrast enhancement was not optimal for visualization of the pulmonary arteries.  Emphysematous changes throughout both lungs. Pulmonary parenchyma clear without localized airspace consolidation, interstitial disease, or parenchymal nodules or masses. No pleural effusions. Central airways patent.  No significant mediastinal, hilar or axillary lymphadenopathy. Thyroid gland normal in size containing numerous small nodules, the largest approximating 8 mm. Fluid throughout the esophagus. Small hiatal hernia which will be discussed below.  Bone window images demonstrate degenerative disc disease and spondylosis involving the lower cervical spine with sclerosis involving the C7 vertebral body. No sclerotic vertebrae elsewhere in the visualized lower cervical were thoracic spine. Mild osseous demineralization is suspected.  Review of the MIP images confirms the above findings.  CTA ABDOMEN AND PELVIS FINDINGS  Respiratory motion blurs many of the images of the abdomen, particularly the upper abdomen, rendering the examination less than optimal. Unenhanced images demonstrate no evidence of mural thrombus involving the upper abdominal aorta.  Enhanced images demonstrate mild atherosclerosis involving the distal abdominal aorta without evidence of aneurysm or dissection. Mild left common iliac atherosclerosis. Widely patent celiac artery, SMA, and IMA. Widely patent single renal arteries bilaterally.  There is a round collection of contrast adjacent to or involving the wall of the distal esophagus or gastric cardia at the EG junction, best seen on the coronal reformatted images, measuring approximately 1.9 x 1.3 cm. This is immediately adjacent to surgical clips placed at the time of the prior Nissen procedure. Small to moderate sized recurrent hiatal hernia. Fluid-filled stomach which is not distended.  Severe diffuse hepatic steatosis. The liver is suboptimally imaged due to the respiratory motion, but no focal parenchymal abnormality is identified. Similarly, the spleen is unremarkable. I suspect diffuse enlargement of the pancreas and associated peripancreatic edema/inflammation. There is also edema/inflammation surrounding the descending portion of the duodenum.  Normal adrenal glands. Hydroureteronephrosis involving both kidneys without evidence of obstructing stone or mass. Severe scarring involving both kidneys. Patchy enhancement the kidneys consistent with the renal insufficiency. Cortical cysts involving both kidneys. No visible solid renal masses. No significant lymphadenopathy.  The remainder the small bowel is unremarkable. Moderate stool burden in the ascending and transverse colon, with borderline gaseous distension of the transverse colon. Descending colon, sigmoid colon and rectum relatively decompressed. Wall thickening over a several cm segment of the distal rectum to the anal verge. No free intraperitoneal air. Small amount of ascites dependently in the pelvis.  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 No visible masses at either inferior orifice to account for the bilateral hydronephrosis. TURP  defect in the prostate gland. Phleboliths low in both sides of the pelvis.  Bone window images demonstrate degenerative disc disease and spondylosis at L1-2 and L2-3, facet degenerative changes at L4-5 and L5-S1, and bone islands in the right iliac bone.  Review of the MIP images confirms the above findings.  IMPRESSION: 1. The examination was significantly degraded by respiratory motion. 2. No evidence of thoracic or abdominal aortic aneurysm or dissection. 3. 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 4. Acute pancreatitis is suspected. 5. Acute duodenitis is also suspected. This may be secondary to the presumed pancreatitis. 6. Proctitis involving the distal rectum to the level of the anal verge. This  likely explains the bright red blood per rectum. 7. Severe diffuse hepatic steatosis. 8. Bilateral hydronephrosis without obstructing stone or mass. As there is evidence of a diverticulum arising from the right lateral wall of the bladder, the hydronephrosis could be secondary to neurogenic bladder. Clinical correlation. 9. Scarring diffusely throughout both kidneys. 10. Widely patent visceral arteries. 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  No acute cardiopulmonary disease. 12. Enlarged main pulmonary arteries, measured above, consistent with pulmonary arterial hypertension. 13. Multiple subcentimeter nodules within a normal sized thyroid gland. This does not require further imaging followup. This follows ACR consensus guidelines: Managing Incidental Thyroid Nodules Detected on Imaging: White Paper of the ACR Incidental Thyroid Findings Committee. J Am Coll Radiol 2015; 12:143-150.  These results were discussed directly by telephone with Dr. Colin Rhein of the emergency department at 1625 hr and 1630 hr. She   Electronically Signed   By: Evangeline Dakin M.D.   On: 06/20/2014 16:46   Ct Cta  Abd/pel W/cm &/or W/o Cm  06/20/2014   CLINICAL DATA:  At least 2 unwitnessed falls at home earlier today. Decreasing a blood pressure while in the emergency department. Acute onset abdominal pain, lumbar back pain and bilateral lower extremity pain. Surgical history includes Nissen fundoplication, light cysts of intestinal adhesions, and TURP.  EXAM: CT ANGIOGRAPHY CHEST, ABDOMEN AND PELVIS  TECHNIQUE: Multidetector CT imaging through the chest, abdomen and pelvis was performed using the standard protocol before and during bolus administration of intravenous contrast. Multiplanar reconstructed images and MIPs were obtained and reviewed to evaluate the vascular anatomy.  CONTRAST:  100 ml Omnipaque 350 IV.  It is noted that the patient has an elevated BUN of 48 and creatinine of 3.90. However, the emergency department physician felt that the current symptoms warranted the use of intravenous iodinated contrast in order to exclude an aortic dissection.  COMPARISON:  CT abdomen and pelvis 02/13/2013, 01/03/2013, 04/28/2008. CT chest 04/28/2008.  FINDINGS: CTA CHEST FINDINGS  Respiratory motion blurs images throughout the examination of the chest, rendering the examination less than optimal. Unenhanced images demonstrate no evidence of mural hematoma involving the thoracic aorta. Enhanced images demonstrate no evidence of dissection or aneurysm. Mild atherosclerosis is present involving the descending thoracic aorta. Proximal great vessels widely patent.  Heart size normal, though there may be mild left atrial enlargement. No visible coronary atherosclerosis. No pericardial effusion. Enlarged main pulmonary arteries, the right measuring up to 3.5 cm approximately diameter and the left up to approximately 3.0 cm diameter. No convincing evidence of central pulmonary embolism, though the timing of contrast enhancement was not optimal for visualization of the pulmonary arteries.  Emphysematous changes throughout both lungs.  Pulmonary parenchyma clear without localized airspace consolidation, interstitial disease, or parenchymal nodules or masses. No pleural effusions. Central airways patent.  No significant mediastinal, hilar or axillary lymphadenopathy. Thyroid gland normal in size containing numerous small nodules, the largest approximating 8 mm. Fluid throughout the esophagus. Small hiatal hernia which will be discussed below.  Bone window images demonstrate degenerative disc disease and spondylosis involving the lower cervical spine with sclerosis involving the C7 vertebral body. No sclerotic vertebrae elsewhere in the visualized lower cervical were thoracic spine. Mild osseous demineralization is suspected.  Review of the MIP images confirms the above findings.  CTA ABDOMEN AND PELVIS FINDINGS  Respiratory motion blurs many of the images of the abdomen, particularly the upper abdomen, rendering the examination less than optimal. Unenhanced images demonstrate no evidence of mural thrombus involving the upper abdominal aorta. Enhanced images demonstrate  mild atherosclerosis involving the distal abdominal aorta without evidence of aneurysm or dissection. Mild left common iliac atherosclerosis. Widely patent celiac artery, SMA, and IMA. Widely patent single renal arteries bilaterally.  There is a round collection of contrast adjacent to or involving the wall of the distal esophagus or gastric cardia at the EG junction, best seen on the coronal reformatted images, measuring approximately 1.9 x 1.3 cm. This is immediately adjacent to surgical clips placed at the time of the prior Nissen procedure. Small to moderate sized recurrent hiatal hernia. Fluid-filled stomach which is not distended.  Severe diffuse hepatic steatosis. The liver is suboptimally imaged due to the respiratory motion, but no focal parenchymal abnormality is identified. Similarly, the spleen is unremarkable. I suspect diffuse enlargement of the pancreas and associated  peripancreatic edema/inflammation. There is also edema/inflammation surrounding the descending portion of the duodenum.  Normal adrenal glands. Hydroureteronephrosis involving both kidneys without evidence of obstructing stone or mass. Severe scarring involving both kidneys. Patchy enhancement the kidneys consistent with the renal insufficiency. Cortical cysts involving both kidneys. No visible solid renal masses. No significant lymphadenopathy.  The remainder the small bowel is unremarkable. Moderate stool burden in the ascending and transverse colon, with borderline gaseous distension of the transverse colon. Descending colon, sigmoid colon and rectum relatively decompressed. Wall thickening over a several cm segment of the distal rectum to the anal verge. No free intraperitoneal air. Small amount of ascites dependently in the pelvis.  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 No visible masses at either inferior orifice to account for the bilateral hydronephrosis. 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 Phleboliths low in both sides of the pelvis.  Bone window images demonstrate degenerative disc disease and spondylosis at L1-2 and L2-3, facet degenerative changes at L4-5 and L5-S1, and bone islands in the right iliac bone.  Review of the MIP images confirms the above findings.  IMPRESSION: 1. The examination was significantly degraded by respiratory motion. 2. No evidence of thoracic or abdominal aortic aneurysm or dissection. 3. 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 4. Acute pancreatitis is suspected. 5. Acute duodenitis is also suspected. This may be secondary to the presumed pancreatitis. 6. Proctitis involving the distal rectum to the level of the anal verge. This likely explains the bright red blood per rectum. 7. Severe diffuse  hepatic steatosis. 8. Bilateral hydronephrosis without obstructing stone or mass. As there is evidence of a diverticulum arising from the right lateral wall of the bladder, the hydronephrosis could be secondary to neurogenic bladder. Clinical correlation. 9. Scarring diffusely throughout both kidneys. 10. Widely patent visceral arteries. 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  No acute cardiopulmonary disease. 12. Enlarged main pulmonary arteries, measured above, consistent with pulmonary arterial hypertension. 13. Multiple subcentimeter nodules within a normal sized thyroid gland. This does not require further imaging followup. This follows ACR consensus guidelines: Managing Incidental Thyroid Nodules Detected on Imaging: White Paper of the ACR Incidental Thyroid Findings Committee. J Am Coll Radiol 2015; 12:143-150.  These results were discussed directly by telephone with Dr. Colin Rhein of the emergency department at 1625 hr and 1630 hr. She   Electronically Signed   By: Evangeline Dakin M.D.   On: 07/01/2014 16:46         ASSESSMENT / PLAN:  PULMONARY OETT 07/12/2014 ER A:acute resp failure due to renal failure, lactic acidosis P:  PRVC  CARDIOVASCULAR CVL- LEft IJ 06/27/2014  A: AT risk for frank shock. Currently occult shock + P:  Hydrate CVP goal >10 MAP goal > 65  RENAL A:  Acute renal failure  - non obst hydro on CT Proffound lactic acidosis - not on metformin, invokana P:   Hydrate aggressively Track lactate q4h Foley Check CK  GASTROINTESTINAL A:  Possioble pancreatitis with ? Shock liver  P:   NPO Await CCS consult  HEMATOLOGIC A:  Coagulopathic   P:  Monitor closely  INFECTIOUS Blood  Urine Trach aspirate  A:  ? Septic shock P:   Broad abx  ENDOCRINE A:  PRe-DM at baseline   P:   ssi  NEUROLOGIC A:  At risk for delirium P:   Fent prn -> gtt Avoid diprivan due to acidosis  FAMILY  - Updates:  No family at bedside  - Inter-disciplinary family meet or Palliative  Care meeting due by: 06/28/14    The patient is critically ill with multiple organ systems failure and requires high complexity decision making for assessment and support, frequent evaluation and titration of therapies, application of advanced monitoring technologies and extensive interpretation of multiple databases.   Critical Care Time devoted to patient care services described in this note is  60  Minutes. This time reflects time of care of this signee Dr Brand Males. This critical care time does not reflect procedure time, or teaching time or supervisory time of PA/NP/Med student/Med Resident etc but could involve care discussion time    Dr. Brand Males, M.D., [facility].C.P Pulmonary and Critical Care Medicine Staff Physician [place] [facility] Pager: [phone], If no answer or between  15:00h - 7:00h: call [phone]  07/07/2014 6:48 PM

## 2014-06-21 NOTE — Progress Notes (Signed)
Patient intubated by ED physician placed on above settings MD aware, good color change on end tidal CO2 detector, BBS equal clear to diminished SATS remained at 100% during and after intubation. Will continue to monitor patient.

## 2014-06-21 NOTE — Progress Notes (Signed)
ANTIBIOTIC CONSULT NOTE - INITIAL  Pharmacy Consult for zosyn Indication: rule out sepsis  Allergies  Allergen Reactions  . Chloraseptic Sore Throat [Acetaminophen] Other (See Comments)    Spray burnt mouth!  . Milk-Related Compounds Other (See Comments)    Causes gas and diarrhea    Patient Measurements: Height: 6' (182.9 cm) IBW/kg (Calculated) : 77.6   Vital Signs: BP: 118/76 mmHg (02/06 1845) Pulse Rate: 103 (02/06 1845) Intake/Output from previous day:   Intake/Output from this shift:    Labs:  Recent Labs  06/20/14 1230 06/20/2014 1445 07/02/2014 1503 06/28/2014 1600  WBC 13.4*  --   --  24.7*  HGB 15.3  --  16.3 13.9  PLT 101.0*  --   --  104*  CREATININE 2.02* 4.15* 3.90*  --    Estimated Creatinine Clearance: 20.7 mL/min (by C-G formula based on Cr of 3.9). No results for input(s): VANCOTROUGH, VANCOPEAK, VANCORANDOM, GENTTROUGH, GENTPEAK, GENTRANDOM, TOBRATROUGH, TOBRAPEAK, TOBRARND, AMIKACINPEAK, AMIKACINTROU, AMIKACIN in the last 72 hours.   Microbiology: No results found for this or any previous visit (from the past 720 hour(s)).  Medical History: Past Medical History  Diagnosis Date  . Hyperlipidemia   . HTN (hypertension)   . Allergic rhinitis   . Headache(784.0)   . Nocturia   . Urinary retention   . Foley catheter in place   . UTI (urinary tract infection)   . GERD (gastroesophageal reflux disease)   . Prediabetes   . Depression   . Arthritis   . Bell's palsy     Medications:  See medication history Assessment: 64 yo man admitted with acute abdominal pain and lactic acidosis to start zosyn.  His CrCl ~ 20 ml/min.  Goal of Therapy:  Eradication of infection  Plan:  Zosyn 3.375 gm IV X 1 then 2.25 gm IV q6 hours F/u renal function, cultures and clinical course  Thanks for allowing pharmacy to be a part of this patient's care.  Excell Seltzer, PharmD Clinical Pharmacist, [phone]  07/07/2014,7:02 PM

## 2014-06-21 NOTE — ED Notes (Signed)
This RN accompanied pt to ct scanner, unable to obtain IV for angio, Colin Rhein, MD & Betsey Holiday, MD arrived to [place], Colin Rhein, MD placed US guided IV for CT scan

## 2014-06-21 NOTE — Consult Note (Signed)
Reason for Consult:acute abdomen Referring Physician: Dr. Arna Snipe Bently is an 64 y.o. male.  HPI: I have been asked to evaluate this patient by CCM for an acute abdomen. The patient is currently intubated in the emergency department. He apparently presented with weakness, hypotension, and abdominal pain. He has been having chronic abdominal pain and was currently being worked up by gastroenterology. He had a recent negative upper and lower endoscopy. When he arrived, he was found to be tachypnic and mottled. He was emergently intubated. He has been profoundly acidotic with an extremely elevated lactate level and in multisystem organ failure.   Past Medical History  Diagnosis Date  . Hyperlipidemia   . HTN (hypertension)   . Allergic rhinitis   . Headache(784.0)   . Nocturia   . Urinary retention   . Foley catheter in place   . UTI (urinary tract infection)   . GERD (gastroesophageal reflux disease)   . Prediabetes   . Depression   . Arthritis   . Bell's palsy     Past Surgical History  Procedure Laterality Date  . Hernia repair      inguinal /left  . Nissen fundoplication    . Laparoscopic lysis intestinal adhesions    . Transurethral resection of prostate N/A 03/20/2013    Procedure: TRANSURETHRAL RESECTION OF THE PROSTATE WITH GYRUS INSTRUMENTS;  Surgeon: Molli Hazard, MD;  Location: [facility];  Service: Urology;  Laterality: N/A;  . Carpal tunnel release      left hand  . Nerve surgery      left arm    Family History  Problem Relation Age of Onset  . Colon cancer Mother   . Prostate cancer Father   . Arthritis Brother   . Fibromyalgia Brother     Social History:  reports that he has been passively smoking.  He quit smokeless tobacco use about 36 years ago. His smokeless tobacco use included Chew. He reports that he does not drink alcohol or use illicit drugs.  Allergies:  Allergies  Allergen Reactions  . Chloraseptic Sore Throat [Acetaminophen]  Other (See Comments)    Spray burnt mouth!  . Milk-Related Compounds Other (See Comments)    Causes gas and diarrhea    Medications: I have reviewed the patient's current medications.  Results for orders placed or performed during the hospital encounter of 06/19/2014 (from the past 48 hour(s))  Comprehensive metabolic panel     Status: Abnormal   Collection Time: 07/02/2014  2:45 PM  Result Value Ref Range   Sodium 140 135 - 145 mmol/L   Potassium 5.4 (H) 3.5 - 5.1 mmol/L   Chloride 103 96 - 112 mmol/L   CO2 7 (LL) 19 - 32 mmol/L    Comment: REPEATED TO VERIFY CRITICAL RESULT CALLED TO, READ BACK BY AND VERIFIED WITH: J.DAVIS,RN 06/29/2014 @ 1711 BY V.WILKINS    Glucose, Bld 118 (H) 70 - 99 mg/dL   BUN 52 (H) 6 - 23 mg/dL   Creatinine, Ser 4.15 (H) 0.50 - 1.35 mg/dL   Calcium 8.1 (L) 8.4 - 10.5 mg/dL   Total Protein 4.2 (L) 6.0 - 8.3 g/dL   Albumin 2.5 (L) 3.5 - 5.2 g/dL   AST 191 (H) 0 - 37 U/L   ALT 228 (H) 0 - 53 U/L    Comment: RESULTS CONFIRMED BY MANUAL DILUTION   Alkaline Phosphatase 81 39 - 117 U/L   Total Bilirubin 4.4 (H) 0.3 - 1.2 mg/dL   GFR  calc non Af Amer 14 (L) >90 mL/min   GFR calc Af Amer 16 (L) >90 mL/min    Comment: (NOTE) The eGFR has been calculated using the CKD EPI equation. This calculation has not been validated in all clinical situations. eGFR's persistently <90 mL/min signify possible Chronic Kidney Disease.    Anion gap 30 (H) 5 - 15  Protime-INR     Status: Abnormal   Collection Time: 07/13/2014  2:45 PM  Result Value Ref Range   Prothrombin Time 25.9 (H) 11.6 - 15.2 seconds   INR 2.34 (H) 0.00 - 1.49  Type and screen for Red Blood Exchange     Status: None   Collection Time: 06/18/2014  2:45 PM  Result Value Ref Range   ABO/RH(D) A POS    Antibody Screen NEG    Sample Expiration 06/24/2014   ABO/Rh     Status: None   Collection Time: 06/20/2014  2:45 PM  Result Value Ref Range   ABO/RH(D) A POS   CBG monitoring, ED     Status: Abnormal    Collection Time: 06/28/2014  2:48 PM  Result Value Ref Range   Glucose-Capillary 61 (L) 70 - 99 mg/dL  I-Stat arterial blood gas, ED     Status: Abnormal   Collection Time: 06/24/2014  2:55 PM  Result Value Ref Range   pH, Arterial 7.034 (LL) 7.350 - 7.450   pCO2 arterial 12.2 (LL) 35.0 - 45.0 mmHg   pO2, Arterial 192.0 (H) 80.0 - 100.0 mmHg   Bicarbonate 3.2 (L) 20.0 - 24.0 mEq/L   TCO2 <5 0 - 100 mmol/L   O2 Saturation 99.0 %   Acid-base deficit 25.0 (H) 0.0 - 2.0 mmol/L   Patient temperature 98.6 F    Collection site RADIAL, ALLEN'S TEST ACCEPTABLE    Drawn by Operator    Sample type ARTERIAL    Comment MD NOTIFIED, REPEAT TEST   I-stat troponin, ED (not at [facility])     Status: None   Collection Time: 06/20/2014  3:01 PM  Result Value Ref Range   Troponin i, poc 0.02 0.00 - 0.08 ng/mL   Comment 3            Comment: Due to the release kinetics of cTnI, a negative result within the first hours of the onset of symptoms does not rule out myocardial infarction with certainty. If myocardial infarction is still suspected, repeat the test at appropriate intervals.   I-stat chem 8, ed     Status: Abnormal   Collection Time: 07/10/2014  3:03 PM  Result Value Ref Range   Sodium 139 135 - 145 mmol/L   Potassium 5.0 3.5 - 5.1 mmol/L   Chloride 104 96 - 112 mmol/L   BUN 48 (H) 6 - 23 mg/dL   Creatinine, Ser 3.90 (H) 0.50 - 1.35 mg/dL   Glucose, Bld 107 (H) 70 - 99 mg/dL   Calcium, Ion 0.98 (L) 1.13 - 1.30 mmol/L   TCO2 6 0 - 100 mmol/L   Hemoglobin 16.3 13.0 - 17.0 g/dL   HCT 48.0 39.0 - 52.0 %   Comment NOTIFIED PHYSICIAN   I-Stat CG4 Lactic Acid, ED     Status: Abnormal   Collection Time: 07/13/2014  3:03 PM  Result Value Ref Range   Lactic Acid, Venous >17.00 (HH) 0.5 - 2.0 mmol/L   Comment NOTIFIED PHYSICIAN   CBC with Differential/Platelet     Status: Abnormal   Collection Time: 07/03/2014  4:00 PM  Result  Value Ref Range   WBC 24.7 (H) 4.0 - 10.5 K/uL    Comment: WHITE COUNT  CONFIRMED ON SMEAR   RBC 4.17 (L) 4.22 - 5.81 MIL/uL   Hemoglobin 13.9 13.0 - 17.0 g/dL   HCT 42.6 39.0 - 52.0 %   MCV 102.2 (H) 78.0 - 100.0 fL   MCH 33.3 26.0 - 34.0 pg   MCHC 32.6 30.0 - 36.0 g/dL   RDW 16.4 (H) 11.5 - 15.5 %   Platelets 104 (L) 150 - 400 K/uL    Comment: PLATELET COUNT CONFIRMED BY SMEAR   Neutrophils Relative % 87 (H) 43 - 77 %   Neutro Abs 21.5 (H) 1.7 - 7.7 K/uL   Lymphocytes Relative 6 (L) 12 - 46 %   Lymphs Abs 1.5 0.7 - 4.0 K/uL   Monocytes Relative 7 3 - 12 %   Monocytes Absolute 1.7 (H) 0.1 - 1.0 K/uL   Eosinophils Relative 0 0 - 5 %   Eosinophils Absolute 0.0 0.0 - 0.7 K/uL   Basophils Relative 0 0 - 1 %   Basophils Absolute 0.0 0.0 - 0.1 K/uL   Smear Review MORPHOLOGY UNREMARKABLE   CBG monitoring, ED     Status: Abnormal   Collection Time: 06/20/2014  4:08 PM  Result Value Ref Range   Glucose-Capillary 65 (L) 70 - 99 mg/dL   Comment 1 Documented in Chart    Comment 2 Notify RN   I-Stat CG4 Lactic Acid, ED     Status: Abnormal   Collection Time: 07/01/2014  5:54 PM  Result Value Ref Range   Lactic Acid, Venous >17.00 (HH) 0.5 - 2.0 mmol/L   Comment NOTIFIED PHYSICIAN   I-Stat arterial blood gas, ED     Status: Abnormal   Collection Time: 06/20/2014  6:22 PM  Result Value Ref Range   pH, Arterial 6.939 (LL) 7.350 - 7.450   pCO2 arterial 21.0 (L) 35.0 - 45.0 mmHg   pO2, Arterial 174.0 (H) 80.0 - 100.0 mmHg   Bicarbonate 4.5 (L) 20.0 - 24.0 mEq/L   TCO2 5 0 - 100 mmol/L   O2 Saturation 98.0 %   Acid-base deficit 27.0 (H) 0.0 - 2.0 mmol/L   Patient temperature 98.6 F    Collection site RADIAL, ALLEN'S TEST ACCEPTABLE    Drawn by Operator    Sample type ARTERIAL    Comment MD NOTIFIED, REPEAT TEST   POC CBG, ED     Status: None   Collection Time: 06/17/2014  6:40 PM  Result Value Ref Range   Glucose-Capillary 98 70 - 99 mg/dL  CBG monitoring, ED     Status: Abnormal   Collection Time: 07/09/2014  6:49 PM  Result Value Ref Range   Glucose-Capillary  110 (H) 70 - 99 mg/dL    Dg Chest Portable 1 View  06/28/2014   CLINICAL DATA:  Post intubation and central line placement.  EXAM: PORTABLE CHEST - 1 VIEW  COMPARISON:  CT chest and chest radiograph 06/17/2014.  FINDINGS: Endotracheal tube terminates approximately 2.2 cm above the carina. Left IJ central line tip projects over the upper SVC. Heart size stable. There may be minimal subsegmental atelectasis at the lung bases. Lungs are low in volume. No pleural fluid. No pneumothorax.  IMPRESSION: 1. Endotracheal tube terminates approximately 2.2 cm above the carina. Retracting approximately 1 cm would better position the tip above the carina. 2. Left IJ central line placement without pneumothorax.   Electronically Signed   By: Lorin Picket  M.D.   On: 06/28/2014 18:39   Dg Chest Portable 1 View  07/06/2014   CLINICAL DATA:  Trauma patient. Loss of consciousness following 2x earlier today. Patient now complaining of abdominal, back and bilateral leg pain.  EXAM: PORTABLE CHEST - 1 VIEW  COMPARISON:  03/18/2013  FINDINGS: Apparent enlargement of the cardiac silhouette and mediastinal contours, in particular, there is apparent enlargement of the ascending and descending thoracic aorta. No focal airspace opacities. No pleural effusion or pneumothorax. No evidence of edema. No acute osseus abnormalities.  IMPRESSION: Apparent enlargement of the cardiac silhouette and mediastinal contours, possibly accentuated due to decreased lung volumes and patient rotation though a thoracic aortic aneurysm and/or dissection could have a similar appearance. Further evaluation could be performed with PA and lateral chest radiograph and/or chest CTA as clinically indicated.  Critical Value/emergent results were called by telephone at the time of interpretation on 06/29/2014 at 3:13 pm to Dr. Debby Freiberg , who verbally acknowledged these results.   Electronically Signed   By: Sandi Mariscal M.D.   On: 07/12/2014 15:15   Ct Angio  Chest Aorta W/cm &/or Wo/cm  06/16/2014   CLINICAL DATA:  At least 2 unwitnessed falls at home earlier today. Decreasing a blood pressure while in the emergency department. Acute onset abdominal pain, lumbar back pain and bilateral lower extremity pain. Surgical history includes Nissen fundoplication, light cysts of intestinal adhesions, and TURP.  EXAM: CT ANGIOGRAPHY CHEST, ABDOMEN AND PELVIS  TECHNIQUE: Multidetector CT imaging through the chest, abdomen and pelvis was performed using the standard protocol before and during bolus administration of intravenous contrast. Multiplanar reconstructed images and MIPs were obtained and reviewed to evaluate the vascular anatomy.  CONTRAST:  100 ml Omnipaque 350 IV.  It is noted that the patient has an elevated BUN of 48 and creatinine of 3.90. However, the emergency department physician felt that the current symptoms warranted the use of intravenous iodinated contrast in order to exclude an aortic dissection.  COMPARISON:  CT abdomen and pelvis 02/13/2013, 01/03/2013, 04/28/2008. CT chest 04/28/2008.  FINDINGS: CTA CHEST FINDINGS  Respiratory motion blurs images throughout the examination of the chest, rendering the examination less than optimal. Unenhanced images demonstrate no evidence of mural hematoma involving the thoracic aorta. Enhanced images demonstrate no evidence of dissection or aneurysm. Mild atherosclerosis is present involving the descending thoracic aorta. Proximal great vessels widely patent.  Heart size normal, though there may be mild left atrial enlargement. No visible coronary atherosclerosis. No pericardial effusion. Enlarged main pulmonary arteries, the right measuring up to 3.5 cm approximately diameter and the left up to approximately 3.0 cm diameter. No convincing evidence of central pulmonary embolism, though the timing of contrast enhancement was not optimal for visualization of the pulmonary arteries.  Emphysematous changes throughout both  lungs. Pulmonary parenchyma clear without localized airspace consolidation, interstitial disease, or parenchymal nodules or masses. No pleural effusions. Central airways patent.  No significant mediastinal, hilar or axillary lymphadenopathy. Thyroid gland normal in size containing numerous small nodules, the largest approximating 8 mm. Fluid throughout the esophagus. Small hiatal hernia which will be discussed below.  Bone window images demonstrate degenerative disc disease and spondylosis involving the lower cervical spine with sclerosis involving the C7 vertebral body. No sclerotic vertebrae elsewhere in the visualized lower cervical were thoracic spine. Mild osseous demineralization is suspected.  Review of the MIP images confirms the above findings.  CTA ABDOMEN AND PELVIS FINDINGS  Respiratory motion blurs many of the images of the abdomen,  particularly the upper abdomen, rendering the examination less than optimal. Unenhanced images demonstrate no evidence of mural thrombus involving the upper abdominal aorta. Enhanced images demonstrate mild atherosclerosis involving the distal abdominal aorta without evidence of aneurysm or dissection. Mild left common iliac atherosclerosis. Widely patent celiac artery, SMA, and IMA. Widely patent single renal arteries bilaterally.  There is a round collection of contrast adjacent to or involving the wall of the distal esophagus or gastric cardia at the EG junction, best seen on the coronal reformatted images, measuring approximately 1.9 x 1.3 cm. This is immediately adjacent to surgical clips placed at the time of the prior Nissen procedure. Small to moderate sized recurrent hiatal hernia. Fluid-filled stomach which is not distended.  Severe diffuse hepatic steatosis. The liver is suboptimally imaged due to the respiratory motion, but no focal parenchymal abnormality is identified. Similarly, the spleen is unremarkable. I suspect diffuse enlargement of the pancreas and  associated peripancreatic edema/inflammation. There is also edema/inflammation surrounding the descending portion of the duodenum.  Normal adrenal glands. Hydroureteronephrosis involving both kidneys without evidence of obstructing stone or mass. Severe scarring involving both kidneys. Patchy enhancement the kidneys consistent with the renal insufficiency. Cortical cysts involving both kidneys. No visible solid renal masses. No significant lymphadenopathy.  The remainder the small bowel is unremarkable. Moderate stool burden in the ascending and transverse colon, with borderline gaseous distension of the transverse colon. Descending colon, sigmoid colon and rectum relatively decompressed. Wall thickening over a several cm segment of the distal rectum to the anal verge. No free intraperitoneal air. Small amount of ascites dependently in the pelvis.  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 No visible masses at either inferior orifice to account for the bilateral hydronephrosis. 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 Phleboliths low in both sides of the pelvis.  Bone window images demonstrate degenerative disc disease and spondylosis at L1-2 and L2-3, facet degenerative changes at L4-5 and L5-S1, and bone islands in the right iliac bone.  Review of the MIP images confirms the above findings.  IMPRESSION: 1. The examination was significantly degraded by respiratory motion. 2. No evidence of thoracic or abdominal aortic aneurysm or dissection. 3. 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 4. Acute pancreatitis is suspected. 5. Acute duodenitis is also suspected. This may be secondary to the presumed pancreatitis. 6. Proctitis involving the distal rectum to the level of the anal verge. This likely explains the bright red blood per rectum. 7. Severe  diffuse hepatic steatosis. 8. Bilateral hydronephrosis without obstructing stone or mass. As there is evidence of a diverticulum arising from the right lateral wall of the bladder, the hydronephrosis could be secondary to neurogenic bladder. Clinical correlation. 9. Scarring diffusely throughout both kidneys. 10. Widely patent visceral arteries. 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  No acute cardiopulmonary disease. 12. Enlarged main pulmonary arteries, measured above, consistent with pulmonary arterial hypertension. 13. Multiple subcentimeter nodules within a normal sized thyroid gland. This does not require further imaging followup. This follows ACR consensus guidelines: Managing Incidental Thyroid Nodules Detected on Imaging: White Paper of the ACR Incidental Thyroid Findings Committee. J Am Coll Radiol 2015; 12:143-150.  These results were discussed directly by telephone with Dr. Colin Rhein of the emergency department at 1625 hr and 1630 hr. She   Electronically Signed   By: Evangeline Dakin M.D.   On: 06/28/2014 16:46  Ct Cta Abd/pel W/cm &/or W/o Cm  06/16/2014   CLINICAL DATA:  At least 2 unwitnessed falls at home earlier today. Decreasing a blood pressure while in the emergency department. Acute onset abdominal pain, lumbar back pain and bilateral lower extremity pain. Surgical history includes Nissen fundoplication, light cysts of intestinal adhesions, and TURP.  EXAM: CT ANGIOGRAPHY CHEST, ABDOMEN AND PELVIS  TECHNIQUE: Multidetector CT imaging through the chest, abdomen and pelvis was performed using the standard protocol before and during bolus administration of intravenous contrast. Multiplanar reconstructed images and MIPs were obtained and reviewed to evaluate the vascular anatomy.  CONTRAST:  100 ml Omnipaque 350 IV.  It is noted that the patient has an elevated BUN of 48 and creatinine of 3.90. However, the emergency department physician felt that the current symptoms warranted the use of intravenous iodinated  contrast in order to exclude an aortic dissection.  COMPARISON:  CT abdomen and pelvis 02/13/2013, 01/03/2013, 04/28/2008. CT chest 04/28/2008.  FINDINGS: CTA CHEST FINDINGS  Respiratory motion blurs images throughout the examination of the chest, rendering the examination less than optimal. Unenhanced images demonstrate no evidence of mural hematoma involving the thoracic aorta. Enhanced images demonstrate no evidence of dissection or aneurysm. Mild atherosclerosis is present involving the descending thoracic aorta. Proximal great vessels widely patent.  Heart size normal, though there may be mild left atrial enlargement. No visible coronary atherosclerosis. No pericardial effusion. Enlarged main pulmonary arteries, the right measuring up to 3.5 cm approximately diameter and the left up to approximately 3.0 cm diameter. No convincing evidence of central pulmonary embolism, though the timing of contrast enhancement was not optimal for visualization of the pulmonary arteries.  Emphysematous changes throughout both lungs. Pulmonary parenchyma clear without localized airspace consolidation, interstitial disease, or parenchymal nodules or masses. No pleural effusions. Central airways patent.  No significant mediastinal, hilar or axillary lymphadenopathy. Thyroid gland normal in size containing numerous small nodules, the largest approximating 8 mm. Fluid throughout the esophagus. Small hiatal hernia which will be discussed below.  Bone window images demonstrate degenerative disc disease and spondylosis involving the lower cervical spine with sclerosis involving the C7 vertebral body. No sclerotic vertebrae elsewhere in the visualized lower cervical were thoracic spine. Mild osseous demineralization is suspected.  Review of the MIP images confirms the above findings.  CTA ABDOMEN AND PELVIS FINDINGS  Respiratory motion blurs many of the images of the abdomen, particularly the upper abdomen, rendering the examination less  than optimal. Unenhanced images demonstrate no evidence of mural thrombus involving the upper abdominal aorta. Enhanced images demonstrate mild atherosclerosis involving the distal abdominal aorta without evidence of aneurysm or dissection. Mild left common iliac atherosclerosis. Widely patent celiac artery, SMA, and IMA. Widely patent single renal arteries bilaterally.  There is a round collection of contrast adjacent to or involving the wall of the distal esophagus or gastric cardia at the EG junction, best seen on the coronal reformatted images, measuring approximately 1.9 x 1.3 cm. This is immediately adjacent to surgical clips placed at the time of the prior Nissen procedure. Small to moderate sized recurrent hiatal hernia. Fluid-filled stomach which is not distended.  Severe diffuse hepatic steatosis. The liver is suboptimally imaged due to the respiratory motion, but no focal parenchymal abnormality is identified. Similarly, the spleen is unremarkable. I suspect diffuse enlargement of the pancreas and associated peripancreatic edema/inflammation. There is also edema/inflammation surrounding the descending portion of the duodenum.  Normal adrenal glands. Hydroureteronephrosis involving both kidneys without evidence of obstructing stone or  mass. Severe scarring involving both kidneys. Patchy enhancement the kidneys consistent with the renal insufficiency. Cortical cysts involving both kidneys. No visible solid renal masses. No significant lymphadenopathy.  The remainder the small bowel is unremarkable. Moderate stool burden in the ascending and transverse colon, with borderline gaseous distension of the transverse colon. Descending colon, sigmoid colon and rectum relatively decompressed. Wall thickening over a several cm segment of the distal rectum to the anal verge. No free intraperitoneal air. Small amount of ascites dependently in the pelvis.  Diverticulum arising from the right lateral wall of the mid  urinary bladder. No visible masses at either inferior orifice to account for the bilateral hydronephrosis. 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 Phleboliths low in both sides of the pelvis.  Bone window images demonstrate degenerative disc disease and spondylosis at L1-2 and L2-3, facet degenerative changes at L4-5 and L5-S1, and bone islands in the right iliac bone.  Review of the MIP images confirms the above findings.  IMPRESSION: 1. The examination was significantly degraded by respiratory motion. 2. No evidence of thoracic or abdominal aortic aneurysm or dissection. 3. 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 4. Acute pancreatitis is suspected. 5. Acute duodenitis is also suspected. This may be secondary to the presumed pancreatitis. 6. Proctitis involving the distal rectum to the level of the anal verge. This likely explains the bright red blood per rectum. 7. Severe diffuse hepatic steatosis. 8. Bilateral hydronephrosis without obstructing stone or mass. As there is evidence of a diverticulum arising from the right lateral wall of the bladder, the hydronephrosis could be secondary to neurogenic bladder. Clinical correlation. 9. Scarring diffusely throughout both kidneys. 10. Widely patent visceral arteries. 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  No acute cardiopulmonary disease. 12. Enlarged main pulmonary arteries, measured above, consistent with pulmonary arterial hypertension. 13. Multiple subcentimeter nodules within a normal sized thyroid gland. This does not require further imaging followup. This follows ACR consensus guidelines: Managing Incidental Thyroid Nodules Detected on Imaging: White Paper of the ACR Incidental Thyroid Findings Committee. J Am Coll Radiol 2015; 12:143-150.  These results were discussed directly by telephone with Dr. Colin Rhein of the emergency department at 1625  hr and 1630 hr. She   Electronically Signed   By: Evangeline Dakin M.D.   On: 07/02/2014 16:46    Review of Systems  Unable to perform ROS: intubated   Blood pressure 108/67, pulse 91, temperature 94.1 F (34.5 C), resp. rate 34, height 6' (1.829 m), SpO2 100 %. Physical Exam  Constitutional: He appears well-developed and well-nourished.  Intubated  HENT:  Head: Normocephalic and atraumatic.  Right Ear: External ear normal.  Left Ear: External ear normal.  Nose: Nose normal.  Mouth/Throat: No oropharyngeal exudate.  Eyes: Conjunctivae are normal. Pupils are equal, round, and reactive to light. No scleral icterus.  Neck: No tracheal deviation present.  Cardiovascular:  No murmur heard. Tachycardic with regular rhythm  Respiratory: He has rales.  Intubated  GI: He exhibits no distension. There is no guarding.  His abdomen is completely soft with no distention or rigidity or obvious hernia  Musculoskeletal: He exhibits no edema.  Lymphadenopathy:    He has no cervical adenopathy.  Neurological:  Intubated and sedated  Skin:  Mottled appearance to his extremities    Assessment/Plan: Acidosis and multisystem organ failure of uncertain etiology  He is becoming more acidotic and coagulopathic. The CT scan  of his abdomen was negative for free air or pneumatosis or any suggestion of ischemic bowel. His enteric blood vessels were widely patent. He does have findings consistent with pancreatitis.  This is a very difficult situation. Given his examination, I do not think he has infarcted his bowel. He will need ongoing resuscitation. I will transfuse FFP.  I do not believe he would tolerate an exploratory laparotomy although this may become necessary. We will follow him closely.  BLACKMAN,DOUGLAS A 06/30/2014, 8:14 PM

## 2014-06-21 NOTE — ED Notes (Addendum)
OG attempted x's 3 without success.

## 2014-06-21 NOTE — ED Notes (Signed)
Received call from pt's brother Reynald Woods who wishes to be called by MD when possible.    [phone]

## 2014-06-21 NOTE — ED Provider Notes (Signed)
CSN: [id number]     Arrival date & time 07/06/2014  1437  HPI  Review of Systems   Physical Exam  ED Course  INTUBATION Date/Time: 07/09/2014 5:00 PM Performed by: Katheren Shams Authorized by: Katheren Shams Consent: The procedure was performed in an emergent situation. Verbal consent obtained. Indications: respiratory failure Intubation method: video-assisted Patient status: paralyzed (RSI) Preoxygenation: BiPAP. Sedatives: etomidate Paralytic: rocuronium Laryngoscope size: Mac 4 Tube size: 7.5 mm Tube type: cuffed Number of attempts: 1 Cricoid pressure: no Cords visualized: yes Post-procedure assessment: chest rise and CO2 detector Breath sounds: equal and absent over the epigastrium ETT to lip: 23 cm Tube secured with: ETT holder Chest x-ray interpreted by me. Chest x-ray findings: endotracheal tube in appropriate position Patient tolerance: Patient tolerated the procedure well with no immediate complications  CENTRAL LINE Date/Time: 07/02/2014 1:07 PM Performed by: Katheren Shams Authorized by: Katheren Shams Consent: The procedure was performed in an emergent situation. Indications: vascular access Patient sedated: yes Sedatives: etomidate Vitals: Vital signs were monitored during sedation. Preparation: skin prepped with 2% chlorhexidine Skin prep agent dried: skin prep agent completely dried prior to procedure Sterile barriers: all five maximum sterile barriers used - cap, mask, sterile gown, sterile gloves, and large sterile sheet Hand hygiene: hand hygiene performed prior to central venous catheter insertion Location details: left internal jugular Patient position: reverse Trendelenburg Catheter type: triple lumen Pre-procedure: landmarks identified Ultrasound guidance: yes Sterile ultrasound techniques: sterile gel and sterile probe covers were used Number of attempts: 1 Successful placement: yes Post-procedure: line sutured Assessment: blood return through  all ports,  free fluid flow,  placement verified by x-ray and no pneumothorax on x-ray Patient tolerance: Patient tolerated the procedure well with no immediate complications    MDM   Final diagnoses:  Syncope and collapse  Shock  Metabolic acidosis  Abdominal pain, acute      Katheren Shams, MD 07/07/2014 [place], MD 07/15/14 2043

## 2014-06-21 NOTE — ED Notes (Signed)
Pt in from home  Per EMS report pt fell twice today unwitnessed, unknown LOC, rcvd 500 cc NS pta, pt 5mg  Albuterol pta, pt sys BP initially 140, last BP given 106, pt ST in route, pt c/o abd pain & bil legs, pt c/o back pain, pt speaks in one word sentences

## 2014-06-21 NOTE — ED Notes (Signed)
Abnormal lactic acid given to Dr. Betsey Holiday

## 2014-06-21 NOTE — ED Provider Notes (Signed)
CSN: [id number]     Arrival date & time 06/30/2014  1437 History   First MD Initiated Contact with Patient 06/22/2014 1447     Chief Complaint  Patient presents with  . Fall     (Consider location/radiation/quality/duration/timing/severity/associated sxs/prior Treatment) Patient is a 64 y.o. male presenting with syncope.  Loss of Consciousness Episode history:  Multiple Most recent episode:  Today Duration: very brief, unwitnessed. Timing:  Constant Progression:  Unchanged Chronicity:  New Context comment:  Abd pain x 4 days Witnessed: no   Relieved by:  Nothing Worsened by:  Nothing tried Ineffective treatments:  None tried Associated symptoms: no chest pain, no headaches, no rectal bleeding and no vomiting     Past Medical History  Diagnosis Date  . Hyperlipidemia   . HTN (hypertension)   . Allergic rhinitis   . Headache(784.0)   . Nocturia   . Urinary retention   . Foley catheter in place   . UTI (urinary tract infection)   . GERD (gastroesophageal reflux disease)   . Prediabetes   . Depression   . Arthritis   . Bell's palsy    Past Surgical History  Procedure Laterality Date  . Hernia repair      inguinal /left  . Nissen fundoplication    . Laparoscopic lysis intestinal adhesions    . Transurethral resection of prostate N/A 03/20/2013    Procedure: TRANSURETHRAL RESECTION OF THE PROSTATE WITH GYRUS INSTRUMENTS;  Surgeon: Molli Hazard, MD;  Location: [facility];  Service: Urology;  Laterality: N/A;  . Carpal tunnel release      left hand  . Nerve surgery      left arm   Family History  Problem Relation Age of Onset  . Colon cancer Mother   . Prostate cancer Father   . Arthritis Brother   . Fibromyalgia Brother    History  Substance Use Topics  . Smoking status: Passive Smoke Exposure - Never Smoker -- 34 years  . Smokeless tobacco: Former Systems developer    Types: [place] date: 01/03/1978     Comment: Back in the 70's  . Alcohol Use: No    Review of  Systems  Cardiovascular: Positive for syncope. Negative for chest pain.  Gastrointestinal: Negative for vomiting.  Neurological: Negative for headaches.  All other systems reviewed and are negative.     Allergies  Chloraseptic sore throat and Milk-related compounds  Home Medications   Prior to Admission medications   Medication Sig Start Date End Date Taking? Authorizing Provider  atenolol (TENORMIN) 100 MG tablet Take 1 tablet (100 mg total) by mouth daily. 07/29/13 07/29/14 Yes Unk Pinto, MD  atorvastatin (LIPITOR) 80 MG tablet Take 1 tablet (80 mg total) by mouth 3 (three) times a week. Patient taking differently: Take 40-80 mg by mouth 3 (three) times a week.  07/29/13  Yes Unk Pinto, MD  finasteride (PROSCAR) 5 MG tablet Take 5 mg by mouth daily.  07/30/13  Yes Historical Provider, MD  hyoscyamine (LEVSIN, ANASPAZ) 0.125 MG tablet Take 0.125 mg by mouth every 4 (four) hours as needed for bladder spasms.   Yes Historical Provider, MD  meloxicam (MOBIC) 15 MG tablet 1/2 to 1 tablet daily with food for arthritis pain and inflammation Patient taking differently: Take 7.5-15 mg by mouth daily. Take  with food for arthritis pain and inflammation 08/07/13  Yes Unk Pinto, MD  metoCLOPramide (REGLAN) 10 MG tablet Take 1 tablet (10 mg total) by mouth 4 (four)  times daily -  before meals and at bedtime. 05/20/14  Yes Inda Castle, MD  omeprazole (PRILOSEC OTC) 20 MG tablet Take 20 mg by mouth daily.   Yes Historical Provider, MD  omeprazole (PRILOSEC) 40 MG capsule Take 1 capsule (40 mg total) by mouth daily. 05/22/14  Yes Vicie Mutters, PA-C  oxyCODONE-acetaminophen (PERCOCET/ROXICET) 5-325 MG per tablet Take 1-2 tablets by mouth every 4 (four) hours as needed for severe pain.   Yes Historical Provider, MD  tamsulosin (FLOMAX) 0.4 MG CAPS capsule Take 0.4 mg by mouth at bedtime.    Yes Historical Provider, MD  B Complex-C (SUPER B COMPLEX PO) Take 1 tablet by mouth every morning.     Historical Provider, MD  Cholecalciferol (VITAMIN D PO) Take 2,000 Int'l Units by mouth daily.    Historical Provider, MD  cyclobenzaprine (FLEXERIL) 10 MG tablet Take 10 mg by mouth 3 (three) times daily as needed for muscle spasms.    Historical Provider, MD  Magnesium 500 MG TABS Take 1 tablet by mouth daily.    Historical Provider, MD  Multiple Vitamin (MULTIVITAMIN WITH MINERALS) TABS tablet Take 1 tablet by mouth every morning.     Historical Provider, MD  omega-3 acid ethyl esters (LOVAZA) 1 G capsule Take by mouth 2 (two) times daily.    Historical Provider, MD   BP 82/64 mmHg  Pulse 75  Temp(Src) 99.8 F (37.7 C) (Core (Comment))  Resp 35  Ht 6' (1.829 m)  Wt 208 lb 12.4 oz (94.7 kg)  BMI 28.31 kg/m2  SpO2 100% Physical Exam  Constitutional: He is oriented to person, place, and time. He appears well-developed and well-nourished.  HENT:  Head: Normocephalic and atraumatic.  Eyes: Conjunctivae and EOM are normal.  Neck: Normal range of motion. Neck supple.  Cardiovascular: Normal rate, regular rhythm and normal heart sounds.   Pulmonary/Chest: Effort normal and breath sounds normal. No respiratory distress.  Abdominal: He exhibits no distension. There is generalized tenderness (diffuse, mild). There is no rebound and no guarding.  Musculoskeletal: Normal range of motion.  Neurological: He is alert and oriented to person, place, and time.  Skin: Skin is warm and dry.  Vitals reviewed.   ED Course  INTUBATION Date/Time: 06/16/2014 7:00 AM Performed by: Debby Freiberg Authorized by: Debby Freiberg Consent: The procedure was performed in an emergent situation. Verbal consent obtained. Indications: respiratory distress Intubation method: video-assisted Patient status: paralyzed (RSI) Preoxygenation: bipap. Sedatives: etomidate Paralytic: rocuronium Laryngoscope size: glidescope 3. Tube size: 7.5 mm Tube type: cuffed Number of attempts: 1 Cords visualized:  yes Post-procedure assessment: chest rise,  CO2 detector and esophageal detector Breath sounds: equal Cuff inflated: yes ETT to lip: 25 cm Tube secured with: ETT holder Chest x-ray interpreted by me, other physician and radiologist. Chest x-ray findings: endotracheal tube in appropriate position Patient tolerance: Patient tolerated the procedure well with no immediate complications   (including critical care time) Labs Review Labs Reviewed  COMPREHENSIVE METABOLIC PANEL - Abnormal; Notable for the following:    Potassium 5.4 (*)    CO2 7 (*)    Glucose, Bld 118 (*)    BUN 52 (*)    Creatinine, Ser 4.15 (*)    Calcium 8.1 (*)    Total Protein 4.2 (*)    Albumin 2.5 (*)    AST 191 (*)    ALT 228 (*)    Total Bilirubin 4.4 (*)    GFR calc non Af Amer 14 (*)    GFR  calc Af Amer 16 (*)    Anion gap 30 (*)    All other components within normal limits  PROTIME-INR - Abnormal; Notable for the following:    Prothrombin Time 25.9 (*)    INR 2.34 (*)    All other components within normal limits  CBC WITH DIFFERENTIAL/PLATELET - Abnormal; Notable for the following:    WBC 24.7 (*)    RBC 4.17 (*)    MCV 102.2 (*)    RDW 16.4 (*)    Platelets 104 (*)    Neutrophils Relative % 87 (*)    Neutro Abs 21.5 (*)    Lymphocytes Relative 6 (*)    Monocytes Absolute 1.7 (*)    All other components within normal limits  LIPASE, BLOOD - Abnormal; Notable for the following:    Lipase 359 (*)    All other components within normal limits  URINALYSIS, ROUTINE W REFLEX MICROSCOPIC - Abnormal; Notable for the following:    Color, Urine ORANGE (*)    Hgb urine dipstick LARGE (*)    Bilirubin Urine SMALL (*)    Ketones, ur 15 (*)    Protein, ur 30 (*)    Leukocytes, UA TRACE (*)    All other components within normal limits  LIPID PANEL - Abnormal; Notable for the following:    HDL 5 (*)    All other components within normal limits  CK TOTAL AND CKMB - Abnormal; Notable for the following:     Total CK 359 (*)    CK, MB 14.5 (*)    Relative Index 4.0 (*)    All other components within normal limits  AMYLASE - Abnormal; Notable for the following:    Amylase 723 (*)    All other components within normal limits  COMPREHENSIVE METABOLIC PANEL - Abnormal; Notable for the following:    Potassium 5.4 (*)    CO2 15 (*)    Glucose, Bld 415 (*)    BUN 39 (*)    Creatinine, Ser 3.16 (*)    Calcium 6.0 (*)    Total Protein 3.4 (*)    Albumin 1.8 (*)    AST 175 (*)    ALT 163 (*)    Total Bilirubin 3.6 (*)    GFR calc non Af Amer 19 (*)    GFR calc Af Amer 22 (*)    Anion gap 23 (*)    All other components within normal limits  PHOSPHORUS - Abnormal; Notable for the following:    Phosphorus 7.0 (*)    All other components within normal limits  TROPONIN I - Abnormal; Notable for the following:    Troponin I 0.05 (*)    All other components within normal limits  TROPONIN I - Abnormal; Notable for the following:    Troponin I 0.05 (*)    All other components within normal limits  LACTIC ACID, PLASMA - Abnormal; Notable for the following:    Lactic Acid, Venous 16.8 (*)    All other components within normal limits  BRAIN NATRIURETIC PEPTIDE - Abnormal; Notable for the following:    B Natriuretic Peptide 103.8 (*)    All other components within normal limits  CBC WITH DIFFERENTIAL/PLATELET - Abnormal; Notable for the following:    WBC 17.4 (*)    RBC 3.37 (*)    Hemoglobin 11.2 (*)    HCT 33.1 (*)    RDW 16.7 (*)    Platelets 74 (*)    Neutrophils Relative %  86 (*)    Neutro Abs 15.0 (*)    Lymphocytes Relative 9 (*)    All other components within normal limits  PROTIME-INR - Abnormal; Notable for the following:    Prothrombin Time 27.8 (*)    INR 2.57 (*)    All other components within normal limits  APTT - Abnormal; Notable for the following:    aPTT 66 (*)    All other components within normal limits  URINALYSIS, ROUTINE W REFLEX MICROSCOPIC - Abnormal; Notable for  the following:    Color, Urine AMBER (*)    APPearance CLOUDY (*)    Specific Gravity, Urine 1.039 (*)    Hgb urine dipstick LARGE (*)    Bilirubin Urine SMALL (*)    Ketones, ur 15 (*)    Protein, ur 30 (*)    All other components within normal limits  CBC - Abnormal; Notable for the following:    WBC 12.3 (*)    RBC 3.33 (*)    Hemoglobin 10.9 (*)    HCT 32.8 (*)    RDW 16.9 (*)    Platelets 61 (*)    All other components within normal limits  BASIC METABOLIC PANEL - Abnormal; Notable for the following:    Potassium 5.8 (*)    CO2 11 (*)    Glucose, Bld 154 (*)    BUN 42 (*)    Creatinine, Ser 3.42 (*)    Calcium 6.2 (*)    GFR calc non Af Amer 17 (*)    GFR calc Af Amer 20 (*)    Anion gap 21 (*)    All other components within normal limits  BLOOD GAS, ARTERIAL - Abnormal; Notable for the following:    pH, Arterial 7.244 (*)    pCO2 arterial 18.7 (*)    pO2, Arterial 155.0 (*)    Bicarbonate 7.8 (*)    Acid-base deficit 18.3 (*)    All other components within normal limits  PHOSPHORUS - Abnormal; Notable for the following:    Phosphorus 6.9 (*)    All other components within normal limits  HEPATIC FUNCTION PANEL - Abnormal; Notable for the following:    Total Protein 4.0 (*)    Albumin 2.2 (*)    AST 182 (*)    ALT 154 (*)    Total Bilirubin 3.9 (*)    Bilirubin, Direct 2.9 (*)    Indirect Bilirubin 1.0 (*)    All other components within normal limits  URINE MICROSCOPIC-ADD ON - Abnormal; Notable for the following:    Squamous Epithelial / LPF FEW (*)    Bacteria, UA FEW (*)    Casts HYALINE CASTS (*)    All other components within normal limits  GLUCOSE, CAPILLARY - Abnormal; Notable for the following:    Glucose-Capillary 122 (*)    All other components within normal limits  URINE MICROSCOPIC-ADD ON - Abnormal; Notable for the following:    Squamous Epithelial / LPF FEW (*)    Bacteria, UA FEW (*)    Casts GRANULAR CAST (*)    All other components  within normal limits  CARBOXYHEMOGLOBIN - Abnormal; Notable for the following:    Total hemoglobin 11.1 (*)    Carboxyhemoglobin 0.2 (*)    Methemoglobin 1.6 (*)    All other components within normal limits  I-STAT CHEM 8, ED - Abnormal; Notable for the following:    BUN 48 (*)    Creatinine, Ser 3.90 (*)    Glucose,  Bld 107 (*)    Calcium, Ion 0.98 (*)    All other components within normal limits  I-STAT CG4 LACTIC ACID, ED - Abnormal; Notable for the following:    Lactic Acid, Venous >17.00 (*)    All other components within normal limits  I-STAT ARTERIAL BLOOD GAS, ED - Abnormal; Notable for the following:    pH, Arterial 7.034 (*)    pCO2 arterial 12.2 (*)    pO2, Arterial 192.0 (*)    Bicarbonate 3.2 (*)    Acid-base deficit 25.0 (*)    All other components within normal limits  CBG MONITORING, ED - Abnormal; Notable for the following:    Glucose-Capillary 61 (*)    All other components within normal limits  CBG MONITORING, ED - Abnormal; Notable for the following:    Glucose-Capillary 65 (*)    All other components within normal limits  I-STAT CG4 LACTIC ACID, ED - Abnormal; Notable for the following:    Lactic Acid, Venous >17.00 (*)    All other components within normal limits  I-STAT ARTERIAL BLOOD GAS, ED - Abnormal; Notable for the following:    pH, Arterial 6.939 (*)    pCO2 arterial 21.0 (*)    pO2, Arterial 174.0 (*)    Bicarbonate 4.5 (*)    Acid-base deficit 27.0 (*)    All other components within normal limits  CBG MONITORING, ED - Abnormal; Notable for the following:    Glucose-Capillary 110 (*)    All other components within normal limits  I-STAT ARTERIAL BLOOD GAS, ED - Abnormal; Notable for the following:    pH, Arterial 6.989 (*)    pCO2 arterial 18.5 (*)    pO2, Arterial 160.0 (*)    Bicarbonate 4.5 (*)    Acid-base deficit 25.0 (*)    All other components within normal limits  MRSA PCR SCREENING  CULTURE, BLOOD (ROUTINE X 2)  CULTURE, BLOOD  (ROUTINE X 2)  URINE CULTURE  URINE CULTURE  MAGNESIUM  PROCALCITONIN  STREP PNEUMONIAE URINARY ANTIGEN  MAGNESIUM  CBC WITH DIFFERENTIAL/PLATELET  TROPONIN I  CORTISOL  LEGIONELLA ANTIGEN, URINE  BLOOD GAS, ARTERIAL  I-STAT TROPOININ, ED  I-STAT CG4 LACTIC ACID, ED  CBG MONITORING, ED  TYPE AND SCREEN  ABO/RH  PREPARE FRESH FROZEN PLASMA    Imaging Review Dg Chest Portable 1 View  07/10/2014   CLINICAL DATA:  Post intubation and central line placement.  EXAM: PORTABLE CHEST - 1 VIEW  COMPARISON:  CT chest and chest radiograph 07/04/2014.  FINDINGS: Endotracheal tube terminates approximately 2.2 cm above the carina. Left IJ central line tip projects over the upper SVC. Heart size stable. There may be minimal subsegmental atelectasis at the lung bases. Lungs are low in volume. No pleural fluid. No pneumothorax.  IMPRESSION: 1. Endotracheal tube terminates approximately 2.2 cm above the carina. Retracting approximately 1 cm would better position the tip above the carina. 2. Left IJ central line placement without pneumothorax.   Electronically Signed   By: Lorin Picket M.D.   On: 06/24/2014 18:39   Dg Chest Portable 1 View  07/04/2014   CLINICAL DATA:  Trauma patient. Loss of consciousness following 2x earlier today. Patient now complaining of abdominal, back and bilateral leg pain.  EXAM: PORTABLE CHEST - 1 VIEW  COMPARISON:  03/18/2013  FINDINGS: Apparent enlargement of the cardiac silhouette and mediastinal contours, in particular, there is apparent enlargement of the ascending and descending thoracic aorta. No focal airspace opacities. No pleural effusion or  pneumothorax. No evidence of edema. No acute osseus abnormalities.  IMPRESSION: Apparent enlargement of the cardiac silhouette and mediastinal contours, possibly accentuated due to decreased lung volumes and patient rotation though a thoracic aortic aneurysm and/or dissection could have a similar appearance. Further evaluation could be  performed with PA and lateral chest radiograph and/or chest CTA as clinically indicated.  Critical Value/emergent results were called by telephone at the time of interpretation on 07/04/2014 at 3:13 pm to Dr. Debby Freiberg , who verbally acknowledged these results.   Electronically Signed   By: Sandi Mariscal M.D.   On: 07/11/2014 15:15   Ct Angio Chest Aorta W/cm &/or Wo/cm  07/04/2014   CLINICAL DATA:  At least 2 unwitnessed falls at home earlier today. Decreasing a blood pressure while in the emergency department. Acute onset abdominal pain, lumbar back pain and bilateral lower extremity pain. Surgical history includes Nissen fundoplication, light cysts of intestinal adhesions, and TURP.  EXAM: CT ANGIOGRAPHY CHEST, ABDOMEN AND PELVIS  TECHNIQUE: Multidetector CT imaging through the chest, abdomen and pelvis was performed using the standard protocol before and during bolus administration of intravenous contrast. Multiplanar reconstructed images and MIPs were obtained and reviewed to evaluate the vascular anatomy.  CONTRAST:  100 ml Omnipaque 350 IV.  It is noted that the patient has an elevated BUN of 48 and creatinine of 3.90. However, the emergency department physician felt that the current symptoms warranted the use of intravenous iodinated contrast in order to exclude an aortic dissection.  COMPARISON:  CT abdomen and pelvis 02/13/2013, 01/03/2013, 04/28/2008. CT chest 04/28/2008.  FINDINGS: CTA CHEST FINDINGS  Respiratory motion blurs images throughout the examination of the chest, rendering the examination less than optimal. Unenhanced images demonstrate no evidence of mural hematoma involving the thoracic aorta. Enhanced images demonstrate no evidence of dissection or aneurysm. Mild atherosclerosis is present involving the descending thoracic aorta. Proximal great vessels widely patent.  Heart size normal, though there may be mild left atrial enlargement. No visible coronary atherosclerosis. No pericardial  effusion. Enlarged main pulmonary arteries, the right measuring up to 3.5 cm approximately diameter and the left up to approximately 3.0 cm diameter. No convincing evidence of central pulmonary embolism, though the timing of contrast enhancement was not optimal for visualization of the pulmonary arteries.  Emphysematous changes throughout both lungs. Pulmonary parenchyma clear without localized airspace consolidation, interstitial disease, or parenchymal nodules or masses. No pleural effusions. Central airways patent.  No significant mediastinal, hilar or axillary lymphadenopathy. Thyroid gland normal in size containing numerous small nodules, the largest approximating 8 mm. Fluid throughout the esophagus. Small hiatal hernia which will be discussed below.  Bone window images demonstrate degenerative disc disease and spondylosis involving the lower cervical spine with sclerosis involving the C7 vertebral body. No sclerotic vertebrae elsewhere in the visualized lower cervical were thoracic spine. Mild osseous demineralization is suspected.  Review of the MIP images confirms the above findings.  CTA ABDOMEN AND PELVIS FINDINGS  Respiratory motion blurs many of the images of the abdomen, particularly the upper abdomen, rendering the examination less than optimal. Unenhanced images demonstrate no evidence of mural thrombus involving the upper abdominal aorta. Enhanced images demonstrate mild atherosclerosis involving the distal abdominal aorta without evidence of aneurysm or dissection. Mild left common iliac atherosclerosis. Widely patent celiac artery, SMA, and IMA. Widely patent single renal arteries bilaterally.  There is a round collection of contrast adjacent to or involving the wall of the distal esophagus or gastric cardia at the EG junction,  best seen on the coronal reformatted images, measuring approximately 1.9 x 1.3 cm. This is immediately adjacent to surgical clips placed at the time of the prior Nissen  procedure. Small to moderate sized recurrent hiatal hernia. Fluid-filled stomach which is not distended.  Severe diffuse hepatic steatosis. The liver is suboptimally imaged due to the respiratory motion, but no focal parenchymal abnormality is identified. Similarly, the spleen is unremarkable. I suspect diffuse enlargement of the pancreas and associated peripancreatic edema/inflammation. There is also edema/inflammation surrounding the descending portion of the duodenum.  Normal adrenal glands. Hydroureteronephrosis involving both kidneys without evidence of obstructing stone or mass. Severe scarring involving both kidneys. Patchy enhancement the kidneys consistent with the renal insufficiency. Cortical cysts involving both kidneys. No visible solid renal masses. No significant lymphadenopathy.  The remainder the small bowel is unremarkable. Moderate stool burden in the ascending and transverse colon, with borderline gaseous distension of the transverse colon. Descending colon, sigmoid colon and rectum relatively decompressed. Wall thickening over a several cm segment of the distal rectum to the anal verge. No free intraperitoneal air. Small amount of ascites dependently in the pelvis.  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 No visible masses at either inferior orifice to account for the bilateral hydronephrosis. 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 Phleboliths low in both sides of the pelvis.  Bone window images demonstrate degenerative disc disease and spondylosis at L1-2 and L2-3, facet degenerative changes at L4-5 and L5-S1, and bone islands in the right iliac bone.  Review of the MIP images confirms the above findings.  IMPRESSION: 1. The examination was significantly degraded by respiratory motion. 2. No evidence of thoracic or abdominal aortic aneurysm or dissection. 3. 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 4. Acute pancreatitis is suspected. 5. Acute duodenitis is also suspected. This may be secondary to the presumed pancreatitis. 6. Proctitis involving the distal rectum to the level of the anal verge. This likely explains the bright red blood per rectum. 7. Severe diffuse hepatic steatosis. 8. Bilateral hydronephrosis without obstructing stone or mass. As there is evidence of a diverticulum arising from the right lateral wall of the bladder, the hydronephrosis could be secondary to neurogenic bladder. Clinical correlation. 9. Scarring diffusely throughout both kidneys. 10. Widely patent visceral arteries. 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  No acute cardiopulmonary disease. 12. Enlarged main pulmonary arteries, measured above, consistent with pulmonary arterial hypertension. 13. Multiple subcentimeter nodules within a normal sized thyroid gland. This does not require further imaging followup. This follows ACR consensus guidelines: Managing Incidental Thyroid Nodules Detected on Imaging: White Paper of the ACR Incidental Thyroid Findings Committee. J Am Coll Radiol 2015; 12:143-150.  These results were discussed directly by telephone with Dr. Colin Rhein of the emergency department at 1625 hr and 1630 hr. She   Electronically Signed   By: Evangeline Dakin M.D.   On: 06/25/2014 16:46   Ct Cta Abd/pel W/cm &/or W/o Cm  07/02/2014   CLINICAL DATA:  At least 2 unwitnessed falls at home earlier today. Decreasing a blood pressure while in the emergency department. Acute onset abdominal pain, lumbar back pain and bilateral lower extremity pain. Surgical history includes Nissen fundoplication, light cysts of intestinal adhesions, and TURP.  EXAM: CT ANGIOGRAPHY CHEST, ABDOMEN AND PELVIS  TECHNIQUE: Multidetector CT imaging through the chest, abdomen and pelvis was performed using the standard protocol before  and during bolus administration of intravenous  contrast. Multiplanar reconstructed images and MIPs were obtained and reviewed to evaluate the vascular anatomy.  CONTRAST:  100 ml Omnipaque 350 IV.  It is noted that the patient has an elevated BUN of 48 and creatinine of 3.90. However, the emergency department physician felt that the current symptoms warranted the use of intravenous iodinated contrast in order to exclude an aortic dissection.  COMPARISON:  CT abdomen and pelvis 02/13/2013, 01/03/2013, 04/28/2008. CT chest 04/28/2008.  FINDINGS: CTA CHEST FINDINGS  Respiratory motion blurs images throughout the examination of the chest, rendering the examination less than optimal. Unenhanced images demonstrate no evidence of mural hematoma involving the thoracic aorta. Enhanced images demonstrate no evidence of dissection or aneurysm. Mild atherosclerosis is present involving the descending thoracic aorta. Proximal great vessels widely patent.  Heart size normal, though there may be mild left atrial enlargement. No visible coronary atherosclerosis. No pericardial effusion. Enlarged main pulmonary arteries, the right measuring up to 3.5 cm approximately diameter and the left up to approximately 3.0 cm diameter. No convincing evidence of central pulmonary embolism, though the timing of contrast enhancement was not optimal for visualization of the pulmonary arteries.  Emphysematous changes throughout both lungs. Pulmonary parenchyma clear without localized airspace consolidation, interstitial disease, or parenchymal nodules or masses. No pleural effusions. Central airways patent.  No significant mediastinal, hilar or axillary lymphadenopathy. Thyroid gland normal in size containing numerous small nodules, the largest approximating 8 mm. Fluid throughout the esophagus. Small hiatal hernia which will be discussed below.  Bone window images demonstrate degenerative disc disease and spondylosis involving the lower cervical spine with sclerosis involving the C7 vertebral  body. No sclerotic vertebrae elsewhere in the visualized lower cervical were thoracic spine. Mild osseous demineralization is suspected.  Review of the MIP images confirms the above findings.  CTA ABDOMEN AND PELVIS FINDINGS  Respiratory motion blurs many of the images of the abdomen, particularly the upper abdomen, rendering the examination less than optimal. Unenhanced images demonstrate no evidence of mural thrombus involving the upper abdominal aorta. Enhanced images demonstrate mild atherosclerosis involving the distal abdominal aorta without evidence of aneurysm or dissection. Mild left common iliac atherosclerosis. Widely patent celiac artery, SMA, and IMA. Widely patent single renal arteries bilaterally.  There is a round collection of contrast adjacent to or involving the wall of the distal esophagus or gastric cardia at the EG junction, best seen on the coronal reformatted images, measuring approximately 1.9 x 1.3 cm. This is immediately adjacent to surgical clips placed at the time of the prior Nissen procedure. Small to moderate sized recurrent hiatal hernia. Fluid-filled stomach which is not distended.  Severe diffuse hepatic steatosis. The liver is suboptimally imaged due to the respiratory motion, but no focal parenchymal abnormality is identified. Similarly, the spleen is unremarkable. I suspect diffuse enlargement of the pancreas and associated peripancreatic edema/inflammation. There is also edema/inflammation surrounding the descending portion of the duodenum.  Normal adrenal glands. Hydroureteronephrosis involving both kidneys without evidence of obstructing stone or mass. Severe scarring involving both kidneys. Patchy enhancement the kidneys consistent with the renal insufficiency. Cortical cysts involving both kidneys. No visible solid renal masses. No significant lymphadenopathy.  The remainder the small bowel is unremarkable. Moderate stool burden in the ascending and transverse colon, with  borderline gaseous distension of the transverse colon. Descending colon, sigmoid colon and rectum relatively decompressed. Wall thickening over a several cm segment of the distal rectum to the anal verge. No free intraperitoneal air. Small  amount of ascites dependently in the pelvis.  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 No visible masses at either inferior orifice to account for the bilateral hydronephrosis. 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 Phleboliths low in both sides of the pelvis.  Bone window images demonstrate degenerative disc disease and spondylosis at L1-2 and L2-3, facet degenerative changes at L4-5 and L5-S1, and bone islands in the right iliac bone.  Review of the MIP images confirms the above findings.  IMPRESSION: 1. The examination was significantly degraded by respiratory motion. 2. No evidence of thoracic or abdominal aortic aneurysm or dissection. 3. 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 4. Acute pancreatitis is suspected. 5. Acute duodenitis is also suspected. This may be secondary to the presumed pancreatitis. 6. Proctitis involving the distal rectum to the level of the anal verge. This likely explains the bright red blood per rectum. 7. Severe diffuse hepatic steatosis. 8. Bilateral hydronephrosis without obstructing stone or mass. As there is evidence of a diverticulum arising from the right lateral wall of the bladder, the hydronephrosis could be secondary to neurogenic bladder. Clinical correlation. 9. Scarring diffusely throughout both kidneys. 10. Widely patent visceral arteries. 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  No acute cardiopulmonary disease. 12. Enlarged main pulmonary arteries, measured above, consistent with pulmonary arterial hypertension. 13. Multiple subcentimeter nodules within a normal sized thyroid  gland. This does not require further imaging followup. This follows ACR consensus guidelines: Managing Incidental Thyroid Nodules Detected on Imaging: White Paper of the ACR Incidental Thyroid Findings Committee. J Am Coll Radiol 2015; 12:143-150.  These results were discussed directly by telephone with Dr. Colin Rhein of the emergency department at 1625 hr and 1630 hr. She   Electronically Signed   By: Evangeline Dakin M.D.   On: 07/12/2014 16:46     EKG Interpretation   Date/Time:  Saturday June 21 2014 14:41:31 EST Ventricular Rate:  103 PR Interval:    QRS Duration: 100 QT Interval:  407 QTC Calculation: 533 R Axis:   61 Text Interpretation:  Normal sinus rhythm Probable anterolateral infarct,  old Prolonged QT interval Confirmed by Debby Freiberg [phone]) on 06/25/2014  3:42:06 PM       CRITICAL CARE Performed by: Debby Freiberg   Total critical care time: 75 min  Critical care time was exclusive of separately billable procedures and treating other patients.  Critical care was necessary to treat or prevent imminent or life-threatening deterioration.  Critical care was time spent personally by me on the following activities: development of treatment plan with patient and/or surrogate as well as nursing, discussions with consultants, evaluation of patient's response to treatment, examination of patient, obtaining history from patient or surrogate, ordering and performing treatments and interventions, ordering and review of laboratory studies, ordering and review of radiographic studies, pulse oximetry and re-evaluation of patient's condition.  MDM   Final diagnoses:  Syncope and collapse  Shock  Metabolic acidosis  Abdominal pain, acute    64 y.o. male with pertinent PMH of chronic abd pain, hepatic steatosis presents with dyspnea, abd pain x 4 days.  He was seen by his GI physician for same with unremarkable wu.  He then had a syncopal episode yesterday and today.  On EMS  arrival, the pt was dyspneic, but with stable vitals.  Shortly thereafter, the pt developed hypotension to 63Z systolic. Also hypoglycemic to 20s.  1 Amp D50  He was started on albuterol for wheezing.  On arrival, the pt had hypotension, was tacypneic to 40, and was ill appearing.  Mild tachycardia was present at that time.  He had nonspecific abd pain.  Attempted [place], however unable to visualize aorta due to bowel gas.  CXR with widened mediastinum.  Lactic acid >17, and pt with metabolic acidosis, ph 1.61.   CT scan without specific pathology to explain symptoms in entirety, as above.  No dissection.  Pt placed on bipap and had improvement, however was then intolerant of the mask.  Intubated by the resident as above.  Resident also placed central line.  Critical care admission  I have reviewed all laboratory and imaging studies if ordered as above  1. Syncope and collapse   2. Dissection of aorta- Unable to delete due to association, ruled out  3. Shock   4. Metabolic acidosis   5. Abdominal pain, acute            Debby Freiberg, MD 06/20/2014 [phone]

## 2014-06-21 NOTE — ED Notes (Signed)
Madilyn Hook [phone], [phone]

## 2014-06-21 NOTE — ED Notes (Signed)
Unable to obtain blood cultures  

## 2014-06-22 ENCOUNTER — Inpatient Hospital Stay (HOSPITAL_COMMUNITY): Payer: Commercial Managed Care - HMO | Admitting: Anesthesiology

## 2014-06-22 ENCOUNTER — Encounter (HOSPITAL_COMMUNITY): Payer: Self-pay | Admitting: *Deleted

## 2014-06-22 ENCOUNTER — Inpatient Hospital Stay (HOSPITAL_COMMUNITY): Payer: Commercial Managed Care - HMO

## 2014-06-22 ENCOUNTER — Encounter (HOSPITAL_COMMUNITY): Admission: EM | Disposition: E | Payer: Self-pay | Source: Home / Self Care | Attending: Internal Medicine

## 2014-06-22 DIAGNOSIS — R579 Shock, unspecified: Secondary | ICD-10-CM

## 2014-06-22 DIAGNOSIS — E872 Acidosis, unspecified: Secondary | ICD-10-CM

## 2014-06-22 DIAGNOSIS — A419 Sepsis, unspecified organism: Principal | ICD-10-CM

## 2014-06-22 HISTORY — PX: LAPAROTOMY: SHX154

## 2014-06-22 LAB — BLOOD GAS, ARTERIAL
ACID-BASE DEFICIT: 18.3 mmol/L — AB (ref 0.0–2.0)
Bicarbonate: 7.8 mEq/L — ABNORMAL LOW (ref 20.0–24.0)
Drawn by: 347621
FIO2: 0.4 %
MECHVT: 650 mL
O2 SAT: 98 %
PATIENT TEMPERATURE: 98.6
PEEP/CPAP: 5 cmH2O
PO2 ART: 155 mmHg — AB (ref 80.0–100.0)
RATE: 35 resp/min
TCO2: 8.4 mmol/L (ref 0–100)
pCO2 arterial: 18.7 mmHg — CL (ref 35.0–45.0)
pH, Arterial: 7.244 — ABNORMAL LOW (ref 7.350–7.450)

## 2014-06-22 LAB — BASIC METABOLIC PANEL
Anion gap: 21 — ABNORMAL HIGH (ref 5–15)
Anion gap: 17 — ABNORMAL HIGH (ref 5–15)
Anion gap: 21 — ABNORMAL HIGH (ref 5–15)
Anion gap: 22 — ABNORMAL HIGH (ref 5–15)
BUN: 42 mg/dL — AB (ref 6–23)
BUN: 43 mg/dL — ABNORMAL HIGH (ref 6–23)
BUN: 42 mg/dL — ABNORMAL HIGH (ref 6–23)
BUN: 44 mg/dL — ABNORMAL HIGH (ref 6–23)
CALCIUM: 6.2 mg/dL — AB (ref 8.4–10.5)
CALCIUM: 5.6 mg/dL — AB (ref 8.4–10.5)
CALCIUM: 5.5 mg/dL — AB (ref 8.4–10.5)
CALCIUM: 5.3 mg/dL — AB (ref 8.4–10.5)
CO2: 11 mmol/L — ABNORMAL LOW (ref 19–32)
CO2: 14 mmol/L — ABNORMAL LOW (ref 19–32)
CO2: 12 mmol/L — ABNORMAL LOW (ref 19–32)
CO2: 10 mmol/L — CL (ref 19–32)
CREATININE: 3.51 mg/dL — AB (ref 0.50–1.35)
Chloride: 107 mmol/L (ref 96–112)
Chloride: 109 mmol/L (ref 96–112)
Chloride: 109 mmol/L (ref 96–112)
Chloride: 109 mmol/L (ref 96–112)
Creatinine, Ser: 3.42 mg/dL — ABNORMAL HIGH (ref 0.50–1.35)
Creatinine, Ser: 3.61 mg/dL — ABNORMAL HIGH (ref 0.50–1.35)
Creatinine, Ser: 3.27 mg/dL — ABNORMAL HIGH (ref 0.50–1.35)
GFR calc Af Amer: 20 mL/min — ABNORMAL LOW (ref >90)
GFR calc Af Amer: 19 mL/min — ABNORMAL LOW (ref >90)
GFR calc Af Amer: 20 mL/min — ABNORMAL LOW (ref >90)
GFR calc non Af Amer: 17 mL/min — ABNORMAL LOW (ref >90)
GFR, EST AFRICAN AMERICAN: 21 mL/min — AB (ref >90)
GFR, EST NON AFRICAN AMERICAN: 17 mL/min — AB (ref >90)
GFR, EST NON AFRICAN AMERICAN: 16 mL/min — AB (ref >90)
GFR, EST NON AFRICAN AMERICAN: 18 mL/min — AB (ref >90)
Glucose, Bld: 154 mg/dL — ABNORMAL HIGH (ref 70–99)
Glucose, Bld: 104 mg/dL — ABNORMAL HIGH (ref 70–99)
Glucose, Bld: 101 mg/dL — ABNORMAL HIGH (ref 70–99)
Glucose, Bld: 107 mg/dL — ABNORMAL HIGH (ref 70–99)
POTASSIUM: 5.8 mmol/L — AB (ref 3.5–5.1)
POTASSIUM: 5.7 mmol/L — AB (ref 3.5–5.1)
POTASSIUM: 5.1 mmol/L (ref 3.5–5.1)
Potassium: 4.8 mmol/L (ref 3.5–5.1)
SODIUM: 142 mmol/L (ref 135–145)
Sodium: 139 mmol/L (ref 135–145)
Sodium: 140 mmol/L (ref 135–145)
Sodium: 141 mmol/L (ref 135–145)

## 2014-06-22 LAB — PREPARE FRESH FROZEN PLASMA

## 2014-06-22 LAB — URINE MICROSCOPIC-ADD ON

## 2014-06-22 LAB — CBC
HCT: 32.8 % — ABNORMAL LOW (ref 39.0–52.0)
HCT: 31 % — ABNORMAL LOW (ref 39.0–52.0)
HEMATOCRIT: 32.4 % — AB (ref 39.0–52.0)
HEMOGLOBIN: 10.9 g/dL — AB (ref 13.0–17.0)
Hemoglobin: 11.2 g/dL — ABNORMAL LOW (ref 13.0–17.0)
Hemoglobin: 10.5 g/dL — ABNORMAL LOW (ref 13.0–17.0)
MCH: 32.7 pg (ref 26.0–34.0)
MCH: 32.8 pg (ref 26.0–34.0)
MCH: 32.9 pg (ref 26.0–34.0)
MCHC: 33.2 g/dL (ref 30.0–36.0)
MCHC: 34.6 g/dL (ref 30.0–36.0)
MCHC: 33.9 g/dL (ref 30.0–36.0)
MCV: 98.5 fL (ref 78.0–100.0)
MCV: 95 fL (ref 78.0–100.0)
MCV: 97.2 fL (ref 78.0–100.0)
PLATELETS: 61 10*3/uL — AB (ref 150–400)
PLATELETS: 46 10*3/uL — AB (ref 150–400)
Platelets: 54 10*3/uL — ABNORMAL LOW (ref 150–400)
RBC: 3.33 MIL/uL — ABNORMAL LOW (ref 4.22–5.81)
RBC: 3.41 MIL/uL — ABNORMAL LOW (ref 4.22–5.81)
RBC: 3.19 MIL/uL — ABNORMAL LOW (ref 4.22–5.81)
RDW: 16.9 % — ABNORMAL HIGH (ref 11.5–15.5)
RDW: 16.6 % — ABNORMAL HIGH (ref 11.5–15.5)
RDW: 16.3 % — AB (ref 11.5–15.5)
WBC: 12.3 10*3/uL — AB (ref 4.0–10.5)
WBC: 10 10*3/uL (ref 4.0–10.5)
WBC: 6.3 10*3/uL (ref 4.0–10.5)

## 2014-06-22 LAB — PROTIME-INR
INR: 2.11 — ABNORMAL HIGH (ref 0.00–1.49)
Prothrombin Time: 23.8 seconds — ABNORMAL HIGH (ref 11.6–15.2)

## 2014-06-22 LAB — GLUCOSE, CAPILLARY: Glucose-Capillary: 122 mg/dL — ABNORMAL HIGH (ref 70–99)

## 2014-06-22 LAB — TROPONIN I
Troponin I: 0.05 ng/mL — ABNORMAL HIGH (ref <0.031)
Troponin I: 0.05 ng/mL — ABNORMAL HIGH (ref <0.031)

## 2014-06-22 LAB — DIC (DISSEMINATED INTRAVASCULAR COAGULATION)PANEL
D-Dimer, Quant: 19.9 ug/mL-FEU — ABNORMAL HIGH (ref 0.00–0.48)
Fibrinogen: 82 mg/dL — CL (ref 204–475)
Platelets: 56 10*3/uL — ABNORMAL LOW (ref 150–400)
Smear Review: NONE SEEN

## 2014-06-22 LAB — POCT I-STAT 3, ART BLOOD GAS (G3+)
ACID-BASE DEFICIT: 13 mmol/L — AB (ref 0.0–2.0)
ACID-BASE DEFICIT: 14 mmol/L — AB (ref 0.0–2.0)
Acid-base deficit: 14 mmol/L — ABNORMAL HIGH (ref 0.0–2.0)
Bicarbonate: 10.5 mEq/L — ABNORMAL LOW (ref 20.0–24.0)
Bicarbonate: 9.7 mEq/L — ABNORMAL LOW (ref 20.0–24.0)
Bicarbonate: 9.7 mEq/L — ABNORMAL LOW (ref 20.0–24.0)
O2 Saturation: 99 %
O2 Saturation: 96 %
O2 Saturation: 99 %
PCO2 ART: 17.9 mmHg — AB (ref 35.0–45.0)
PH ART: 7.346 — AB (ref 7.350–7.450)
PH ART: 7.342 — AB (ref 7.350–7.450)
PH ART: 7.31 — AB (ref 7.350–7.450)
Patient temperature: 100.3
Patient temperature: 98.3
Patient temperature: 98.9
TCO2: 11 mmol/L (ref 0–100)
TCO2: 10 mmol/L (ref 0–100)
TCO2: 10 mmol/L (ref 0–100)
pCO2 arterial: 19.3 mmHg — CL (ref 35.0–45.0)
pCO2 arterial: 19.4 mmHg — CL (ref 35.0–45.0)
pO2, Arterial: 153 mmHg — ABNORMAL HIGH (ref 80.0–100.0)
pO2, Arterial: 85 mmHg (ref 80.0–100.0)
pO2, Arterial: 143 mmHg — ABNORMAL HIGH (ref 80.0–100.0)

## 2014-06-22 LAB — HEPATIC FUNCTION PANEL
ALT: 154 U/L — AB (ref 0–53)
AST: 182 U/L — ABNORMAL HIGH (ref 0–37)
Albumin: 2.2 g/dL — ABNORMAL LOW (ref 3.5–5.2)
Alkaline Phosphatase: 69 U/L (ref 39–117)
Bilirubin, Direct: 2.9 mg/dL — ABNORMAL HIGH (ref 0.0–0.5)
Indirect Bilirubin: 1 mg/dL — ABNORMAL HIGH (ref 0.3–0.9)
Total Bilirubin: 3.9 mg/dL — ABNORMAL HIGH (ref 0.3–1.2)
Total Protein: 4 g/dL — ABNORMAL LOW (ref 6.0–8.3)

## 2014-06-22 LAB — CARBOXYHEMOGLOBIN
CARBOXYHEMOGLOBIN: 0.2 % — AB (ref 0.5–1.5)
Methemoglobin: 1.6 % — ABNORMAL HIGH (ref 0.0–1.5)
O2 SAT: 59.6 %
Total hemoglobin: 11.1 g/dL — ABNORMAL LOW (ref 13.5–18.0)

## 2014-06-22 LAB — DIC (DISSEMINATED INTRAVASCULAR COAGULATION) PANEL
APTT: 52 s — AB (ref 24–37)
INR: 2.11 — AB (ref 0.00–1.49)
PROTHROMBIN TIME: 23.8 s — AB (ref 11.6–15.2)

## 2014-06-22 LAB — URINALYSIS, ROUTINE W REFLEX MICROSCOPIC
Glucose, UA: NEGATIVE mg/dL
KETONES UR: 15 mg/dL — AB
LEUKOCYTES UA: NEGATIVE
Nitrite: NEGATIVE
Protein, ur: 30 mg/dL — AB
SPECIFIC GRAVITY, URINE: 1.039 — AB (ref 1.005–1.030)
UROBILINOGEN UA: 1 mg/dL (ref 0.0–1.0)
pH: 5 (ref 5.0–8.0)

## 2014-06-22 LAB — LACTIC ACID, PLASMA
LACTIC ACID, VENOUS: 17.4 mmol/L — AB (ref 0.5–2.0)
Lactic Acid, Venous: 15.9 mmol/L (ref 0.5–2.0)
Lactic Acid, Venous: 13.4 mmol/L (ref 0.5–2.0)

## 2014-06-22 LAB — PHOSPHORUS: PHOSPHORUS: 6.9 mg/dL — AB (ref 2.3–4.6)

## 2014-06-22 LAB — CORTISOL
CORTISOL PLASMA: 98.6 ug/dL
Cortisol, Plasma: 129.8 ug/dL

## 2014-06-22 LAB — MRSA PCR SCREENING: MRSA BY PCR: NEGATIVE

## 2014-06-22 LAB — ALBUMIN: ALBUMIN: 1.9 g/dL — AB (ref 3.5–5.2)

## 2014-06-22 LAB — PREPARE RBC (CROSSMATCH)

## 2014-06-22 LAB — SODIUM, URINE, RANDOM: Sodium, Ur: 924 mmol/L

## 2014-06-22 LAB — LIPASE, BLOOD: LIPASE: 265 U/L — AB (ref 11–59)

## 2014-06-22 LAB — MAGNESIUM: Magnesium: 1.6 mg/dL (ref 1.5–2.5)

## 2014-06-22 LAB — CREATININE, URINE, RANDOM: Creatinine, Urine: 190.56 mg/dL

## 2014-06-22 LAB — PROCALCITONIN: PROCALCITONIN: 8.35 ng/mL

## 2014-06-22 LAB — AMYLASE: Amylase: 648 U/L — ABNORMAL HIGH (ref 0–105)

## 2014-06-22 SURGERY — LAPAROTOMY, EXPLORATORY
Anesthesia: General | Site: Abdomen

## 2014-06-22 MED ORDER — PROPOFOL 10 MG/ML IV EMUL
5.0000 ug/kg/min | INTRAVENOUS | Status: DC
  Administered 2014-06-22: 5 ug/kg/min via INTRAVENOUS

## 2014-06-22 MED ORDER — SODIUM CHLORIDE 0.9 % IV SOLN
1.0000 g | Freq: Once | INTRAVENOUS | Status: AC
  Administered 2014-06-22: 1 g via INTRAVENOUS
  Filled 2014-06-22: qty 10

## 2014-06-22 MED ORDER — NOREPINEPHRINE BITARTRATE 1 MG/ML IV SOLN
2.0000 ug/min | INTRAVENOUS | Status: DC
  Administered 2014-06-22: 5 ug/min via INTRAVENOUS
  Administered 2014-06-22: 15 ug/min via INTRAVENOUS
  Filled 2014-06-22 (×2): qty 4

## 2014-06-22 MED ORDER — ROCURONIUM BROMIDE 100 MG/10ML IV SOLN
INTRAVENOUS | Status: DC | PRN
  Administered 2014-06-22 (×2): 50 mg via INTRAVENOUS

## 2014-06-22 MED ORDER — FENTANYL BOLUS VIA INFUSION
50.0000 ug | INTRAVENOUS | Status: DC | PRN
  Administered 2014-06-25: 50 ug via INTRAVENOUS
  Filled 2014-06-22: qty 50

## 2014-06-22 MED ORDER — DEXTROSE 5 % IV SOLN
160.0000 mg | Freq: Once | INTRAVENOUS | Status: AC
  Administered 2014-06-22: 160 mg via INTRAVENOUS
  Filled 2014-06-22: qty 16

## 2014-06-22 MED ORDER — FENTANYL CITRATE 0.05 MG/ML IJ SOLN
INTRAMUSCULAR | Status: AC
  Filled 2014-06-22: qty 5

## 2014-06-22 MED ORDER — SODIUM CHLORIDE 0.9 % IV SOLN: Freq: Once | INTRAVENOUS | Status: DC

## 2014-06-22 MED ORDER — GLYCOPYRROLATE 0.2 MG/ML IJ SOLN
INTRAMUSCULAR | Status: AC
  Filled 2014-06-22: qty 1

## 2014-06-22 MED ORDER — SODIUM CHLORIDE 0.9 % IV BOLUS (SEPSIS)
500.0000 mL | Freq: Once | INTRAVENOUS | Status: AC
  Administered 2014-06-22: 500 mL via INTRAVENOUS

## 2014-06-22 MED ORDER — FENTANYL CITRATE 0.05 MG/ML IJ SOLN
50.0000 ug | Freq: Once | INTRAMUSCULAR | Status: DC
Start: 2014-06-22 — End: 2014-06-22

## 2014-06-22 MED ORDER — SODIUM CHLORIDE 0.9 % IV SOLN
25.0000 ug/h | INTRAVENOUS | Status: DC
  Administered 2014-06-22: 50 ug/h via INTRAVENOUS
  Administered 2014-06-23: 200 ug/h via INTRAVENOUS
  Filled 2014-06-22 (×3): qty 50

## 2014-06-22 MED ORDER — ONDANSETRON HCL 4 MG/2ML IJ SOLN
INTRAMUSCULAR | Status: AC
  Filled 2014-06-22: qty 2

## 2014-06-22 MED ORDER — GLYCOPYRROLATE 0.2 MG/ML IJ SOLN
INTRAMUSCULAR | Status: AC
  Filled 2014-06-22: qty 2

## 2014-06-22 MED ORDER — SODIUM CHLORIDE 0.9 % IV SOLN
Freq: Once | INTRAVENOUS | Status: AC
  Administered 2014-06-22: 13:00:00 via INTRAVENOUS

## 2014-06-22 MED ORDER — VASOPRESSIN 20 UNIT/ML IV SOLN
0.0300 [IU]/min | INTRAVENOUS | Status: DC
  Administered 2014-06-22 – 2014-06-25 (×3): 0.03 [IU]/min via INTRAVENOUS
  Filled 2014-06-22 (×5): qty 2

## 2014-06-22 MED ORDER — MIDAZOLAM HCL 5 MG/5ML IJ SOLN
INTRAMUSCULAR | Status: DC | PRN
  Administered 2014-06-22: 2 mg via INTRAVENOUS

## 2014-06-22 MED ORDER — NEOSTIGMINE METHYLSULFATE 10 MG/10ML IV SOLN
INTRAVENOUS | Status: AC
  Filled 2014-06-22: qty 1

## 2014-06-22 MED ORDER — CETYLPYRIDINIUM CHLORIDE 0.05 % MT LIQD
7.0000 mL | Freq: Four times a day (QID) | OROMUCOSAL | Status: DC
  Administered 2014-06-22 – 2014-06-26 (×16): 7 mL via OROMUCOSAL

## 2014-06-22 MED ORDER — CHLORHEXIDINE GLUCONATE 0.12 % MT SOLN
15.0000 mL | Freq: Two times a day (BID) | OROMUCOSAL | Status: DC
  Administered 2014-06-22 – 2014-06-25 (×8): 15 mL via OROMUCOSAL
  Filled 2014-06-22 (×8): qty 15

## 2014-06-22 MED ORDER — SODIUM BICARBONATE 8.4 % IV SOLN
50.0000 meq | Freq: Once | INTRAVENOUS | Status: AC
  Administered 2014-06-22: 50 meq via INTRAVENOUS
  Filled 2014-06-22: qty 50

## 2014-06-22 MED ORDER — SODIUM CHLORIDE 0.9 % IV SOLN
250.0000 mg | Freq: Four times a day (QID) | INTRAVENOUS | Status: DC
  Administered 2014-06-22 – 2014-06-24 (×7): 250 mg via INTRAVENOUS
  Filled 2014-06-22 (×11): qty 250

## 2014-06-22 MED ORDER — SODIUM CHLORIDE 0.9 % IV BOLUS (SEPSIS)
1000.0000 mL | Freq: Once | INTRAVENOUS | Status: AC
  Administered 2014-06-22: 1000 mL via INTRAVENOUS

## 2014-06-22 MED ORDER — NOREPINEPHRINE BITARTRATE 1 MG/ML IV SOLN
2.0000 ug/min | INTRAVENOUS | Status: DC
  Administered 2014-06-23: 15 ug/min via INTRAVENOUS
  Administered 2014-06-23: 20 ug/min via INTRAVENOUS
  Administered 2014-06-23: 18 ug/min via INTRAVENOUS
  Administered 2014-06-24: 50 ug/min via INTRAVENOUS
  Administered 2014-06-24: 35 ug/min via INTRAVENOUS
  Administered 2014-06-25: 30 ug/min via INTRAVENOUS
  Administered 2014-06-25: 35 ug/min via INTRAVENOUS
  Administered 2014-06-25: 25 ug/min via INTRAVENOUS
  Filled 2014-06-22 (×9): qty 16

## 2014-06-22 MED ORDER — MIDAZOLAM HCL 2 MG/2ML IJ SOLN
INTRAMUSCULAR | Status: AC
  Filled 2014-06-22: qty 2

## 2014-06-22 SURGICAL SUPPLY — 47 items
BLADE SURG ROTATE 9660 (MISCELLANEOUS) IMPLANT
BNDG GAUZE ELAST 4 BULKY (GAUZE/BANDAGES/DRESSINGS) ×1 IMPLANT
CANISTER SUCTION 2500CC (MISCELLANEOUS) ×2 IMPLANT
CHLORAPREP W/TINT 26ML (MISCELLANEOUS) ×2 IMPLANT
COVER MAYO STAND STRL (DRAPES) IMPLANT
COVER SURGICAL LIGHT HANDLE (MISCELLANEOUS) ×2 IMPLANT
DRAPE LAPAROSCOPIC ABDOMINAL (DRAPES) ×2 IMPLANT
DRAPE PROXIMA HALF (DRAPES) IMPLANT
DRAPE UTILITY XL STRL (DRAPES) ×4 IMPLANT
DRAPE WARM FLUID 44X44 (DRAPE) ×2 IMPLANT
DRSG OPSITE POSTOP 4X10 (GAUZE/BANDAGES/DRESSINGS) IMPLANT
DRSG OPSITE POSTOP 4X8 (GAUZE/BANDAGES/DRESSINGS) ×1 IMPLANT
DRSG PAD ABDOMINAL 8X10 ST (GAUZE/BANDAGES/DRESSINGS) ×1 IMPLANT
ELECT BLADE 6.5 EXT (BLADE) IMPLANT
ELECT CAUTERY BLADE 6.4 (BLADE) ×4 IMPLANT
ELECT REM PT RETURN 9FT ADLT (ELECTROSURGICAL) ×2
ELECTRODE REM PT RTRN 9FT ADLT (ELECTROSURGICAL) ×1 IMPLANT
GLOVE BIOGEL M STRL SZ7.5 (GLOVE) ×2 IMPLANT
GLOVE BIOGEL PI IND STRL 8 (GLOVE) ×1 IMPLANT
GLOVE BIOGEL PI INDICATOR 8 (GLOVE) ×1
GOWN STRL REUS W/ TWL LRG LVL3 (GOWN DISPOSABLE) ×2 IMPLANT
GOWN STRL REUS W/ TWL XL LVL3 (GOWN DISPOSABLE) ×1 IMPLANT
GOWN STRL REUS W/TWL LRG LVL3 (GOWN DISPOSABLE) ×4
GOWN STRL REUS W/TWL XL LVL3 (GOWN DISPOSABLE) ×2
KIT BASIN OR (CUSTOM PROCEDURE TRAY) ×2 IMPLANT
KIT ROOM TURNOVER OR (KITS) ×2 IMPLANT
LIGASURE IMPACT 36 18CM CVD LR (INSTRUMENTS) IMPLANT
NS IRRIG 1000ML POUR BTL (IV SOLUTION) ×4 IMPLANT
PACK GENERAL/GYN (CUSTOM PROCEDURE TRAY) ×2 IMPLANT
PAD ARMBOARD 7.5X6 YLW CONV (MISCELLANEOUS) ×2 IMPLANT
PENCIL BUTTON HOLSTER BLD 10FT (ELECTRODE) IMPLANT
SPECIMEN JAR LARGE (MISCELLANEOUS) IMPLANT
SPONGE GAUZE 4X4 12PLY STER LF (GAUZE/BANDAGES/DRESSINGS) ×1 IMPLANT
SPONGE LAP 18X18 X RAY DECT (DISPOSABLE) IMPLANT
STAPLER VISISTAT 35W (STAPLE) ×2 IMPLANT
SUCTION POOLE TIP (SUCTIONS) ×2 IMPLANT
SUT NOV 1 T60/GS (SUTURE) ×3 IMPLANT
SUT PDS AB 1 TP1 96 (SUTURE) ×4 IMPLANT
SUT SILK 2 0 SH CR/8 (SUTURE) ×2 IMPLANT
SUT SILK 2 0 TIES 10X30 (SUTURE) ×2 IMPLANT
SUT SILK 3 0 SH CR/8 (SUTURE) ×2 IMPLANT
SUT SILK 3 0 TIES 10X30 (SUTURE) ×2 IMPLANT
SUT VIC AB 3-0 SH 18 (SUTURE) IMPLANT
TOWEL OR 17X26 10 PK STRL BLUE (TOWEL DISPOSABLE) ×2 IMPLANT
TRAY FOLEY CATH 16FRSI W/METER (SET/KITS/TRAYS/PACK) IMPLANT
TUBE CONNECTING 12X1/4 (SUCTIONS) IMPLANT
YANKAUER SUCT BULB TIP NO VENT (SUCTIONS) IMPLANT

## 2014-06-22 NOTE — Progress Notes (Signed)
CRITICAL VALUE ALERT  Critical value received:  CO2 10 and Calcium 5.2  Date of notification:  .td  Time of notification:  9:59 PM  Critical value read back:Yes.    Nurse who received alert:  Mick Sell RN  MD notified (1st page):  Dr Tamala Julian    Time of first page:  10:00 PM  MD notified (2nd page):  Time of second page:  Responding MD:  Dr Tamala Julian  Time MD responded:  Dr. Tamala Julian

## 2014-06-22 NOTE — Progress Notes (Signed)
CRITICAL VALUE ALERT  Critical value received:  CO2 on ABG 18.1  Date of notification:  06/27/2014  Time of notification: 5501 Critical value read back:Yes.    Nurse who received alert:  Corrinne Eagle RN  MD notified (1st page):  Dr Smith(elink)  Time of first page:  1555  MD notified (2nd page):NA  Time of second page:  Responding MD:  Dr. Micah Flesher)  Time MD responded:  [phone]

## 2014-06-22 NOTE — Progress Notes (Signed)
Pt's BP trending down and pt indicating he is in pain. Dr. Jimmy Footman made aware.

## 2014-06-22 NOTE — Progress Notes (Signed)
CRITICAL VALUE ALERT  Critical value received: CKMB 4.5, Ca 6, LA 16.8  Date of notification:  06/19/2014  Time of notification:  0000  Critical value read back:Yes.    Nurse who received alert:  Fayrene Helper  MD notified (1st page):  Dr. Jimmy Footman  Time of first page:  0015  MD notified (2nd page):  Time of second page:  Responding MD:  Dr. Jimmy Footman  Time MD responded:  [phone]

## 2014-06-22 NOTE — Progress Notes (Signed)
eLink Physician-Brief Progress Note Patient Name: [name] DOB: [birth year] MRN: [id number]   Date of Service  07/08/2014  HPI/Events of Note  Persistent hypotension.  RT unable to placed aline.  Current BP of 77/57 (61).  CVP of 13  eICU Interventions  Plan: 500 cc fluid bolus now Order for NE for BP support     Intervention Category Intermediate Interventions: Hypotension - evaluation and management  DETERDING,ELIZABETH 07/09/2014, 6:35 AM

## 2014-06-22 NOTE — Progress Notes (Signed)
[name] is an 64 y.o. male referred by Dr Chase Caller   Chief Complaint: ARF on CKD 3 HPI: [name] with hx CKD judging by Scr of around 1.6 for last year or so was admitted 06/20/14 with abd pain x 5d and hypotension.  Had CT angio and Ct abd done with IV contrast that suggest pancreatitis and also shows bilat hydro. Has hx bil hydro 2014 and saw urology (Dr Jasmine December) and apparently due to [place] and he underwent TURP.  Renal US 06/02/14 did not show hydro.   Pt intubated and can't give hx. Since admission Scr has increased to 3.6 with decrease in UO.  Home med shows he was taking meloxicam.  Past Medical History  Diagnosis Date  . Hyperlipidemia   . HTN (hypertension)   . Allergic rhinitis   . Headache(784.0)   . Nocturia   . Urinary retention   . Foley catheter in place   . UTI (urinary tract infection)   . GERD (gastroesophageal reflux disease)   . Prediabetes   . Depression   . Arthritis   . Bell's palsy     Past Surgical History  Procedure Laterality Date  . Hernia repair      inguinal /left  . Nissen fundoplication    . Laparoscopic lysis intestinal adhesions    . Transurethral resection of prostate N/A 03/20/2013    Procedure: TRANSURETHRAL RESECTION OF THE PROSTATE WITH GYRUS INSTRUMENTS;  Surgeon: Molli Hazard, MD;  Location: [facility];  Service: Urology;  Laterality: N/A;  . Carpal tunnel release      left hand  . Nerve surgery      left arm    Family History  Problem Relation Age of Onset  . Colon cancer Mother   . Prostate cancer Father   . Arthritis Brother   . Fibromyalgia Brother    Social History:  reports that he has been passively smoking.  He quit smokeless tobacco use about 36 years ago. His smokeless tobacco use included Chew. He reports that he does not drink alcohol or use illicit drugs.  Allergies:  Allergies  Allergen Reactions  . Chloraseptic Sore Throat [Acetaminophen] Other (See Comments)    Spray burnt mouth!  . Milk-Related Compounds  Other (See Comments)    Causes gas and diarrhea    Medications Prior to Admission  Medication Sig Dispense Refill  . atenolol (TENORMIN) 100 MG tablet Take 1 tablet (100 mg total) by mouth daily. 90 tablet 1  . atorvastatin (LIPITOR) 80 MG tablet Take 1 tablet (80 mg total) by mouth 3 (three) times a week. (Patient taking differently: Take 40-80 mg by mouth 3 (three) times a week. ) 90 tablet 0  . finasteride (PROSCAR) 5 MG tablet Take 5 mg by mouth daily.     . hyoscyamine (LEVSIN, ANASPAZ) 0.125 MG tablet Take 0.125 mg by mouth every 4 (four) hours as needed for bladder spasms.    . meloxicam (MOBIC) 15 MG tablet 1/2 to 1 tablet daily with food for arthritis pain and inflammation (Patient taking differently: Take 7.5-15 mg by mouth daily. Take  with food for arthritis pain and inflammation) 90 tablet 99  . metoCLOPramide (REGLAN) 10 MG tablet Take 1 tablet (10 mg total) by mouth 4 (four) times daily -  before meals and at bedtime. 120 tablet 1  . omeprazole (PRILOSEC OTC) 20 MG tablet Take 20 mg by mouth daily.    Marland Kitchen omeprazole (PRILOSEC) 40 MG capsule Take 1 capsule (40  mg total) by mouth daily. 90 capsule 3  . oxyCODONE-acetaminophen (PERCOCET/ROXICET) 5-325 MG per tablet Take 1-2 tablets by mouth every 4 (four) hours as needed for severe pain.    . tamsulosin (FLOMAX) 0.4 MG CAPS capsule Take 0.4 mg by mouth at bedtime.     . B Complex-C (SUPER B COMPLEX PO) Take 1 tablet by mouth every morning.    . Cholecalciferol (VITAMIN D PO) Take 2,000 Int'l Units by mouth daily.    . cyclobenzaprine (FLEXERIL) 10 MG tablet Take 10 mg by mouth 3 (three) times daily as needed for muscle spasms.    . Magnesium 500 MG TABS Take 1 tablet by mouth daily.    . Multiple Vitamin (MULTIVITAMIN WITH MINERALS) TABS tablet Take 1 tablet by mouth every morning.     Marland Kitchen omega-3 acid ethyl esters (LOVAZA) 1 G capsule Take by mouth 2 (two) times daily.       Lab Results: UA: 30 prot, 3-6 rbc, 0-2 wbc   Recent  Labs  06/18/2014 2222 06/29/2014 0310 06/30/2014 1000  WBC 17.4* 12.3* 10.0  HGB 11.2* 10.9* 11.2*  HCT 33.1* 32.8* 32.4*  PLT 74* 61* 56*  54*   BMET  Recent Labs  06/24/2014 2222 07/03/2014 0310 07/04/2014 1000  NA 141 139 140  K 5.4* 5.8* 5.7*  CL 103 107 109  CO2 15* 11* 14*  GLUCOSE 415* 154* 104*  BUN 39* 42* 43*  CREATININE 3.16* 3.42* 3.61*  CALCIUM 6.0* 6.2* 5.6*  PHOS 7.0* 6.9*  --    LFT  Recent Labs  07/03/2014 0310  PROT 4.0*  ALBUMIN 2.2*  AST 182*  ALT 154*  ALKPHOS 69  BILITOT 3.9*  BILIDIR 2.9*  IBILI 1.0*   Dg Chest Port 1 View  07/01/2014   CLINICAL DATA:  Respiratory failure  EXAM: PORTABLE CHEST - 1 VIEW  COMPARISON:  07/01/2014  FINDINGS: Mild left basilar atelectasis. No focal consolidation. No pleural effusion or pneumothorax.  Endotracheal tube terminates 4.5 cm above the carina.  Left IJ venous catheter terminates in the mid SVC.  Enteric tube terminates in the gastric cardia.  IMPRESSION: No evidence of acute cardiopulmonary disease.  Endotracheal tube terminates 4.5 cm above the carina.  Additional support apparatus as above.   Electronically Signed   By: Julian Hy M.D.   On: 07/13/2014 09:52   Dg Chest Portable 1 View  07/13/2014   CLINICAL DATA:  Post intubation and central line placement.  EXAM: PORTABLE CHEST - 1 VIEW  COMPARISON:  CT chest and chest radiograph 07/02/2014.  FINDINGS: Endotracheal tube terminates approximately 2.2 cm above the carina. Left IJ central line tip projects over the upper SVC. Heart size stable. There may be minimal subsegmental atelectasis at the lung bases. Lungs are low in volume. No pleural fluid. No pneumothorax.  IMPRESSION: 1. Endotracheal tube terminates approximately 2.2 cm above the carina. Retracting approximately 1 cm would better position the tip above the carina. 2. Left IJ central line placement without pneumothorax.   Electronically Signed   By: Lorin Picket M.D.   On: 07/11/2014 18:39   Dg Chest  Portable 1 View  06/17/2014   CLINICAL DATA:  Trauma patient. Loss of consciousness following 2x earlier today. Patient now complaining of abdominal, back and bilateral leg pain.  EXAM: PORTABLE CHEST - 1 VIEW  COMPARISON:  03/18/2013  FINDINGS: Apparent enlargement of the cardiac silhouette and mediastinal contours, in particular, there is apparent enlargement of the ascending and descending thoracic  aorta. No focal airspace opacities. No pleural effusion or pneumothorax. No evidence of edema. No acute osseus abnormalities.  IMPRESSION: Apparent enlargement of the cardiac silhouette and mediastinal contours, possibly accentuated due to decreased lung volumes and patient rotation though a thoracic aortic aneurysm and/or dissection could have a similar appearance. Further evaluation could be performed with PA and lateral chest radiograph and/or chest CTA as clinically indicated.  Critical Value/emergent results were called by telephone at the time of interpretation on 07/09/2014 at 3:13 pm to Dr. Debby Freiberg , who verbally acknowledged these results.   Electronically Signed   By: Sandi Mariscal M.D.   On: 07/05/2014 15:15   Dg Abd Portable 1v  07/08/2014   CLINICAL DATA:  Evaluate nasogastric tube placement.  EXAM: PORTABLE ABDOMEN - 1 VIEW  COMPARISON:  Chest radiograph of 1 day prior  FINDINGS: Motion degraded portable supine view. Nasogastric tube is looped in the stomach, with tip at proximal gastric body. Gas within the colon. No definite small bowel distention. The right side of the abdomen is excluded.  IMPRESSION: Nasogastric tube looped in the stomach, with tip at proximal stomach.   Electronically Signed   By: Abigail Miyamoto M.D.   On: 07/12/2014 09:51   Ct Angio Chest Aorta W/cm &/or Wo/cm  07/08/2014   CLINICAL DATA:  At least 2 unwitnessed falls at home earlier today. Decreasing a blood pressure while in the emergency department. Acute onset abdominal pain, lumbar back pain and bilateral lower extremity  pain. Surgical history includes Nissen fundoplication, light cysts of intestinal adhesions, and TURP.  EXAM: CT ANGIOGRAPHY CHEST, ABDOMEN AND PELVIS  TECHNIQUE: Multidetector CT imaging through the chest, abdomen and pelvis was performed using the standard protocol before and during bolus administration of intravenous contrast. Multiplanar reconstructed images and MIPs were obtained and reviewed to evaluate the vascular anatomy.  CONTRAST:  100 ml Omnipaque 350 IV.  It is noted that the patient has an elevated BUN of 48 and creatinine of 3.90. However, the emergency department physician felt that the current symptoms warranted the use of intravenous iodinated contrast in order to exclude an aortic dissection.  COMPARISON:  CT abdomen and pelvis 02/13/2013, 01/03/2013, 04/28/2008. CT chest 04/28/2008.  FINDINGS: CTA CHEST FINDINGS  Respiratory motion blurs images throughout the examination of the chest, rendering the examination less than optimal. Unenhanced images demonstrate no evidence of mural hematoma involving the thoracic aorta. Enhanced images demonstrate no evidence of dissection or aneurysm. Mild atherosclerosis is present involving the descending thoracic aorta. Proximal great vessels widely patent.  Heart size normal, though there may be mild left atrial enlargement. No visible coronary atherosclerosis. No pericardial effusion. Enlarged main pulmonary arteries, the right measuring up to 3.5 cm approximately diameter and the left up to approximately 3.0 cm diameter. No convincing evidence of central pulmonary embolism, though the timing of contrast enhancement was not optimal for visualization of the pulmonary arteries.  Emphysematous changes throughout both lungs. Pulmonary parenchyma clear without localized airspace consolidation, interstitial disease, or parenchymal nodules or masses. No pleural effusions. Central airways patent.  No significant mediastinal, hilar or axillary lymphadenopathy. Thyroid  gland normal in size containing numerous small nodules, the largest approximating 8 mm. Fluid throughout the esophagus. Small hiatal hernia which will be discussed below.  Bone window images demonstrate degenerative disc disease and spondylosis involving the lower cervical spine with sclerosis involving the C7 vertebral body. No sclerotic vertebrae elsewhere in the visualized lower cervical were thoracic spine. Mild osseous demineralization is suspected.  Review of the MIP images confirms the above findings.  CTA ABDOMEN AND PELVIS FINDINGS  Respiratory motion blurs many of the images of the abdomen, particularly the upper abdomen, rendering the examination less than optimal. Unenhanced images demonstrate no evidence of mural thrombus involving the upper abdominal aorta. Enhanced images demonstrate mild atherosclerosis involving the distal abdominal aorta without evidence of aneurysm or dissection. Mild left common iliac atherosclerosis. Widely patent celiac artery, SMA, and IMA. Widely patent single renal arteries bilaterally.  There is a round collection of contrast adjacent to or involving the wall of the distal esophagus or gastric cardia at the EG junction, best seen on the coronal reformatted images, measuring approximately 1.9 x 1.3 cm. This is immediately adjacent to surgical clips placed at the time of the prior Nissen procedure. Small to moderate sized recurrent hiatal hernia. Fluid-filled stomach which is not distended.  Severe diffuse hepatic steatosis. The liver is suboptimally imaged due to the respiratory motion, but no focal parenchymal abnormality is identified. Similarly, the spleen is unremarkable. I suspect diffuse enlargement of the pancreas and associated peripancreatic edema/inflammation. There is also edema/inflammation surrounding the descending portion of the duodenum.  Normal adrenal glands. Hydroureteronephrosis involving both kidneys without evidence of obstructing stone or mass. Severe  scarring involving both kidneys. Patchy enhancement the kidneys consistent with the renal insufficiency. Cortical cysts involving both kidneys. No visible solid renal masses. No significant lymphadenopathy.  The remainder the small bowel is unremarkable. Moderate stool burden in the ascending and transverse colon, with borderline gaseous distension of the transverse colon. Descending colon, sigmoid colon and rectum relatively decompressed. Wall thickening over a several cm segment of the distal rectum to the anal verge. No free intraperitoneal air. Small amount of ascites dependently in the pelvis.  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 No visible masses at either inferior orifice to account for the bilateral hydronephrosis. 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 Phleboliths low in both sides of the pelvis.  Bone window images demonstrate degenerative disc disease and spondylosis at L1-2 and L2-3, facet degenerative changes at L4-5 and L5-S1, and bone islands in the right iliac bone.  Review of the MIP images confirms the above findings.  IMPRESSION: 1. The examination was significantly degraded by respiratory motion. 2. No evidence of thoracic or abdominal aortic aneurysm or dissection. 3. 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 4. Acute pancreatitis is suspected. 5. Acute duodenitis is also suspected. This may be secondary to the presumed pancreatitis. 6. Proctitis involving the distal rectum to the level of the anal verge. This likely explains the bright red blood per rectum. 7. Severe diffuse hepatic steatosis. 8. Bilateral hydronephrosis without obstructing stone or mass. As there is evidence of a diverticulum arising from the right lateral wall of the bladder, the hydronephrosis could be secondary to neurogenic bladder. Clinical  correlation. 9. Scarring diffusely throughout both kidneys. 10. Widely patent visceral arteries. 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  No acute cardiopulmonary disease. 12. Enlarged main pulmonary arteries, measured above, consistent with pulmonary arterial hypertension. 13. Multiple subcentimeter nodules within a normal sized thyroid gland. This does not require further imaging followup. This follows ACR consensus guidelines: Managing Incidental Thyroid Nodules Detected on Imaging: White Paper of the ACR Incidental Thyroid Findings Committee. J Am Coll Radiol 2015; 12:143-150.  These results were discussed directly by telephone with Dr. Colin Rhein of the emergency  department at 1625 hr and 1630 hr. She   Electronically Signed   By: Evangeline Dakin M.D.   On: 07/02/2014 16:46   Ct Cta Abd/pel W/cm &/or W/o Cm  07/02/2014   CLINICAL DATA:  At least 2 unwitnessed falls at home earlier today. Decreasing a blood pressure while in the emergency department. Acute onset abdominal pain, lumbar back pain and bilateral lower extremity pain. Surgical history includes Nissen fundoplication, light cysts of intestinal adhesions, and TURP.  EXAM: CT ANGIOGRAPHY CHEST, ABDOMEN AND PELVIS  TECHNIQUE: Multidetector CT imaging through the chest, abdomen and pelvis was performed using the standard protocol before and during bolus administration of intravenous contrast. Multiplanar reconstructed images and MIPs were obtained and reviewed to evaluate the vascular anatomy.  CONTRAST:  100 ml Omnipaque 350 IV.  It is noted that the patient has an elevated BUN of 48 and creatinine of 3.90. However, the emergency department physician felt that the current symptoms warranted the use of intravenous iodinated contrast in order to exclude an aortic dissection.  COMPARISON:  CT abdomen and pelvis 02/13/2013, 01/03/2013, 04/28/2008. CT chest 04/28/2008.  FINDINGS: CTA CHEST FINDINGS  Respiratory motion blurs images throughout the examination of the chest, rendering  the examination less than optimal. Unenhanced images demonstrate no evidence of mural hematoma involving the thoracic aorta. Enhanced images demonstrate no evidence of dissection or aneurysm. Mild atherosclerosis is present involving the descending thoracic aorta. Proximal great vessels widely patent.  Heart size normal, though there may be mild left atrial enlargement. No visible coronary atherosclerosis. No pericardial effusion. Enlarged main pulmonary arteries, the right measuring up to 3.5 cm approximately diameter and the left up to approximately 3.0 cm diameter. No convincing evidence of central pulmonary embolism, though the timing of contrast enhancement was not optimal for visualization of the pulmonary arteries.  Emphysematous changes throughout both lungs. Pulmonary parenchyma clear without localized airspace consolidation, interstitial disease, or parenchymal nodules or masses. No pleural effusions. Central airways patent.  No significant mediastinal, hilar or axillary lymphadenopathy. Thyroid gland normal in size containing numerous small nodules, the largest approximating 8 mm. Fluid throughout the esophagus. Small hiatal hernia which will be discussed below.  Bone window images demonstrate degenerative disc disease and spondylosis involving the lower cervical spine with sclerosis involving the C7 vertebral body. No sclerotic vertebrae elsewhere in the visualized lower cervical were thoracic spine. Mild osseous demineralization is suspected.  Review of the MIP images confirms the above findings.  CTA ABDOMEN AND PELVIS FINDINGS  Respiratory motion blurs many of the images of the abdomen, particularly the upper abdomen, rendering the examination less than optimal. Unenhanced images demonstrate no evidence of mural thrombus involving the upper abdominal aorta. Enhanced images demonstrate mild atherosclerosis involving the distal abdominal aorta without evidence of aneurysm or dissection. Mild left common  iliac atherosclerosis. Widely patent celiac artery, SMA, and IMA. Widely patent single renal arteries bilaterally.  There is a round collection of contrast adjacent to or involving the wall of the distal esophagus or gastric cardia at the EG junction, best seen on the coronal reformatted images, measuring approximately 1.9 x 1.3 cm. This is immediately adjacent to surgical clips placed at the time of the prior Nissen procedure. Small to moderate sized recurrent hiatal hernia. Fluid-filled stomach which is not distended.  Severe diffuse hepatic steatosis. The liver is suboptimally imaged due to the respiratory motion, but no focal parenchymal abnormality is identified. Similarly, the spleen is unremarkable. I suspect diffuse enlargement of the pancreas and associated peripancreatic  edema/inflammation. There is also edema/inflammation surrounding the descending portion of the duodenum.  Normal adrenal glands. Hydroureteronephrosis involving both kidneys without evidence of obstructing stone or mass. Severe scarring involving both kidneys. Patchy enhancement the kidneys consistent with the renal insufficiency. Cortical cysts involving both kidneys. No visible solid renal masses. No significant lymphadenopathy.  The remainder the small bowel is unremarkable. Moderate stool burden in the ascending and transverse colon, with borderline gaseous distension of the transverse colon. Descending colon, sigmoid colon and rectum relatively decompressed. Wall thickening over a several cm segment of the distal rectum to the anal verge. No free intraperitoneal air. Small amount of ascites dependently in the pelvis.  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 No visible masses at either inferior orifice to account for the bilateral hydronephrosis. 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 Phleboliths low in both sides of the pelvis.  Bone window images demonstrate degenerative disc disease and spondylosis  at L1-2 and L2-3, facet degenerative changes at L4-5 and L5-S1, and bone islands in the right iliac bone.  Review of the MIP images confirms the above findings.  IMPRESSION: 1. The examination was significantly degraded by respiratory motion. 2. No evidence of thoracic or abdominal aortic aneurysm or dissection. 3. 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 4. Acute pancreatitis is suspected. 5. Acute duodenitis is also suspected. This may be secondary to the presumed pancreatitis. 6. Proctitis involving the distal rectum to the level of the anal verge. This likely explains the bright red blood per rectum. 7. Severe diffuse hepatic steatosis. 8. Bilateral hydronephrosis without obstructing stone or mass. As there is evidence of a diverticulum arising from the right lateral wall of the bladder, the hydronephrosis could be secondary to neurogenic bladder. Clinical correlation. 9. Scarring diffusely throughout both kidneys. 10. Widely patent visceral arteries. 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  No acute cardiopulmonary disease. 12. Enlarged main pulmonary arteries, measured above, consistent with pulmonary arterial hypertension. 13. Multiple subcentimeter nodules within a normal sized thyroid gland. This does not require further imaging followup. This follows ACR consensus guidelines: Managing Incidental Thyroid Nodules Detected on Imaging: White Paper of the ACR Incidental Thyroid Findings Committee. J Am Coll Radiol 2015; 12:143-150.  These results were discussed directly by telephone with Dr. Colin Rhein of the emergency department at 1625 hr and 1630 hr. She   Electronically Signed   By: Evangeline Dakin M.D.   On: 06/27/2014 16:46    ROS: unobtainable as pt intubated  PHYSICAL EXAM: Blood pressure 81/57, pulse 78, temperature 100.5 F (38.1 C), temperature source Core (Comment), resp.  rate 35, height 6' (1.829 m), weight 94.7 kg (208 lb 12.4 oz), SpO2 100 %. HEENT: PERRLA EOMI NECK:Lt triple lumen LUNGS:Clear CARDIAC:RRR wo MRG ABD:+ BS ND, soft, Mild diffuse tenderness EXT:No CCE NEURO:CNI, he can follow commands, no asterixis  Assessment: 1. Acute on CKD 3 sec ATN from hypotension +/- contrast in face of NSAID +/- bil hydro 2. Met acidosis 3. Mild hyperkalemia 4. Hypocalcemia 5. Pancreatitis 6. . Bil hydro PLAN: 1. Suspect he will need CVVHD in next day or so, if K higher today then will start sooner 2. IV bicarb gtt 3. IV calcium as you are doing 4. CVP 17, will dose of lasix to see if can stimulate tubular flow rates 5. Urology will need to see pt to address bil hydro 6. Recheck K later  today   Teniqua Marron T 06/24/2014, 11:21 AM

## 2014-06-22 NOTE — Anesthesia Preprocedure Evaluation (Addendum)
Anesthesia Evaluation  Patient identified by MRN, date of birth, ID band Patient awake    Reviewed: Allergy & Precautions, H&P , NPO status , Patient's Chart, lab work & pertinent test results, reviewed documented beta blocker date and time   Airway Mallampati: Intubated       Dental  (+) Poor Dentition, Dental Advisory Given   Pulmonary neg pulmonary ROS, shortness of breath,  breath sounds clear to auscultation  Pulmonary exam normal       Cardiovascular hypertension, Pt. on medications and Pt. on home beta blockers Rhythm:Regular Rate:Normal     Neuro/Psych  Headaches, PSYCHIATRIC DISORDERS Depression  Neuromuscular disease negative neurological ROS     GI/Hepatic Neg liver ROS, GERD-  ,  Endo/Other  negative endocrine ROS  Renal/GU ARF and CRFRenal disease     Musculoskeletal  (+) Arthritis -,   Abdominal   Peds  Hematology negative hematology ROS (+) anemia ,   Anesthesia Other Findings Multiple broken and missing teeth. Very poor dentition.  Reproductive/Obstetrics                           Anesthesia Physical  Anesthesia Plan  ASA: V and emergent  Anesthesia Plan: General   Post-op Pain Management:    Induction: Intravenous  Airway Management Planned: Oral ETT  Additional Equipment: CVP and Arterial line  Intra-op Plan:   Post-operative Plan: Extubation in OR  Informed Consent: I have reviewed the patients History and Physical, chart, labs and discussed the procedure including the risks, benefits and alternatives for the proposed anesthesia with the patient or authorized representative who has indicated his/her understanding and acceptance.   Dental advisory given  Plan Discussed with: CRNA  Anesthesia Plan Comments:      Anesthesia Quick Evaluation

## 2014-06-22 NOTE — Consult Note (Signed)
Reason for Consult: Hydronephrosis, Acute on Chronic Renal Failure, Bilateral Renal Cysts, Prostatic Hypertrophy, Acute Abdomen   Referring Physician: Candida Peeling [name] is an 64 y.o. male.   HPI:   1 - Chronic Hydronephrosis / Ureteral Reflux - Long history of hydronephrosis with full bladder since at least 2009 ranging from mild (2009, 05/2014) to severe (02/2013 prior to TURP) suggesting ureteral reflux. Imaging this admission 07/26/14 with approximatly stable very mild hydro at time of full bladder. He had no hydro few weeks prior by ultrasound at time of empty bladder.  2 - Acute on Chronic Renal Failure - Baseline Cr about 1.7 since 2009. Cr on admission 2, up to 4 after contrast load and hypotension / pressiors, now about 3, non-oliguric. Very mild hydro as per above. Known bilateral scarrifed kidneys c/w reflux.   3 -  Bilateral Renal Cysts - bilateral non-complex <2cm renal cysts by contrast imaging x several including CT Jul 26, 2014, no frank enhancing masses.   4 -  Prostatic Hypertrophy - s/p TURP 03/2013 by Jasmine December, path benign, for BPH / obstructive voiding / severe hydro. Now minimal voiding complaints at baseline.   5 -  Acute Abdomen  - put to undergo exploratory laparotomy today by general surgery team for persistent lactic acidoses, pulmonary failure, hypotension and possible intra-abdominal acute process.   Today [name] is seen as urgent consult for above to help verify no need for urol intervention such as ureteral stenting as part of his ex-lap today.   Past Medical History  Diagnosis Date  . Hyperlipidemia   . HTN (hypertension)   . Allergic rhinitis   . Headache(784.0)   . Nocturia   . Urinary retention   . Foley catheter in place   . UTI (urinary tract infection)   . GERD (gastroesophageal reflux disease)   . Prediabetes   . Depression   . Arthritis   . Bell's palsy     Past Surgical History  Procedure Laterality Date  . Hernia repair      inguinal  /left  . Nissen fundoplication    . Laparoscopic lysis intestinal adhesions    . Transurethral resection of prostate N/A 03/20/2013    Procedure: TRANSURETHRAL RESECTION OF THE PROSTATE WITH GYRUS INSTRUMENTS;  Surgeon: Molli Hazard, MD;  Location: [facility];  Service: Urology;  Laterality: N/A;  . Carpal tunnel release      left hand  . Nerve surgery      left arm    Family History  Problem Relation Age of Onset  . Colon cancer Mother   . Prostate cancer Father   . Arthritis Brother   . Fibromyalgia Brother     Social History:  reports that he has been passively smoking.  He quit smokeless tobacco use about 36 years ago. His smokeless tobacco use included Chew. He reports that he does not drink alcohol or use illicit drugs.  Allergies:  Allergies  Allergen Reactions  . Chloraseptic Sore Throat [Acetaminophen] Other (See Comments)    Spray burnt mouth!  . Milk-Related Compounds Other (See Comments)    Causes gas and diarrhea    Medications: I have reviewed the patient's current medications.  Results for orders placed or performed during the hospital encounter of 07/13/2014 (from the past 48 hour(s))  Comprehensive metabolic panel     Status: Abnormal   Collection Time: 06/20/2014  2:45 PM  Result Value Ref Range   Sodium 140 135 - 145 mmol/L   Potassium 5.4 (  H) 3.5 - 5.1 mmol/L   Chloride 103 96 - 112 mmol/L   CO2 7 (LL) 19 - 32 mmol/L    Comment: REPEATED TO VERIFY CRITICAL RESULT CALLED TO, READ BACK BY AND VERIFIED WITH: J.DAVIS,RN 06/29/2014 @ 1711 BY V.WILKINS    Glucose, Bld 118 (H) 70 - 99 mg/dL   BUN 52 (H) 6 - 23 mg/dL   Creatinine, Ser 4.15 (H) 0.50 - 1.35 mg/dL   Calcium 8.1 (L) 8.4 - 10.5 mg/dL   Total Protein 4.2 (L) 6.0 - 8.3 g/dL   Albumin 2.5 (L) 3.5 - 5.2 g/dL   AST 191 (H) 0 - 37 U/L   ALT 228 (H) 0 - 53 U/L    Comment: RESULTS CONFIRMED BY MANUAL DILUTION   Alkaline Phosphatase 81 39 - 117 U/L   Total Bilirubin 4.4 (H) 0.3 - 1.2 mg/dL   GFR  calc non Af Amer 14 (L) >90 mL/min   GFR calc Af Amer 16 (L) >90 mL/min    Comment: (NOTE) The eGFR has been calculated using the CKD EPI equation. This calculation has not been validated in all clinical situations. eGFR's persistently <90 mL/min signify possible Chronic Kidney Disease.    Anion gap 30 (H) 5 - 15  Protime-INR     Status: Abnormal   Collection Time: 06/17/2014  2:45 PM  Result Value Ref Range   Prothrombin Time 25.9 (H) 11.6 - 15.2 seconds   INR 2.34 (H) 0.00 - 1.49  Type and screen for Red Blood Exchange     Status: None (Preliminary result)   Collection Time: 06/20/2014  2:45 PM  Result Value Ref Range   ABO/RH(D) A POS    Antibody Screen NEG    Sample Expiration 06/24/2014    Unit Number [id number]    Blood Component Type RED CELLS,LR    Unit division 00    Status of Unit ISSUED    Transfusion Status OK TO TRANSFUSE    Crossmatch Result Compatible    Unit Number [id number]    Blood Component Type RED CELLS,LR    Unit division 00    Status of Unit ISSUED    Transfusion Status OK TO TRANSFUSE    Crossmatch Result Compatible    Unit Number [id number]    Blood Component Type RED CELLS,LR    Unit division 00    Status of Unit ALLOCATED    Transfusion Status OK TO TRANSFUSE    Crossmatch Result Compatible    Unit Number [id number]    Blood Component Type RED CELLS,LR    Unit division 00    Status of Unit ALLOCATED    Transfusion Status OK TO TRANSFUSE    Crossmatch Result Compatible   ABO/Rh     Status: None   Collection Time: 07/05/2014  2:45 PM  Result Value Ref Range   ABO/RH(D) A POS   CBG monitoring, ED     Status: Abnormal   Collection Time: 06/23/2014  2:48 PM  Result Value Ref Range   Glucose-Capillary 61 (L) 70 - 99 mg/dL  I-Stat arterial blood gas, ED     Status: Abnormal   Collection Time: 07/13/2014  2:55 PM  Result Value Ref Range   pH, Arterial 7.034 (LL) 7.350 - 7.450   pCO2 arterial 12.2 (LL) 35.0 - 45.0 mmHg   pO2, Arterial  192.0 (H) 80.0 - 100.0 mmHg   Bicarbonate 3.2 (L) 20.0 - 24.0 mEq/L   TCO2 <5 0 - 100 mmol/L   O2 Saturation  99.0 %   Acid-base deficit 25.0 (H) 0.0 - 2.0 mmol/L   Patient temperature 98.6 F    Collection site RADIAL, ALLEN'S TEST ACCEPTABLE    Drawn by Operator    Sample type ARTERIAL    Comment MD NOTIFIED, REPEAT TEST   I-stat troponin, ED (not at [facility])     Status: None   Collection Time: 06/20/2014  3:01 PM  Result Value Ref Range   Troponin i, poc 0.02 0.00 - 0.08 ng/mL   Comment 3            Comment: Due to the release kinetics of cTnI, a negative result within the first hours of the onset of symptoms does not rule out myocardial infarction with certainty. If myocardial infarction is still suspected, repeat the test at appropriate intervals.   I-stat chem 8, ed     Status: Abnormal   Collection Time: 06/19/2014  3:03 PM  Result Value Ref Range   Sodium 139 135 - 145 mmol/L   Potassium 5.0 3.5 - 5.1 mmol/L   Chloride 104 96 - 112 mmol/L   BUN 48 (H) 6 - 23 mg/dL   Creatinine, Ser 3.90 (H) 0.50 - 1.35 mg/dL   Glucose, Bld 107 (H) 70 - 99 mg/dL   Calcium, Ion 0.98 (L) 1.13 - 1.30 mmol/L   TCO2 6 0 - 100 mmol/L   Hemoglobin 16.3 13.0 - 17.0 g/dL   HCT 48.0 39.0 - 52.0 %   Comment NOTIFIED PHYSICIAN   I-Stat CG4 Lactic Acid, ED     Status: Abnormal   Collection Time: 07/13/2014  3:03 PM  Result Value Ref Range   Lactic Acid, Venous >17.00 (HH) 0.5 - 2.0 mmol/L   Comment NOTIFIED PHYSICIAN   CBC with Differential/Platelet     Status: Abnormal   Collection Time: 06/24/2014  4:00 PM  Result Value Ref Range   WBC 24.7 (H) 4.0 - 10.5 K/uL    Comment: WHITE COUNT CONFIRMED ON SMEAR   RBC 4.17 (L) 4.22 - 5.81 MIL/uL   Hemoglobin 13.9 13.0 - 17.0 g/dL   HCT 42.6 39.0 - 52.0 %   MCV 102.2 (H) 78.0 - 100.0 fL   MCH 33.3 26.0 - 34.0 pg   MCHC 32.6 30.0 - 36.0 g/dL   RDW 16.4 (H) 11.5 - 15.5 %   Platelets 104 (L) 150 - 400 K/uL    Comment: PLATELET COUNT CONFIRMED BY SMEAR    Neutrophils Relative % 87 (H) 43 - 77 %   Neutro Abs 21.5 (H) 1.7 - 7.7 K/uL   Lymphocytes Relative 6 (L) 12 - 46 %   Lymphs Abs 1.5 0.7 - 4.0 K/uL   Monocytes Relative 7 3 - 12 %   Monocytes Absolute 1.7 (H) 0.1 - 1.0 K/uL   Eosinophils Relative 0 0 - 5 %   Eosinophils Absolute 0.0 0.0 - 0.7 K/uL   Basophils Relative 0 0 - 1 %   Basophils Absolute 0.0 0.0 - 0.1 K/uL   Smear Review MORPHOLOGY UNREMARKABLE   CBG monitoring, ED     Status: Abnormal   Collection Time: 06/24/2014  4:08 PM  Result Value Ref Range   Glucose-Capillary 65 (L) 70 - 99 mg/dL   Comment 1 Documented in Chart    Comment 2 Notify RN   I-Stat CG4 Lactic Acid, ED     Status: Abnormal   Collection Time: 06/28/2014  5:54 PM  Result Value Ref Range   Lactic Acid, Venous >17.00 (HH) 0.5 -  2.0 mmol/L   Comment NOTIFIED PHYSICIAN   I-Stat arterial blood gas, ED     Status: Abnormal   Collection Time: 07/01/2014  6:22 PM  Result Value Ref Range   pH, Arterial 6.939 (LL) 7.350 - 7.450   pCO2 arterial 21.0 (L) 35.0 - 45.0 mmHg   pO2, Arterial 174.0 (H) 80.0 - 100.0 mmHg   Bicarbonate 4.5 (L) 20.0 - 24.0 mEq/L   TCO2 5 0 - 100 mmol/L   O2 Saturation 98.0 %   Acid-base deficit 27.0 (H) 0.0 - 2.0 mmol/L   Patient temperature 98.6 F    Collection site RADIAL, ALLEN'S TEST ACCEPTABLE    Drawn by Operator    Sample type ARTERIAL    Comment MD NOTIFIED, REPEAT TEST   POC CBG, ED     Status: None   Collection Time: 06/18/2014  6:40 PM  Result Value Ref Range   Glucose-Capillary 98 70 - 99 mg/dL  CBG monitoring, ED     Status: Abnormal   Collection Time: 06/27/2014  6:49 PM  Result Value Ref Range   Glucose-Capillary 110 (H) 70 - 99 mg/dL  Prepare fresh frozen plasma     Status: None (Preliminary result)   Collection Time: 07/02/2014  9:00 PM  Result Value Ref Range   Unit Number [id number]    Blood Component Type THAWED PLASMA    Unit division 00    Status of Unit ISSUED,FINAL    Transfusion Status OK TO TRANSFUSE    Unit  Number [id number]    Blood Component Type THAWED PLASMA    Unit division 00    Status of Unit ISSUED    Transfusion Status OK TO TRANSFUSE   I-Stat arterial blood gas, ED     Status: Abnormal   Collection Time: 07/11/2014  9:19 PM  Result Value Ref Range   pH, Arterial 6.989 (LL) 7.350 - 7.450   pCO2 arterial 18.5 (LL) 35.0 - 45.0 mmHg   pO2, Arterial 160.0 (H) 80.0 - 100.0 mmHg   Bicarbonate 4.5 (L) 20.0 - 24.0 mEq/L   TCO2 5 0 - 100 mmol/L   O2 Saturation 98.0 %   Acid-base deficit 25.0 (H) 0.0 - 2.0 mmol/L   Patient temperature 98.6 F    Collection site RADIAL, ALLEN'S TEST ACCEPTABLE    Drawn by RT    Sample type ARTERIAL    Comment NOTIFIED PHYSICIAN   Urinalysis, Routine w reflex microscopic     Status: Abnormal   Collection Time: 07/03/2014 10:02 PM  Result Value Ref Range   Color, Urine ORANGE (A) YELLOW    Comment: BIOCHEMICALS MAY BE AFFECTED BY COLOR   APPearance CLEAR CLEAR   Specific Gravity, Urine 1.029 1.005 - 1.030   pH 5.0 5.0 - 8.0   Glucose, UA NEGATIVE NEGATIVE mg/dL   Hgb urine dipstick LARGE (A) NEGATIVE   Bilirubin Urine SMALL (A) NEGATIVE   Ketones, ur 15 (A) NEGATIVE mg/dL   Protein, ur 30 (A) NEGATIVE mg/dL   Urobilinogen, UA 1.0 0.0 - 1.0 mg/dL   Nitrite NEGATIVE NEGATIVE   Leukocytes, UA TRACE (A) NEGATIVE  Strep pneumoniae urinary antigen     Status: None   Collection Time: 07/04/2014 10:02 PM  Result Value Ref Range   Strep Pneumo Urinary Antigen NEGATIVE NEGATIVE    Comment:        Infection due to S. pneumoniae cannot be absolutely ruled out since the antigen present may be below the detection limit of the test.  Urine microscopic-add on     Status: Abnormal   Collection Time: 06/18/2014 10:02 PM  Result Value Ref Range   Squamous Epithelial / LPF FEW (A) RARE   WBC, UA 0-2 <3 WBC/hpf   RBC / HPF 0-2 <3 RBC/hpf   Bacteria, UA FEW (A) RARE   Casts HYALINE CASTS (A) NEGATIVE   Urine-Other MUCOUS PRESENT   Lipase, blood     Status:  Abnormal   Collection Time: 07/02/2014 10:22 PM  Result Value Ref Range   Lipase 359 (H) 11 - 59 U/L  Lipid panel     Status: Abnormal   Collection Time: 06/27/2014 10:22 PM  Result Value Ref Range   Cholesterol 59 0 - 200 mg/dL   Triglycerides 126 <150 mg/dL   HDL 5 (L) >39 mg/dL   Total CHOL/HDL Ratio 11.8 RATIO   VLDL 25 0 - 40 mg/dL   LDL Cholesterol 29 0 - 99 mg/dL    Comment:        Total Cholesterol/HDL:CHD Risk Coronary Heart Disease Risk Table                     Men   Women  1/2 Average Risk   3.4   3.3  Average Risk       5.0   4.4  2 X Average Risk   9.6   7.1  3 X Average Risk  23.4   11.0        Use the calculated Patient Ratio above and the CHD Risk Table to determine the patient's CHD Risk.        ATP III CLASSIFICATION (LDL):  <100     mg/dL   Optimal  100-129  mg/dL   Near or Above                    Optimal  130-159  mg/dL   Borderline  160-189  mg/dL   High  >190     mg/dL   Very High    CK total and CKMB (cardiac)     Status: Abnormal   Collection Time: 06/16/2014 10:22 PM  Result Value Ref Range   Total CK 359 (H) 7 - 232 U/L   CK, MB 14.5 (HH) 0.3 - 4.0 ng/mL    Comment: REPEATED TO VERIFY CRITICAL RESULT CALLED TO, READ BACK BY AND VERIFIED WITH: HARDUK G,RN 06/29/2014 2339 WAYK    Relative Index 4.0 (H) 0.0 - 2.5  Amylase     Status: Abnormal   Collection Time: 06/19/2014 10:22 PM  Result Value Ref Range   Amylase 723 (H) 0 - 105 U/L  Comprehensive metabolic panel     Status: Abnormal   Collection Time: 06/30/2014 10:22 PM  Result Value Ref Range   Sodium 141 135 - 145 mmol/L   Potassium 5.4 (H) 3.5 - 5.1 mmol/L   Chloride 103 96 - 112 mmol/L   CO2 15 (L) 19 - 32 mmol/L   Glucose, Bld 415 (H) 70 - 99 mg/dL   BUN 39 (H) 6 - 23 mg/dL   Creatinine, Ser 3.16 (H) 0.50 - 1.35 mg/dL   Calcium 6.0 (LL) 8.4 - 10.5 mg/dL    Comment: REPEATED TO VERIFY CRITICAL RESULT CALLED TO, READ BACK BY AND VERIFIED WITH: HARDUK G,RN 06/19/2014 2339 WAYK    Total  Protein 3.4 (L) 6.0 - 8.3 g/dL   Albumin 1.8 (L) 3.5 - 5.2 g/dL   AST 175 (H) 0 - 37  U/L   ALT 163 (H) 0 - 53 U/L   Alkaline Phosphatase 70 39 - 117 U/L   Total Bilirubin 3.6 (H) 0.3 - 1.2 mg/dL   GFR calc non Af Amer 19 (L) >90 mL/min   GFR calc Af Amer 22 (L) >90 mL/min    Comment: (NOTE) The eGFR has been calculated using the CKD EPI equation. This calculation has not been validated in all clinical situations. eGFR's persistently <90 mL/min signify possible Chronic Kidney Disease.    Anion gap 23 (H) 5 - 15  Magnesium     Status: None   Collection Time: 07/10/2014 10:22 PM  Result Value Ref Range   Magnesium 1.5 1.5 - 2.5 mg/dL  Phosphorus     Status: Abnormal   Collection Time: 06/25/2014 10:22 PM  Result Value Ref Range   Phosphorus 7.0 (H) 2.3 - 4.6 mg/dL  Troponin I     Status: Abnormal   Collection Time: 07/02/2014 10:22 PM  Result Value Ref Range   Troponin I 0.05 (H) <0.031 ng/mL    Comment:        PERSISTENTLY INCREASED TROPONIN VALUES IN THE RANGE OF 0.04-0.49 ng/mL CAN BE SEEN IN:       -UNSTABLE ANGINA       -CONGESTIVE HEART FAILURE       -MYOCARDITIS       -CHEST TRAUMA       -ARRYHTHMIAS       -LATE PRESENTING MYOCARDIAL INFARCTION       -COPD   CLINICAL FOLLOW-UP RECOMMENDED.   Lactic acid, plasma     Status: Abnormal   Collection Time: 06/20/2014 10:22 PM  Result Value Ref Range   Lactic Acid, Venous 16.8 (HH) 0.5 - 2.0 mmol/L    Comment: RESULTS CONFIRMED BY MANUAL DILUTION CRITICAL RESULT CALLED TO, READ BACK BY AND VERIFIED WITH: HARDUK G,RN 07/01/2014 2340 WAYK REPEATED TO VERIFY   Procalcitonin     Status: None   Collection Time: 06/28/2014 10:22 PM  Result Value Ref Range   Procalcitonin 4.67 ng/mL    Comment:        Interpretation: PCT > 2 ng/mL: Systemic infection (sepsis) is likely, unless other causes are known. (NOTE)         ICU PCT Algorithm               Non ICU PCT Algorithm    ----------------------------      ------------------------------         PCT < 0.25 ng/mL                 PCT < 0.1 ng/mL     Stopping of antibiotics            Stopping of antibiotics       strongly encouraged.               strongly encouraged.    ----------------------------     ------------------------------       PCT level decrease by               PCT < 0.25 ng/mL       >= 80% from peak PCT       OR PCT 0.25 - 0.5 ng/mL          Stopping of antibiotics  encouraged.     Stopping of antibiotics           encouraged.    ----------------------------     ------------------------------       PCT level decrease by              PCT >= 0.25 ng/mL       < 80% from peak PCT        AND PCT >= 0.5 ng/mL            Continuing antibiotics                                               encouraged.       Continuing antibiotics            encouraged.    ----------------------------     ------------------------------     PCT level increase compared          PCT > 0.5 ng/mL         with peak PCT AND          PCT >= 0.5 ng/mL             Escalation of antibiotics                                          strongly encouraged.      Escalation of antibiotics        strongly encouraged.   Brain natriuretic peptide     Status: Abnormal   Collection Time: 07/11/2014 10:22 PM  Result Value Ref Range   B Natriuretic Peptide 103.8 (H) 0.0 - 100.0 pg/mL  CBC WITH DIFFERENTIAL     Status: Abnormal   Collection Time: 07/05/2014 10:22 PM  Result Value Ref Range   WBC 17.4 (H) 4.0 - 10.5 K/uL   RBC 3.37 (L) 4.22 - 5.81 MIL/uL   Hemoglobin 11.2 (L) 13.0 - 17.0 g/dL    Comment: DELTA CHECK NOTED REPEATED TO VERIFY    HCT 33.1 (L) 39.0 - 52.0 %   MCV 98.2 78.0 - 100.0 fL   MCH 33.2 26.0 - 34.0 pg   MCHC 33.8 30.0 - 36.0 g/dL   RDW 16.7 (H) 11.5 - 15.5 %   Platelets 74 (L) 150 - 400 K/uL    Comment: DELTA CHECK NOTED REPEATED TO VERIFY    Neutrophils Relative % 86 (H) 43 - 77 %   Neutro Abs 15.0  (H) 1.7 - 7.7 K/uL   Lymphocytes Relative 9 (L) 12 - 46 %   Lymphs Abs 1.5 0.7 - 4.0 K/uL   Monocytes Relative 5 3 - 12 %   Monocytes Absolute 0.8 0.1 - 1.0 K/uL   Eosinophils Relative 0 0 - 5 %   Eosinophils Absolute 0.0 0.0 - 0.7 K/uL   Basophils Relative 0 0 - 1 %   Basophils Absolute 0.0 0.0 - 0.1 K/uL  Protime-INR     Status: Abnormal   Collection Time: 06/28/2014 10:22 PM  Result Value Ref Range   Prothrombin Time 27.8 (H) 11.6 - 15.2 seconds   INR 2.57 (H) 0.00 - 1.49  APTT     Status: Abnormal   Collection Time: 06/27/2014 10:22 PM  Result Value Ref Range   aPTT  66 (H) 24 - 37 seconds    Comment:        IF BASELINE aPTT IS ELEVATED, SUGGEST PATIENT RISK ASSESSMENT BE USED TO DETERMINE APPROPRIATE ANTICOAGULANT THERAPY.   Glucose, capillary     Status: Abnormal   Collection Time: 06/25/2014 11:26 PM  Result Value Ref Range   Glucose-Capillary 122 (H) 70 - 99 mg/dL   Comment 1 Notify RN   MRSA PCR Screening     Status: None   Collection Time: 06/17/2014 11:55 PM  Result Value Ref Range   MRSA by PCR NEGATIVE NEGATIVE    Comment:        The GeneXpert MRSA Assay (FDA approved for NASAL specimens only), is one component of a comprehensive MRSA colonization surveillance program. It is not intended to diagnose MRSA infection nor to guide or monitor treatment for MRSA infections.   Urinalysis, Routine w reflex microscopic     Status: Abnormal   Collection Time: 07/06/2014 11:56 PM  Result Value Ref Range   Color, Urine AMBER (A) YELLOW    Comment: BIOCHEMICALS MAY BE AFFECTED BY COLOR   APPearance CLOUDY (A) CLEAR   Specific Gravity, Urine 1.039 (H) 1.005 - 1.030   pH 5.0 5.0 - 8.0   Glucose, UA NEGATIVE NEGATIVE mg/dL   Hgb urine dipstick LARGE (A) NEGATIVE   Bilirubin Urine SMALL (A) NEGATIVE   Ketones, ur 15 (A) NEGATIVE mg/dL   Protein, ur 30 (A) NEGATIVE mg/dL   Urobilinogen, UA 1.0 0.0 - 1.0 mg/dL   Nitrite NEGATIVE NEGATIVE   Leukocytes, UA NEGATIVE NEGATIVE   Urine microscopic-add on     Status: Abnormal   Collection Time: 07/02/2014 11:56 PM  Result Value Ref Range   Squamous Epithelial / LPF FEW (A) RARE   WBC, UA 0-2 <3 WBC/hpf   RBC / HPF 3-6 <3 RBC/hpf   Bacteria, UA FEW (A) RARE   Casts GRANULAR CAST (A) NEGATIVE    Comment: HYALINE CASTS   Urine-Other AMORPHOUS URATES/PHOSPHATES   Carboxyhemoglobin     Status: Abnormal   Collection Time: 07/05/2014  3:09 AM  Result Value Ref Range   Total hemoglobin 11.1 (L) 13.5 - 18.0 g/dL   O2 Saturation 59.6 %   Carboxyhemoglobin 0.2 (L) 0.5 - 1.5 %   Methemoglobin 1.6 (H) 0.0 - 1.5 %  Troponin I     Status: Abnormal   Collection Time: 06/20/2014  3:10 AM  Result Value Ref Range   Troponin I 0.05 (H) <0.031 ng/mL    Comment:        PERSISTENTLY INCREASED TROPONIN VALUES IN THE RANGE OF 0.04-0.49 ng/mL CAN BE SEEN IN:       -UNSTABLE ANGINA       -CONGESTIVE HEART FAILURE       -MYOCARDITIS       -CHEST TRAUMA       -ARRYHTHMIAS       -LATE PRESENTING MYOCARDIAL INFARCTION       -COPD   CLINICAL FOLLOW-UP RECOMMENDED.   CBC     Status: Abnormal   Collection Time: 06/24/2014  3:10 AM  Result Value Ref Range   WBC 12.3 (H) 4.0 - 10.5 K/uL   RBC 3.33 (L) 4.22 - 5.81 MIL/uL   Hemoglobin 10.9 (L) 13.0 - 17.0 g/dL   HCT 32.8 (L) 39.0 - 52.0 %   MCV 98.5 78.0 - 100.0 fL   MCH 32.7 26.0 - 34.0 pg   MCHC 33.2 30.0 - 36.0 g/dL   RDW  16.9 (H) 11.5 - 15.5 %   Platelets 61 (L) 150 - 400 K/uL    Comment: CONSISTENT WITH PREVIOUS RESULT  Basic metabolic panel     Status: Abnormal   Collection Time: 07/06/2014  3:10 AM  Result Value Ref Range   Sodium 139 135 - 145 mmol/L   Potassium 5.8 (H) 3.5 - 5.1 mmol/L   Chloride 107 96 - 112 mmol/L   CO2 11 (L) 19 - 32 mmol/L   Glucose, Bld 154 (H) 70 - 99 mg/dL   BUN 42 (H) 6 - 23 mg/dL   Creatinine, Ser 3.42 (H) 0.50 - 1.35 mg/dL   Calcium 6.2 (LL) 8.4 - 10.5 mg/dL    Comment: REPEATED TO VERIFY CRITICAL RESULT CALLED TO, READ BACK BY AND VERIFIED  WITH: SHEPHERD B,RN 06/16/2014 0403 WAYK    GFR calc non Af Amer 17 (L) >90 mL/min   GFR calc Af Amer 20 (L) >90 mL/min    Comment: (NOTE) The eGFR has been calculated using the CKD EPI equation. This calculation has not been validated in all clinical situations. eGFR's persistently <90 mL/min signify possible Chronic Kidney Disease.    Anion gap 21 (H) 5 - 15  Magnesium     Status: None   Collection Time: 06/27/2014  3:10 AM  Result Value Ref Range   Magnesium 1.6 1.5 - 2.5 mg/dL  Phosphorus     Status: Abnormal   Collection Time: 07/04/2014  3:10 AM  Result Value Ref Range   Phosphorus 6.9 (H) 2.3 - 4.6 mg/dL  Hepatic function panel     Status: Abnormal   Collection Time: 06/16/2014  3:10 AM  Result Value Ref Range   Total Protein 4.0 (L) 6.0 - 8.3 g/dL   Albumin 2.2 (L) 3.5 - 5.2 g/dL   AST 182 (H) 0 - 37 U/L   ALT 154 (H) 0 - 53 U/L   Alkaline Phosphatase 69 39 - 117 U/L   Total Bilirubin 3.9 (H) 0.3 - 1.2 mg/dL   Bilirubin, Direct 2.9 (H) 0.0 - 0.5 mg/dL   Indirect Bilirubin 1.0 (H) 0.3 - 0.9 mg/dL  Blood gas, arterial     Status: Abnormal   Collection Time: 07/07/2014  3:51 AM  Result Value Ref Range   FIO2 0.40 %   Delivery systems VENTILATOR    Mode PRESSURE REGULATED VOLUME CONTROL    VT 650 mL   Rate 35 resp/min   Peep/cpap 5.0 cm H20   pH, Arterial 7.244 (L) 7.350 - 7.450   pCO2 arterial 18.7 (LL) 35.0 - 45.0 mmHg    Comment: CRITICAL RESULT CALLED TO, READ BACK BY AND VERIFIED WITH: FRANK MIKE,RRT AT 400, BY TRACEY ELLIS RRT ON 07/07/2014    pO2, Arterial 155.0 (H) 80.0 - 100.0 mmHg   Bicarbonate 7.8 (L) 20.0 - 24.0 mEq/L   TCO2 8.4 0 - 100 mmol/L   Acid-base deficit 18.3 (H) 0.0 - 2.0 mmol/L   O2 Saturation 98.0 %   Patient temperature 98.6    Collection site RIGHT RADIAL    Drawn by [id number]    Sample type ARTERIAL    Allens test (pass/fail) PASS PASS  Troponin I     Status: Abnormal   Collection Time: 06/17/2014  6:30 AM  Result Value Ref Range   Troponin I 0.05  (H) <0.031 ng/mL    Comment:        PERSISTENTLY INCREASED TROPONIN VALUES IN THE RANGE OF 0.04-0.49 ng/mL CAN BE SEEN IN:       -  UNSTABLE ANGINA       -CONGESTIVE HEART FAILURE       -MYOCARDITIS       -CHEST TRAUMA       -ARRYHTHMIAS       -LATE PRESENTING MYOCARDIAL INFARCTION       -COPD   CLINICAL FOLLOW-UP RECOMMENDED.   Lactic acid, plasma     Status: Abnormal   Collection Time: 07/13/2014 10:00 AM  Result Value Ref Range   Lactic Acid, Venous 15.9 (HH) 0.5 - 2.0 mmol/L    Comment: REPEATED TO VERIFY CRITICAL RESULT CALLED TO, READ BACK BY AND VERIFIED WITH: RESULTS CONFIRMED BY MANUAL DILUTION J.HOLLAND,RN 06/30/2014 _0  BY V.WILKINS   Lipase, blood     Status: Abnormal   Collection Time: 07/06/2014 10:00 AM  Result Value Ref Range   Lipase 265 (H) 11 - 59 U/L  Amylase     Status: Abnormal   Collection Time: 06/17/2014 10:00 AM  Result Value Ref Range   Amylase 648 (H) 0 - 105 U/L  Protime-INR     Status: Abnormal   Collection Time: 06/25/2014 10:00 AM  Result Value Ref Range   Prothrombin Time 23.8 (H) 11.6 - 15.2 seconds   INR 2.11 (H) 0.00 - 1.49  DIC (disseminated intravasc coag) panel     Status: Abnormal   Collection Time: 06/24/2014 10:00 AM  Result Value Ref Range   Prothrombin Time 23.8 (H) 11.6 - 15.2 seconds   INR 2.11 (H) 0.00 - 1.49   aPTT 52 (H) 24 - 37 seconds    Comment:        IF BASELINE aPTT IS ELEVATED, SUGGEST PATIENT RISK ASSESSMENT BE USED TO DETERMINE APPROPRIATE ANTICOAGULANT THERAPY.    Fibrinogen 82 (LL) 204 - 475 mg/dL    Comment: REPEATED TO VERIFY CRITICAL RESULT CALLED TO, READ BACK BY AND VERIFIED WITH: J.HOLLAND RN [id number] 06/17/2014 E.GADDY    D-Dimer, Quant 19.90 (H) 0.00 - 0.48 ug/mL-FEU    Comment:        AT THE INHOUSE ESTABLISHED CUTOFF VALUE OF 0.48 ug/mL FEU, THIS ASSAY HAS BEEN DOCUMENTED IN THE LITERATURE TO HAVE A SENSITIVITY AND NEGATIVE PREDICTIVE VALUE OF AT LEAST 98 TO 99%.  THE TEST RESULT SHOULD BE CORRELATED  WITH AN ASSESSMENT OF THE CLINICAL PROBABILITY OF DVT / VTE.    Platelets 56 (L) 150 - 400 K/uL    Comment: CONSISTENT WITH PREVIOUS RESULT   Smear Review NO SCHISTOCYTES SEEN   Procalcitonin - Baseline     Status: None   Collection Time: 06/27/2014 10:00 AM  Result Value Ref Range   Procalcitonin 8.35 ng/mL    Comment:        Interpretation: PCT > 2 ng/mL: Systemic infection (sepsis) is likely, unless other causes are known. (NOTE)         ICU PCT Algorithm               Non ICU PCT Algorithm    ----------------------------     ------------------------------         PCT < 0.25 ng/mL                 PCT < 0.1 ng/mL     Stopping of antibiotics            Stopping of antibiotics       strongly encouraged.               strongly encouraged.    ----------------------------     ------------------------------  PCT level decrease by               PCT < 0.25 ng/mL       >= 80% from peak PCT       OR PCT 0.25 - 0.5 ng/mL          Stopping of antibiotics                                             encouraged.     Stopping of antibiotics           encouraged.    ----------------------------     ------------------------------       PCT level decrease by              PCT >= 0.25 ng/mL       < 80% from peak PCT        AND PCT >= 0.5 ng/mL            Continuing antibiotics                                               encouraged.       Continuing antibiotics            encouraged.    ----------------------------     ------------------------------     PCT level increase compared          PCT > 0.5 ng/mL         with peak PCT AND          PCT >= 0.5 ng/mL             Escalation of antibiotics                                          strongly encouraged.      Escalation of antibiotics        strongly encouraged.   Basic metabolic panel     Status: Abnormal   Collection Time: 06/16/2014 10:00 AM  Result Value Ref Range   Sodium 140 135 - 145 mmol/L   Potassium 5.7 (H) 3.5 - 5.1 mmol/L    Chloride 109 96 - 112 mmol/L   CO2 14 (L) 19 - 32 mmol/L   Glucose, Bld 104 (H) 70 - 99 mg/dL   BUN 43 (H) 6 - 23 mg/dL   Creatinine, Ser 3.61 (H) 0.50 - 1.35 mg/dL   Calcium 5.6 (LL) 8.4 - 10.5 mg/dL    Comment: REPEATED TO VERIFY CRITICAL RESULT CALLED TO, READ BACK BY AND VERIFIED WITH: HOLLANDJRN [id number] MCCAULEG    GFR calc non Af Amer 16 (L) >90 mL/min   GFR calc Af Amer 19 (L) >90 mL/min    Comment: (NOTE) The eGFR has been calculated using the CKD EPI equation. This calculation has not been validated in all clinical situations. eGFR's persistently <90 mL/min signify possible Chronic Kidney Disease.    Anion gap 17 (H) 5 - 15  CBC     Status: Abnormal   Collection Time: 07/07/2014 10:00 AM  Result Value Ref Range   WBC 10.0 4.0 - 10.5 K/uL  RBC 3.41 (L) 4.22 - 5.81 MIL/uL   Hemoglobin 11.2 (L) 13.0 - 17.0 g/dL   HCT 32.4 (L) 39.0 - 52.0 %   MCV 95.0 78.0 - 100.0 fL   MCH 32.8 26.0 - 34.0 pg   MCHC 34.6 30.0 - 36.0 g/dL   RDW 16.6 (H) 11.5 - 15.5 %   Platelets 54 (L) 150 - 400 K/uL    Comment: CONSISTENT WITH PREVIOUS RESULT  I-STAT 3, arterial blood gas (G3+)     Status: Abnormal   Collection Time: 06/16/2014 11:25 AM  Result Value Ref Range   pH, Arterial 7.346 (L) 7.350 - 7.450   pCO2 arterial 19.3 (LL) 35.0 - 45.0 mmHg   pO2, Arterial 153.0 (H) 80.0 - 100.0 mmHg   Bicarbonate 10.5 (L) 20.0 - 24.0 mEq/L   TCO2 11 0 - 100 mmol/L   O2 Saturation 99.0 %   Acid-base deficit 13.0 (H) 0.0 - 2.0 mmol/L   Patient temperature 100.3 F    Collection site RADIAL, ALLEN'S TEST ACCEPTABLE    Drawn by RT    Sample type ARTERIAL    Comment NOTIFIED PHYSICIAN   Prepare fresh frozen plasma     Status: None (Preliminary result)   Collection Time: 07/09/2014 11:43 AM  Result Value Ref Range   Unit Number [id number]    Blood Component Type THAWED PLASMA    Unit division 00    Status of Unit ISSUED    Transfusion Status OK TO TRANSFUSE    Unit Number [id number]     Blood Component Type THAWED PLASMA    Unit division 00    Status of Unit ISSUED    Transfusion Status OK TO TRANSFUSE     Dg Chest Port 1 View  06/18/2014   CLINICAL DATA:  Respiratory failure  EXAM: PORTABLE CHEST - 1 VIEW  COMPARISON:  06/18/2014  FINDINGS: Mild left basilar atelectasis. No focal consolidation. No pleural effusion or pneumothorax.  Endotracheal tube terminates 4.5 cm above the carina.  Left IJ venous catheter terminates in the mid SVC.  Enteric tube terminates in the gastric cardia.  IMPRESSION: No evidence of acute cardiopulmonary disease.  Endotracheal tube terminates 4.5 cm above the carina.  Additional support apparatus as above.   Electronically Signed   By: Julian Hy M.D.   On: 07/04/2014 09:52   Dg Chest Portable 1 View  07/10/2014   CLINICAL DATA:  Post intubation and central line placement.  EXAM: PORTABLE CHEST - 1 VIEW  COMPARISON:  CT chest and chest radiograph 07/03/2014.  FINDINGS: Endotracheal tube terminates approximately 2.2 cm above the carina. Left IJ central line tip projects over the upper SVC. Heart size stable. There may be minimal subsegmental atelectasis at the lung bases. Lungs are low in volume. No pleural fluid. No pneumothorax.  IMPRESSION: 1. Endotracheal tube terminates approximately 2.2 cm above the carina. Retracting approximately 1 cm would better position the tip above the carina. 2. Left IJ central line placement without pneumothorax.   Electronically Signed   By: Lorin Picket M.D.   On: 07/03/2014 18:39   Dg Chest Portable 1 View  07/11/2014   CLINICAL DATA:  Trauma patient. Loss of consciousness following 2x earlier today. Patient now complaining of abdominal, back and bilateral leg pain.  EXAM: PORTABLE CHEST - 1 VIEW  COMPARISON:  03/18/2013  FINDINGS: Apparent enlargement of the cardiac silhouette and mediastinal contours, in particular, there is apparent enlargement of the ascending and descending thoracic aorta. No focal airspace  opacities. No pleural effusion or pneumothorax. No evidence of edema. No acute osseus abnormalities.  IMPRESSION: Apparent enlargement of the cardiac silhouette and mediastinal contours, possibly accentuated due to decreased lung volumes and patient rotation though a thoracic aortic aneurysm and/or dissection could have a similar appearance. Further evaluation could be performed with PA and lateral chest radiograph and/or chest CTA as clinically indicated.  Critical Value/emergent results were called by telephone at the time of interpretation on 07/01/2014 at 3:13 pm to Dr. Debby Freiberg , who verbally acknowledged these results.   Electronically Signed   By: Sandi Mariscal M.D.   On: 06/27/2014 15:15   Dg Abd Portable 1v  07/02/2014   CLINICAL DATA:  Evaluate nasogastric tube placement.  EXAM: PORTABLE ABDOMEN - 1 VIEW  COMPARISON:  Chest radiograph of 1 day prior  FINDINGS: Motion degraded portable supine view. Nasogastric tube is looped in the stomach, with tip at proximal gastric body. Gas within the colon. No definite small bowel distention. The right side of the abdomen is excluded.  IMPRESSION: Nasogastric tube looped in the stomach, with tip at proximal stomach.   Electronically Signed   By: Abigail Miyamoto M.D.   On: 07/07/2014 09:51   Ct Angio Chest Aorta W/cm &/or Wo/cm  07/04/2014   CLINICAL DATA:  At least 2 unwitnessed falls at home earlier today. Decreasing a blood pressure while in the emergency department. Acute onset abdominal pain, lumbar back pain and bilateral lower extremity pain. Surgical history includes Nissen fundoplication, light cysts of intestinal adhesions, and TURP.  EXAM: CT ANGIOGRAPHY CHEST, ABDOMEN AND PELVIS  TECHNIQUE: Multidetector CT imaging through the chest, abdomen and pelvis was performed using the standard protocol before and during bolus administration of intravenous contrast. Multiplanar reconstructed images and MIPs were obtained and reviewed to evaluate the vascular  anatomy.  CONTRAST:  100 ml Omnipaque 350 IV.  It is noted that the patient has an elevated BUN of 48 and creatinine of 3.90. However, the emergency department physician felt that the current symptoms warranted the use of intravenous iodinated contrast in order to exclude an aortic dissection.  COMPARISON:  CT abdomen and pelvis 02/13/2013, 01/03/2013, 04/28/2008. CT chest 04/28/2008.  FINDINGS: CTA CHEST FINDINGS  Respiratory motion blurs images throughout the examination of the chest, rendering the examination less than optimal. Unenhanced images demonstrate no evidence of mural hematoma involving the thoracic aorta. Enhanced images demonstrate no evidence of dissection or aneurysm. Mild atherosclerosis is present involving the descending thoracic aorta. Proximal great vessels widely patent.  Heart size normal, though there may be mild left atrial enlargement. No visible coronary atherosclerosis. No pericardial effusion. Enlarged main pulmonary arteries, the right measuring up to 3.5 cm approximately diameter and the left up to approximately 3.0 cm diameter. No convincing evidence of central pulmonary embolism, though the timing of contrast enhancement was not optimal for visualization of the pulmonary arteries.  Emphysematous changes throughout both lungs. Pulmonary parenchyma clear without localized airspace consolidation, interstitial disease, or parenchymal nodules or masses. No pleural effusions. Central airways patent.  No significant mediastinal, hilar or axillary lymphadenopathy. Thyroid gland normal in size containing numerous small nodules, the largest approximating 8 mm. Fluid throughout the esophagus. Small hiatal hernia which will be discussed below.  Bone window images demonstrate degenerative disc disease and spondylosis involving the lower cervical spine with sclerosis involving the C7 vertebral body. No sclerotic vertebrae elsewhere in the visualized lower cervical were thoracic spine. Mild  osseous demineralization is suspected.  Review of the MIP  images confirms the above findings.  CTA ABDOMEN AND PELVIS FINDINGS  Respiratory motion blurs many of the images of the abdomen, particularly the upper abdomen, rendering the examination less than optimal. Unenhanced images demonstrate no evidence of mural thrombus involving the upper abdominal aorta. Enhanced images demonstrate mild atherosclerosis involving the distal abdominal aorta without evidence of aneurysm or dissection. Mild left common iliac atherosclerosis. Widely patent celiac artery, SMA, and IMA. Widely patent single renal arteries bilaterally.  There is a round collection of contrast adjacent to or involving the wall of the distal esophagus or gastric cardia at the EG junction, best seen on the coronal reformatted images, measuring approximately 1.9 x 1.3 cm. This is immediately adjacent to surgical clips placed at the time of the prior Nissen procedure. Small to moderate sized recurrent hiatal hernia. Fluid-filled stomach which is not distended.  Severe diffuse hepatic steatosis. The liver is suboptimally imaged due to the respiratory motion, but no focal parenchymal abnormality is identified. Similarly, the spleen is unremarkable. I suspect diffuse enlargement of the pancreas and associated peripancreatic edema/inflammation. There is also edema/inflammation surrounding the descending portion of the duodenum.  Normal adrenal glands. Hydroureteronephrosis involving both kidneys without evidence of obstructing stone or mass. Severe scarring involving both kidneys. Patchy enhancement the kidneys consistent with the renal insufficiency. Cortical cysts involving both kidneys. No visible solid renal masses. No significant lymphadenopathy.  The remainder the small bowel is unremarkable. Moderate stool burden in the ascending and transverse colon, with borderline gaseous distension of the transverse colon. Descending colon, sigmoid colon and rectum  relatively decompressed. Wall thickening over a several cm segment of the distal rectum to the anal verge. No free intraperitoneal air. Small amount of ascites dependently in the pelvis.  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 No visible masses at either inferior orifice to account for the bilateral hydronephrosis. 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 Phleboliths low in both sides of the pelvis.  Bone window images demonstrate degenerative disc disease and spondylosis at L1-2 and L2-3, facet degenerative changes at L4-5 and L5-S1, and bone islands in the right iliac bone.  Review of the MIP images confirms the above findings.  IMPRESSION: 1. The examination was significantly degraded by respiratory motion. 2. No evidence of thoracic or abdominal aortic aneurysm or dissection. 3. 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 4. Acute pancreatitis is suspected. 5. Acute duodenitis is also suspected. This may be secondary to the presumed pancreatitis. 6. Proctitis involving the distal rectum to the level of the anal verge. This likely explains the bright red blood per rectum. 7. Severe diffuse hepatic steatosis. 8. Bilateral hydronephrosis without obstructing stone or mass. As there is evidence of a diverticulum arising from the right lateral wall of the bladder, the hydronephrosis could be secondary to neurogenic bladder. Clinical correlation. 9. Scarring diffusely throughout both kidneys. 10. Widely patent visceral arteries. 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  No acute cardiopulmonary disease. 12. Enlarged main pulmonary arteries, measured above, consistent with pulmonary arterial hypertension. 13. Multiple subcentimeter nodules within a normal sized thyroid gland. This does not require further imaging followup. This follows ACR consensus guidelines: Managing  Incidental Thyroid Nodules Detected on Imaging: White Paper of the ACR Incidental Thyroid Findings Committee. J Am Coll Radiol 2015; 12:143-150.  These results were discussed directly by telephone with Dr. Colin Rhein of the emergency department at 1625 hr  and 1630 hr. She   Electronically Signed   By: Evangeline Dakin M.D.   On: 06/28/2014 16:46   Ct Cta Abd/pel W/cm &/or W/o Cm  07/08/2014   CLINICAL DATA:  At least 2 unwitnessed falls at home earlier today. Decreasing a blood pressure while in the emergency department. Acute onset abdominal pain, lumbar back pain and bilateral lower extremity pain. Surgical history includes Nissen fundoplication, light cysts of intestinal adhesions, and TURP.  EXAM: CT ANGIOGRAPHY CHEST, ABDOMEN AND PELVIS  TECHNIQUE: Multidetector CT imaging through the chest, abdomen and pelvis was performed using the standard protocol before and during bolus administration of intravenous contrast. Multiplanar reconstructed images and MIPs were obtained and reviewed to evaluate the vascular anatomy.  CONTRAST:  100 ml Omnipaque 350 IV.  It is noted that the patient has an elevated BUN of 48 and creatinine of 3.90. However, the emergency department physician felt that the current symptoms warranted the use of intravenous iodinated contrast in order to exclude an aortic dissection.  COMPARISON:  CT abdomen and pelvis 02/13/2013, 01/03/2013, 04/28/2008. CT chest 04/28/2008.  FINDINGS: CTA CHEST FINDINGS  Respiratory motion blurs images throughout the examination of the chest, rendering the examination less than optimal. Unenhanced images demonstrate no evidence of mural hematoma involving the thoracic aorta. Enhanced images demonstrate no evidence of dissection or aneurysm. Mild atherosclerosis is present involving the descending thoracic aorta. Proximal great vessels widely patent.  Heart size normal, though there may be mild left atrial enlargement. No visible coronary atherosclerosis. No  pericardial effusion. Enlarged main pulmonary arteries, the right measuring up to 3.5 cm approximately diameter and the left up to approximately 3.0 cm diameter. No convincing evidence of central pulmonary embolism, though the timing of contrast enhancement was not optimal for visualization of the pulmonary arteries.  Emphysematous changes throughout both lungs. Pulmonary parenchyma clear without localized airspace consolidation, interstitial disease, or parenchymal nodules or masses. No pleural effusions. Central airways patent.  No significant mediastinal, hilar or axillary lymphadenopathy. Thyroid gland normal in size containing numerous small nodules, the largest approximating 8 mm. Fluid throughout the esophagus. Small hiatal hernia which will be discussed below.  Bone window images demonstrate degenerative disc disease and spondylosis involving the lower cervical spine with sclerosis involving the C7 vertebral body. No sclerotic vertebrae elsewhere in the visualized lower cervical were thoracic spine. Mild osseous demineralization is suspected.  Review of the MIP images confirms the above findings.  CTA ABDOMEN AND PELVIS FINDINGS  Respiratory motion blurs many of the images of the abdomen, particularly the upper abdomen, rendering the examination less than optimal. Unenhanced images demonstrate no evidence of mural thrombus involving the upper abdominal aorta. Enhanced images demonstrate mild atherosclerosis involving the distal abdominal aorta without evidence of aneurysm or dissection. Mild left common iliac atherosclerosis. Widely patent celiac artery, SMA, and IMA. Widely patent single renal arteries bilaterally.  There is a round collection of contrast adjacent to or involving the wall of the distal esophagus or gastric cardia at the EG junction, best seen on the coronal reformatted images, measuring approximately 1.9 x 1.3 cm. This is immediately adjacent to surgical clips placed at the time of the  prior Nissen procedure. Small to moderate sized recurrent hiatal hernia. Fluid-filled stomach which is not distended.  Severe diffuse hepatic steatosis. The liver is suboptimally imaged due to the respiratory motion, but no focal parenchymal abnormality is identified. Similarly, the spleen is unremarkable. I suspect diffuse enlargement of the pancreas and associated peripancreatic edema/inflammation. There is also  edema/inflammation surrounding the descending portion of the duodenum.  Normal adrenal glands. Hydroureteronephrosis involving both kidneys without evidence of obstructing stone or mass. Severe scarring involving both kidneys. Patchy enhancement the kidneys consistent with the renal insufficiency. Cortical cysts involving both kidneys. No visible solid renal masses. No significant lymphadenopathy.  The remainder the small bowel is unremarkable. Moderate stool burden in the ascending and transverse colon, with borderline gaseous distension of the transverse colon. Descending colon, sigmoid colon and rectum relatively decompressed. Wall thickening over a several cm segment of the distal rectum to the anal verge. No free intraperitoneal air. Small amount of ascites dependently in the pelvis.  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 No visible masses at either inferior orifice to account for the bilateral hydronephrosis. 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 Phleboliths low in both sides of the pelvis.  Bone window images demonstrate degenerative disc disease and spondylosis at L1-2 and L2-3, facet degenerative changes at L4-5 and L5-S1, and bone islands in the right iliac bone.  Review of the MIP images confirms the above findings.  IMPRESSION: 1. The examination was significantly degraded by respiratory motion. 2. No evidence of thoracic or abdominal aortic aneurysm or dissection. 3. 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 4. Acute pancreatitis is suspected. 5. Acute duodenitis is also suspected. This may be secondary to the presumed pancreatitis. 6. Proctitis involving the distal rectum to the level of the anal verge. This likely explains the bright red blood per rectum. 7. Severe diffuse hepatic steatosis. 8. Bilateral hydronephrosis without obstructing stone or mass. As there is evidence of a diverticulum arising from the right lateral wall of the bladder, the hydronephrosis could be secondary to neurogenic bladder. Clinical correlation. 9. Scarring diffusely throughout both kidneys. 10. Widely patent visceral arteries. 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  No acute cardiopulmonary disease. 12. Enlarged main pulmonary arteries, measured above, consistent with pulmonary arterial hypertension. 13. Multiple subcentimeter nodules within a normal sized thyroid gland. This does not require further imaging followup. This follows ACR consensus guidelines: Managing Incidental Thyroid Nodules Detected on Imaging: White Paper of the ACR Incidental Thyroid Findings Committee. J Am Coll Radiol 2015; 12:143-150.  These results were discussed directly by telephone with Dr. Colin Rhein of the emergency department at 1625 hr and 1630 hr. She   Electronically Signed   By: Evangeline Dakin M.D.   On: 07/09/2014 16:46    Review of Systems  Constitutional: Positive for malaise/fatigue.  HENT: Negative.   Eyes: Negative.   Respiratory: Positive for shortness of breath and wheezing.   Gastrointestinal: Positive for abdominal pain.  Genitourinary: Negative for frequency, hematuria and flank pain.  Musculoskeletal: Negative.   Neurological: Negative.   Endo/Heme/Allergies: Negative.    Blood pressure 87/66, pulse 81, temperature 100 F (37.8 C), temperature source Core (Comment), resp. rate 35, height 6' (1.829 m), weight 94.7 kg (208 lb 12.4 oz), SpO2 100 %. Physical  Exam  Constitutional:  Ill appearing, in ICU 58M, intubated. Preparing to go to surgery for exploratory laparotomy  HENT:  ETT in place  Eyes: Pupils are equal, round, and reactive to light.  Neck: Normal range of motion.  Cardiovascular:  Tachycardic by bedside monitor  GI: There is tenderness.  Genitourinary:  Foley c/d/i with yellow urine. No CVAT  Musculoskeletal: Normal range of motion.  Neurological: He is alert.  Skin: Skin is  warm.  Psychiatric: He has a normal mood and affect. His behavior is normal. Judgment and thought content normal.    Assessment/Plan:   1 - Chronic Hydronephrosis / Ureteral Reflux - appears at baseline by imaging and very likely non-obstructive and related rather to ureteral reflux. Rec keep foley in place until GFR improves.   2 - Acute on Chronic Renal Failure - GFR very close to baseline at admission. Likely multifactorial with significant baseline medical renal disease, pre-renal hypotention, contrast dye load. Do not favor significant obstructive component. Again, only rec keep foley until GFR recovery.   3 -  Bilateral Renal Cysts - no worrisome masses, no indication for further dedicated surveillance.    4 -  Prostatic Hypertrophy - now no bother s/p TURP, observe.   5 -  Acute Abdomen  - no indication for GU procedures during ex-lap today.  Will follow prn while in house, call anytime with questions.    Alecia Doi 06/27/2014, 1:00 PM

## 2014-06-22 NOTE — Progress Notes (Signed)
CRITICAL VALUE ALERT  Critical value received:  Lactic Acid 13.4  Date of notification:  06/25/2014  Time of notification:  1656  Critical value read back:Yes.    Nurse who received alert:  Corrinne Eagle RN  MD notified (1st page):  Dr Micah Flesher MD  Time of first page:  [phone]  MD notified (2nd page):  Time of second page:  Responding MD:  Dr. Tamala Julian  Time MD responded:  [phone]

## 2014-06-22 NOTE — Progress Notes (Signed)
CRITICAL VALUE ALERT  Critical value received:  Calcium 5.5  Date of notification:  07/03/2014  Time of notification:  2119  Critical value read back:Yes.    Nurse who received alert:  Corrinne Eagle RN  MD notified (1st page):  Dr. Tamala Julian (elink)  Time of first page:  1720(called to md elink)  MD notified (2nd page):  Time of second page:  Responding MD:  Tamala Julian  Time MD responded:  [phone]

## 2014-06-22 NOTE — Progress Notes (Signed)
eLink Physician-Brief Progress Note Patient Name: [name] DOB: 01-Feb-1951 MRN: [id number]   Date of Service  07/04/2014  HPI/Events of Note  Hypotension in patient with elevated lactate and concern for acute pancreatitis.  Had been normotensive on no pressors earlier in PM.  Now with BP 60 -80/ 40-50  eICU Interventions  Plan: 500 cc bolus NS for BP support     Intervention Category Intermediate Interventions: Hypotension - evaluation and management  DETERDING,ELIZABETH 06/23/2014, 5:23 AM

## 2014-06-22 NOTE — Progress Notes (Signed)
CRITICAL VALUE ALERT  Critical value received:  fibrinogen  Date of notification:  06/20/2014  Time of notification:  1138  Critical value read back:Yes.    Nurse who received alert:  Corrinne Eagle RN  MD notified (1st page):  Dr. Chase Caller  Time of first page:  1200(told in person on unit)  MD notified (2nd page):  Time of second page:  Responding MD:  Chase Caller  Time MD responded:  1200

## 2014-06-22 NOTE — Progress Notes (Signed)
CRITICAL VALUE ALERT  Critical value received:  Lactic Acid 15.9  Date of notification:  06/27/2014  Time of notification:  1220  Critical value read back:Yes.    Nurse who received alert:  Corrinne Eagle RN  MD notified (1st page):  Dr. Janelle Floor value in person to Dr. Chase Caller at 1225  Time of first page: NA  MD notified (2nd page):NA  Time of second page:  Responding MD:  Chase Caller  Time MD responded:  1225

## 2014-06-22 NOTE — Progress Notes (Signed)
Attempted Art line but unable to thread catheter. Assisted by Maricela Bo, RRT,RCP.

## 2014-06-22 NOTE — Progress Notes (Signed)
Subjective: Intubated. But follow. Still on vasopressor   Objective: Vital signs in last 24 hours: Temp:  [93.6 F (34.2 C)-100 F (37.8 C)] 100 F (37.8 C) (02/07 0700) Pulse Rate:  [71-108] 77 (02/07 0832) Resp:  [23-42] 35 (02/07 0832) BP: (52-138)/(38-101) 73/52 mmHg (02/07 0832) SpO2:  [95 %-100 %] 100 % (02/07 0832) FiO2 (%):  [40 %-50 %] 40 % (02/07 0832) Weight:  [208 lb 12.4 oz (94.7 kg)] 208 lb 12.4 oz (94.7 kg) (02/07 0341) Last BM Date:  (pta)  Intake/Output from previous day: 02/06 0701 - 02/07 0700 In: 6712.2 [I.V.:5232.2; Blood:380; IV Piggyback:1100] Out: 955 [Urine:955] Intake/Output this shift: Total I/O In: 313.7 [I.V.:313.7] Out: 55 [Urine:55]  Intubated. Follows commands. Nods head to ? About worsening abd pain.  Coarse BS Tachy Soft, mild distension, diffusely TTP, some guarding +SCDs  Lab Results:   Recent Labs  06/18/2014 2222 06/28/2014 0310  WBC 17.4* 12.3*  HGB 11.2* 10.9*  HCT 33.1* 32.8*  PLT 74* 61*   BMET  Recent Labs  07/03/2014 2222 06/18/2014 0310  NA 141 139  K 5.4* 5.8*  CL 103 107  CO2 15* 11*  GLUCOSE 415* 154*  BUN 39* 42*  CREATININE 3.16* 3.42*  CALCIUM 6.0* 6.2*   Hepatic Function Latest Ref Rng 07/07/2014 06/27/2014 07/05/2014  Total Protein 6.0 - 8.3 g/dL 4.0(L) 3.4(L) 4.2(L)  Albumin 3.5 - 5.2 g/dL 2.2(L) 1.8(L) 2.5(L)  AST 0 - 37 U/L 182(H) 175(H) 191(H)  ALT 0 - 53 U/L 154(H) 163(H) 228(H)  Alk Phosphatase 39 - 117 U/L 69 70 81  Total Bilirubin 0.3 - 1.2 mg/dL 3.9(H) 3.6(H) 4.4(H)  Bilirubin, Direct 0.0 - 0.5 mg/dL 2.9(H) - -     PT/INR  Recent Labs  07/04/2014 1445 06/28/2014 2222  LABPROT 25.9* 27.8*  INR 2.34* 2.57*   ABG  Recent Labs  06/16/2014 2119 06/16/2014 0351  PHART 6.989* 7.244*  HCO3 4.5* 7.8*    Studies/Results: Dg Chest Portable 1 View  07/03/2014   CLINICAL DATA:  Post intubation and central line placement.  EXAM: PORTABLE CHEST - 1 VIEW  COMPARISON:  CT chest and chest radiograph  06/30/2014.  FINDINGS: Endotracheal tube terminates approximately 2.2 cm above the carina. Left IJ central line tip projects over the upper SVC. Heart size stable. There may be minimal subsegmental atelectasis at the lung bases. Lungs are low in volume. No pleural fluid. No pneumothorax.  IMPRESSION: 1. Endotracheal tube terminates approximately 2.2 cm above the carina. Retracting approximately 1 cm would better position the tip above the carina. 2. Left IJ central line placement without pneumothorax.   Electronically Signed   By: Lorin Picket M.D.   On: 06/16/2014 18:39   Dg Chest Portable 1 View  07/13/2014   CLINICAL DATA:  Trauma patient. Loss of consciousness following 2x earlier today. Patient now complaining of abdominal, back and bilateral leg pain.  EXAM: PORTABLE CHEST - 1 VIEW  COMPARISON:  03/18/2013  FINDINGS: Apparent enlargement of the cardiac silhouette and mediastinal contours, in particular, there is apparent enlargement of the ascending and descending thoracic aorta. No focal airspace opacities. No pleural effusion or pneumothorax. No evidence of edema. No acute osseus abnormalities.  IMPRESSION: Apparent enlargement of the cardiac silhouette and mediastinal contours, possibly accentuated due to decreased lung volumes and patient rotation though a thoracic aortic aneurysm and/or dissection could have a similar appearance. Further evaluation could be performed with PA and lateral chest radiograph and/or chest CTA as clinically indicated.  Critical  Value/emergent results were called by telephone at the time of interpretation on 06/18/2014 at 3:13 pm to Dr. Debby Freiberg , who verbally acknowledged these results.   Electronically Signed   By: Sandi Mariscal M.D.   On: 06/20/2014 15:15   Ct Angio Chest Aorta W/cm &/or Wo/cm  07/12/2014   CLINICAL DATA:  At least 2 unwitnessed falls at home earlier today. Decreasing a blood pressure while in the emergency department. Acute onset abdominal pain,  lumbar back pain and bilateral lower extremity pain. Surgical history includes Nissen fundoplication, light cysts of intestinal adhesions, and TURP.  EXAM: CT ANGIOGRAPHY CHEST, ABDOMEN AND PELVIS  TECHNIQUE: Multidetector CT imaging through the chest, abdomen and pelvis was performed using the standard protocol before and during bolus administration of intravenous contrast. Multiplanar reconstructed images and MIPs were obtained and reviewed to evaluate the vascular anatomy.  CONTRAST:  100 ml Omnipaque 350 IV.  It is noted that the patient has an elevated BUN of 48 and creatinine of 3.90. However, the emergency department physician felt that the current symptoms warranted the use of intravenous iodinated contrast in order to exclude an aortic dissection.  COMPARISON:  CT abdomen and pelvis 02/13/2013, 01/03/2013, 04/28/2008. CT chest 04/28/2008.  FINDINGS: CTA CHEST FINDINGS  Respiratory motion blurs images throughout the examination of the chest, rendering the examination less than optimal. Unenhanced images demonstrate no evidence of mural hematoma involving the thoracic aorta. Enhanced images demonstrate no evidence of dissection or aneurysm. Mild atherosclerosis is present involving the descending thoracic aorta. Proximal great vessels widely patent.  Heart size normal, though there may be mild left atrial enlargement. No visible coronary atherosclerosis. No pericardial effusion. Enlarged main pulmonary arteries, the right measuring up to 3.5 cm approximately diameter and the left up to approximately 3.0 cm diameter. No convincing evidence of central pulmonary embolism, though the timing of contrast enhancement was not optimal for visualization of the pulmonary arteries.  Emphysematous changes throughout both lungs. Pulmonary parenchyma clear without localized airspace consolidation, interstitial disease, or parenchymal nodules or masses. No pleural effusions. Central airways patent.  No significant  mediastinal, hilar or axillary lymphadenopathy. Thyroid gland normal in size containing numerous small nodules, the largest approximating 8 mm. Fluid throughout the esophagus. Small hiatal hernia which will be discussed below.  Bone window images demonstrate degenerative disc disease and spondylosis involving the lower cervical spine with sclerosis involving the C7 vertebral body. No sclerotic vertebrae elsewhere in the visualized lower cervical were thoracic spine. Mild osseous demineralization is suspected.  Review of the MIP images confirms the above findings.  CTA ABDOMEN AND PELVIS FINDINGS  Respiratory motion blurs many of the images of the abdomen, particularly the upper abdomen, rendering the examination less than optimal. Unenhanced images demonstrate no evidence of mural thrombus involving the upper abdominal aorta. Enhanced images demonstrate mild atherosclerosis involving the distal abdominal aorta without evidence of aneurysm or dissection. Mild left common iliac atherosclerosis. Widely patent celiac artery, SMA, and IMA. Widely patent single renal arteries bilaterally.  There is a round collection of contrast adjacent to or involving the wall of the distal esophagus or gastric cardia at the EG junction, best seen on the coronal reformatted images, measuring approximately 1.9 x 1.3 cm. This is immediately adjacent to surgical clips placed at the time of the prior Nissen procedure. Small to moderate sized recurrent hiatal hernia. Fluid-filled stomach which is not distended.  Severe diffuse hepatic steatosis. The liver is suboptimally imaged due to the respiratory motion, but no focal parenchymal  abnormality is identified. Similarly, the spleen is unremarkable. I suspect diffuse enlargement of the pancreas and associated peripancreatic edema/inflammation. There is also edema/inflammation surrounding the descending portion of the duodenum.  Normal adrenal glands. Hydroureteronephrosis involving both  kidneys without evidence of obstructing stone or mass. Severe scarring involving both kidneys. Patchy enhancement the kidneys consistent with the renal insufficiency. Cortical cysts involving both kidneys. No visible solid renal masses. No significant lymphadenopathy.  The remainder the small bowel is unremarkable. Moderate stool burden in the ascending and transverse colon, with borderline gaseous distension of the transverse colon. Descending colon, sigmoid colon and rectum relatively decompressed. Wall thickening over a several cm segment of the distal rectum to the anal verge. No free intraperitoneal air. Small amount of ascites dependently in the pelvis.  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 No visible masses at either inferior orifice to account for the bilateral hydronephrosis. 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 Phleboliths low in both sides of the pelvis.  Bone window images demonstrate degenerative disc disease and spondylosis at L1-2 and L2-3, facet degenerative changes at L4-5 and L5-S1, and bone islands in the right iliac bone.  Review of the MIP images confirms the above findings.  IMPRESSION: 1. The examination was significantly degraded by respiratory motion. 2. No evidence of thoracic or abdominal aortic aneurysm or dissection. 3. 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 4. Acute pancreatitis is suspected. 5. Acute duodenitis is also suspected. This may be secondary to the presumed pancreatitis. 6. Proctitis involving the distal rectum to the level of the anal verge. This likely explains the bright red blood per rectum. 7. Severe diffuse hepatic steatosis. 8. Bilateral hydronephrosis without obstructing stone or mass. As there is evidence of a diverticulum arising from the right lateral wall of the bladder, the  hydronephrosis could be secondary to neurogenic bladder. Clinical correlation. 9. Scarring diffusely throughout both kidneys. 10. Widely patent visceral arteries. 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  No acute cardiopulmonary disease. 12. Enlarged main pulmonary arteries, measured above, consistent with pulmonary arterial hypertension. 13. Multiple subcentimeter nodules within a normal sized thyroid gland. This does not require further imaging followup. This follows ACR consensus guidelines: Managing Incidental Thyroid Nodules Detected on Imaging: White Paper of the ACR Incidental Thyroid Findings Committee. J Am Coll Radiol 2015; 12:143-150.  These results were discussed directly by telephone with Dr. Colin Rhein of the emergency department at 1625 hr and 1630 hr. She   Electronically Signed   By: Evangeline Dakin M.D.   On: 06/17/2014 16:46   Ct Cta Abd/pel W/cm &/or W/o Cm  07/09/2014   CLINICAL DATA:  At least 2 unwitnessed falls at home earlier today. Decreasing a blood pressure while in the emergency department. Acute onset abdominal pain, lumbar back pain and bilateral lower extremity pain. Surgical history includes Nissen fundoplication, light cysts of intestinal adhesions, and TURP.  EXAM: CT ANGIOGRAPHY CHEST, ABDOMEN AND PELVIS  TECHNIQUE: Multidetector CT imaging through the chest, abdomen and pelvis was performed using the standard protocol before and during bolus administration of intravenous contrast. Multiplanar reconstructed images and MIPs were obtained and reviewed to evaluate the vascular anatomy.  CONTRAST:  100 ml Omnipaque 350 IV.  It is noted that the patient has an elevated BUN of 48 and creatinine of 3.90. However, the emergency department physician felt that the current symptoms warranted the use of intravenous  iodinated contrast in order to exclude an aortic dissection.  COMPARISON:  CT abdomen and pelvis 02/13/2013, 01/03/2013, 04/28/2008. CT chest 04/28/2008.  FINDINGS: CTA CHEST FINDINGS  Respiratory  motion blurs images throughout the examination of the chest, rendering the examination less than optimal. Unenhanced images demonstrate no evidence of mural hematoma involving the thoracic aorta. Enhanced images demonstrate no evidence of dissection or aneurysm. Mild atherosclerosis is present involving the descending thoracic aorta. Proximal great vessels widely patent.  Heart size normal, though there may be mild left atrial enlargement. No visible coronary atherosclerosis. No pericardial effusion. Enlarged main pulmonary arteries, the right measuring up to 3.5 cm approximately diameter and the left up to approximately 3.0 cm diameter. No convincing evidence of central pulmonary embolism, though the timing of contrast enhancement was not optimal for visualization of the pulmonary arteries.  Emphysematous changes throughout both lungs. Pulmonary parenchyma clear without localized airspace consolidation, interstitial disease, or parenchymal nodules or masses. No pleural effusions. Central airways patent.  No significant mediastinal, hilar or axillary lymphadenopathy. Thyroid gland normal in size containing numerous small nodules, the largest approximating 8 mm. Fluid throughout the esophagus. Small hiatal hernia which will be discussed below.  Bone window images demonstrate degenerative disc disease and spondylosis involving the lower cervical spine with sclerosis involving the C7 vertebral body. No sclerotic vertebrae elsewhere in the visualized lower cervical were thoracic spine. Mild osseous demineralization is suspected.  Review of the MIP images confirms the above findings.  CTA ABDOMEN AND PELVIS FINDINGS  Respiratory motion blurs many of the images of the abdomen, particularly the upper abdomen, rendering the examination less than optimal. Unenhanced images demonstrate no evidence of mural thrombus involving the upper abdominal aorta. Enhanced images demonstrate mild atherosclerosis involving the distal  abdominal aorta without evidence of aneurysm or dissection. Mild left common iliac atherosclerosis. Widely patent celiac artery, SMA, and IMA. Widely patent single renal arteries bilaterally.  There is a round collection of contrast adjacent to or involving the wall of the distal esophagus or gastric cardia at the EG junction, best seen on the coronal reformatted images, measuring approximately 1.9 x 1.3 cm. This is immediately adjacent to surgical clips placed at the time of the prior Nissen procedure. Small to moderate sized recurrent hiatal hernia. Fluid-filled stomach which is not distended.  Severe diffuse hepatic steatosis. The liver is suboptimally imaged due to the respiratory motion, but no focal parenchymal abnormality is identified. Similarly, the spleen is unremarkable. I suspect diffuse enlargement of the pancreas and associated peripancreatic edema/inflammation. There is also edema/inflammation surrounding the descending portion of the duodenum.  Normal adrenal glands. Hydroureteronephrosis involving both kidneys without evidence of obstructing stone or mass. Severe scarring involving both kidneys. Patchy enhancement the kidneys consistent with the renal insufficiency. Cortical cysts involving both kidneys. No visible solid renal masses. No significant lymphadenopathy.  The remainder the small bowel is unremarkable. Moderate stool burden in the ascending and transverse colon, with borderline gaseous distension of the transverse colon. Descending colon, sigmoid colon and rectum relatively decompressed. Wall thickening over a several cm segment of the distal rectum to the anal verge. No free intraperitoneal air. Small amount of ascites dependently in the pelvis.  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 No visible masses at either inferior orifice to account for the bilateral hydronephrosis. 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 Phleboliths low in both sides of the  pelvis.  Bone window images demonstrate degenerative disc disease and spondylosis at L1-2 and L2-3,  facet degenerative changes at L4-5 and L5-S1, and bone islands in the right iliac bone.  Review of the MIP images confirms the above findings.  IMPRESSION: 1. The examination was significantly degraded by respiratory motion. 2. No evidence of thoracic or abdominal aortic aneurysm or dissection. 3. 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 4. Acute pancreatitis is suspected. 5. Acute duodenitis is also suspected. This may be secondary to the presumed pancreatitis. 6. Proctitis involving the distal rectum to the level of the anal verge. This likely explains the bright red blood per rectum. 7. Severe diffuse hepatic steatosis. 8. Bilateral hydronephrosis without obstructing stone or mass. As there is evidence of a diverticulum arising from the right lateral wall of the bladder, the hydronephrosis could be secondary to neurogenic bladder. Clinical correlation. 9. Scarring diffusely throughout both kidneys. 10. Widely patent visceral arteries. 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  No acute cardiopulmonary disease. 12. Enlarged main pulmonary arteries, measured above, consistent with pulmonary arterial hypertension. 13. Multiple subcentimeter nodules within a normal sized thyroid gland. This does not require further imaging followup. This follows ACR consensus guidelines: Managing Incidental Thyroid Nodules Detected on Imaging: White Paper of the ACR Incidental Thyroid Findings Committee. J Am Coll Radiol 2015; 12:143-150.  These results were discussed directly by telephone with Dr. Colin Rhein of the emergency department at 1625 hr and 1630 hr. She   Electronically Signed   By: Evangeline Dakin M.D.   On: 06/20/2014 16:46    Anti-infectives: Anti-infectives    Start     Dose/Rate Route Frequency  Ordered Stop   07/13/2014 0130  piperacillin-tazobactam (ZOSYN) IVPB 2.25 g     2.25 g100 mL/hr over 30 Minutes Intravenous Every 6 hours 06/18/2014 1905     07/12/2014 1900  piperacillin-tazobactam (ZOSYN) IVPB 3.375 g     3.375 g100 mL/hr over 30 Minutes Intravenous  Once 07/03/2014 1859     06/24/2014 1800  vancomycin (VANCOCIN) 1,500 mg in sodium chloride 0.9 % 500 mL IVPB     1,500 mg250 mL/hr over 120 Minutes Intravenous  Once 07/01/2014 1649 07/03/2014 2243   07/01/2014 1700  piperacillin-tazobactam (ZOSYN) IVPB 3.375 g     3.375 g100 mL/hr over 30 Minutes Intravenous  Once 07/05/2014 1649 06/28/2014 1752      Assessment/Plan: Acute respiratory failure Acute renal failure  Lactate acidosis Septic Shock Acute pancreatitis Anemia Thrombocytopenia Coagulopathic Elevated LFTs c/w shock liver Acute abdomen  His abd exam has changed from last evening. He now has diffuse tenderness. Given his clinical situation is not improving and still requiring vasopressor, I'm concerned he has additional intra-abd pathology other than pancreatitis and duodenitis. I don't think repeating his scan would add significant information to the decision making process (not candidate for IV contrast). I have recommended to the pt -  Exp lap, possible bowel resection, possible ostomy. Pt nods in his head in agreement after i discussed risks.   Spoke with brother on phone - discussed clinical situation and my assessment and rationale for exp lap. Discussed risks of bleeding, infection, injury to surrounding structures, hernia, potential need for bowel resection, ostomy, fistula, negative laparotomy, need for transfusion, death, addl procedures. Brother gave verbal consent with 2 nurse verification.   Will plan to take to OR later this am once OR available and check a DIC panel and get appropriate blood products ready.  Total critical care time 47 minutes, including reviewing  imaging, labs, notes, coordinating care, family  discussion  Leighton Ruff. Redmond Pulling, MD, FACS General, Bariatric, & Minimally Invasive Surgery [facility], [place]   LOS: 1 day    Gayland Curry 07/06/2014

## 2014-06-22 NOTE — Progress Notes (Signed)
PULMONARY / CRITICAL CARE MEDICINE   Name: [name] MRN: [id number] DOB: 1950-08-31   PCP Unk Pinto DAVID, MD [facility] - Dr Alben Spittle [facility] pum - [facility] - last seen may 2012  ADMISSION DATE:  07/02/2014 CONSULTATION DATE:  06/27/2014   REFERRING MD :  ER at [facility]  CHIEF COMPLAINT:  Acute abd pain, lactic acidosis 17 . Mottled intubated  BRIEF:  64 year old male  With bp, dm, lipid, pre-dm and vitamin d def, with no family locally (brother in [place]) - hx from ER RN/MD. 5d hx of abdominal pain, weakness and falling. EMS found him hypoglycemic and hypothermic. In ER tachypneic and mottled needing intubation. Never dropped bP but lactic acidosis of 17 despite fluids and without clearance. PCCM Asked to admit. 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 Other findings include emphysema and non obstructive hydronephrosis  2012 eval for dyspnea  - Ex heavy passive smoker. Has chronic dyspnea on exertion with hyooxemia at rest. Dysnea is at class 2-3 level. On walking he does not sem to desaturate ad in fact pulse ox picked up suggesting underlying atelectasis related to raised left diaphragm. . 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 CPST test 05/24/2010 is normal but for mild reduction in HRresponse due to beta blocker and mild elevation in dead space possibly rlated to the left diaprhagm issue. There was no evidence of exercise induced bronchospasm on CPST. He comenced pulmonary rehab in March 2012 andd by May 2012 he had improved significantly with pulmnary rehab. This suggests deconditioning and weight issues and diaphragm as main causes of dyspnea  PAST MEDICAL HISTORY :   has a past medical history of Hyperlipidemia; HTN (hypertension); Allergic rhinitis; Headache(784.0); Nocturia; Urinary retention; Foley catheter in place; UTI (urinary  tract infection); GERD (gastroesophageal reflux disease); Prediabetes; Depression; Arthritis; and Bell's palsy.  has past surgical history that includes Hernia repair; Nissen fundoplication; Laparoscopic lysis intestinal adhesions; Transurethral resection of prostate (N/A, 03/20/2013); Carpal tunnel release; and Nerve Surgery.     SIGNIFICANT EVENTS: 06/28/2014 - admit, intubated and left IJ at [place]    SUBJECTIVE:   06/19/2014: continued lactic acidosis > 15 -> OR nil acute finding on pLAP other than acute pancreatitis. Significant abd pain + -> improved after fent gtt and patient began to communicate better. Renal failure now - urology thinks is non obst hydro. ATN - from sepsis, pancreatitis, meloxicam and ct dye per renal. On bicarb gtt. On levophed. Coagulopathic as well  VITAL SIGNS: Temp:  [93.9 F (34.4 C)-100.5 F (38.1 C)] 98.5 F (36.9 C) (02/07 1800) Pulse Rate:  [67-101] 71 (02/07 1729) Resp:  [31-35] 35 (02/07 1729) BP: (67-120)/(42-101) 86/66 mmHg (02/07 1800) SpO2:  [95 %-100 %] 100 % (02/07 1729) Arterial Line BP: (87-93)/(46-51) 87/46 mmHg (02/07 1800) FiO2 (%):  [40 %-50 %] 40 % (02/07 1800) Weight:  [94.7 kg (208 lb 12.4 oz)] 94.7 kg (208 lb 12.4 oz) (02/07 0341) HEMODYNAMICS: CVP:  [11 mmHg-19 mmHg] 12 mmHg VENTILATOR SETTINGS: Vent Mode:  [-] PRVC FiO2 (%):  [40 %-50 %] 40 % Set Rate:  [35 bmp] 35 bmp Vt Set:  [650 mL] 650 mL PEEP:  [5 cmH20] 5 cmH20 Plateau Pressure:  [20 cmH20-23 cmH20] 20 cmH20 INTAKE / OUTPUT:  Intake/Output Summary (Last 24 hours) at 06/17/2014 1912 Last data filed at 06/25/2014 1800  Gross per 24 hour  Intake 12255.95 ml  Output   1602 ml  Net 10653.95 ml    PHYSICAL EXAMINATION: General:  Well built male. Intubated. Looks criticallill Neuro:  Post intubation  Sedated. RASS +2. CAM_ICU negative for delirum (earlier in day before pain control) HEENT:  ET tube + Cardiovascular:  Tachycardic. Normal heart sounds Lungs:  Sync with  vent. CTA bilaterally Abdomen:  Soft, No mass. Sluuggish bowel sounds. No rigidity Musculoskeletal:  No cyanosis, no clubbing,. No edema Skin:  Mottled in forearms and feet, knees look bruised  LABS: PULMONARY  Recent Labs Lab 07/10/2014 1822 06/30/2014 2119 06/24/2014 0309 07/07/2014 0351 06/18/2014 1125 06/21/2014 1524  PHART 6.939* 6.989*  --  7.244* 7.346* 7.342*  PCO2ART 21.0* 18.5*  --  18.7* 19.3* 17.9*  PO2ART 174.0* 160.0*  --  155.0* 153.0* 85.0  HCO3 4.5* 4.5*  --  7.8* 10.5* 9.7*  TCO2 5 5  --  8.4 11 10   O2SAT 98.0 98.0 59.6 98.0 99.0 96.0    CBC  Recent Labs Lab 07/01/2014 0310 06/27/2014 1000 07/10/2014 1400  HGB 10.9* 11.2* 10.5*  HCT 32.8* 32.4* 31.0*  WBC 12.3* 10.0 6.3  PLT 61* 56*  54* 46*    COAGULATION  Recent Labs Lab 06/20/14 1230 07/08/2014 1445 07/01/2014 2222 06/25/2014 1000  INR 1.4* 2.34* 2.57* 2.11*  2.11*    CARDIAC    Recent Labs Lab 07/07/2014 2222 07/01/2014 0310 06/25/2014 0630  TROPONINI 0.05* 0.05* 0.05*   No results for input(s): PROBNP in the last 168 hours.   CHEMISTRY  Recent Labs Lab 06/16/2014 1445 06/24/2014 1503 06/20/2014 2222 07/12/2014 0310 07/10/2014 1000 07/02/2014 1400  NA 140 139 141 139 140 142  K 5.4* 5.0 5.4* 5.8* 5.7* 5.1  CL 103 104 103 107 109 109  CO2 7*  --  15* 11* 14* 12*  GLUCOSE 118* 107* 415* 154* 104* 101*  BUN 52* 48* 39* 42* 43* 42*  CREATININE 4.15* 3.90* 3.16* 3.42* 3.61* 3.27*  CALCIUM 8.1*  --  6.0* 6.2* 5.6* 5.5*  MG  --   --  1.5 1.6  --   --   PHOS  --   --  7.0* 6.9*  --   --    Estimated Creatinine Clearance: 26.9 mL/min (by C-G formula based on Cr of 3.27).   LIVER  Recent Labs Lab 06/20/14 1230 07/03/2014 1445 06/19/2014 2222 06/17/2014 0310 07/10/2014 1000  AST 192*  192* 191* 175* 182*  --   ALT 312*  312* 228* 163* 154*  --   ALKPHOS 80  80 81 70 69  --   BILITOT 5.3*  5.3* 4.4* 3.6* 3.9*  --   PROT 5.3*  5.3* 4.2* 3.4* 4.0*  --   ALBUMIN 3.1*  3.1* 2.5* 1.8* 2.2*  --   INR 1.4*  2.34* 2.57*  --  2.11*  2.11*     INFECTIOUS  Recent Labs Lab 06/20/2014 2222 07/10/2014 1000 06/24/2014 1307  LATICACIDVEN 16.8* 15.9* 13.4*  PROCALCITON 4.67 8.35  --      ENDOCRINE CBG (last 3)   Recent Labs  07/06/2014 1840 07/12/2014 1849 06/16/2014 2326  GLUCAP 98 110* 122*         IMAGING x48h US Renal  07/07/2014   CLINICAL DATA:  Acute renal failure.  Hydronephrosis  EXAM: RENAL/URINARY TRACT ULTRASOUND COMPLETE  COMPARISON:  CT 06/16/2014  FINDINGS: Right Kidney:  Length: 8.6 cm. 13 mm upper pole cyst. Negative for hydronephrosis. Renal cortex normal in echogenicity.  Left Kidney:  Length: 9.7 cm. 12 mm upper pole cyst. Echogenicity within normal limits. No mass or hydronephrosis visualized.  Bladder:  Urinary bladder is empty with a Foley catheter.  Small amount of free fluid in the abdomen.  IMPRESSION: Small right kidney.  Negative for hydronephrosis.   Electronically Signed   By: Franchot Gallo M.D.   On: 07/07/2014 18:54   Dg Chest Port 1 View  07/02/2014   CLINICAL DATA:  Respiratory failure  EXAM: PORTABLE CHEST - 1 VIEW  COMPARISON:  07/10/2014  FINDINGS: Mild left basilar atelectasis. No focal consolidation. No pleural effusion or pneumothorax.  Endotracheal tube terminates 4.5 cm above the carina.  Left IJ venous catheter terminates in the mid SVC.  Enteric tube terminates in the gastric cardia.  IMPRESSION: No evidence of acute cardiopulmonary disease.  Endotracheal tube terminates 4.5 cm above the carina.  Additional support apparatus as above.   Electronically Signed   By: Julian Hy M.D.   On: 07/08/2014 09:52   Dg Chest Portable 1 View  06/30/2014   CLINICAL DATA:  Post intubation and central line placement.  EXAM: PORTABLE CHEST - 1 VIEW  COMPARISON:  CT chest and chest radiograph 06/30/2014.  FINDINGS: Endotracheal tube terminates approximately 2.2 cm above the carina. Left IJ central line tip projects over the upper SVC. Heart size stable. There may be  minimal subsegmental atelectasis at the lung bases. Lungs are low in volume. No pleural fluid. No pneumothorax.  IMPRESSION: 1. Endotracheal tube terminates approximately 2.2 cm above the carina. Retracting approximately 1 cm would better position the tip above the carina. 2. Left IJ central line placement without pneumothorax.   Electronically Signed   By: Lorin Picket M.D.   On: 07/02/2014 18:39   Dg Chest Portable 1 View  07/06/2014   CLINICAL DATA:  Trauma patient. Loss of consciousness following 2x earlier today. Patient now complaining of abdominal, back and bilateral leg pain.  EXAM: PORTABLE CHEST - 1 VIEW  COMPARISON:  03/18/2013  FINDINGS: Apparent enlargement of the cardiac silhouette and mediastinal contours, in particular, there is apparent enlargement of the ascending and descending thoracic aorta. No focal airspace opacities. No pleural effusion or pneumothorax. No evidence of edema. No acute osseus abnormalities.  IMPRESSION: Apparent enlargement of the cardiac silhouette and mediastinal contours, possibly accentuated due to decreased lung volumes and patient rotation though a thoracic aortic aneurysm and/or dissection could have a similar appearance. Further evaluation could be performed with PA and lateral chest radiograph and/or chest CTA as clinically indicated.  Critical Value/emergent results were called by telephone at the time of interpretation on 07/06/2014 at 3:13 pm to Dr. Debby Freiberg , who verbally acknowledged these results.   Electronically Signed   By: Sandi Mariscal M.D.   On: 07/11/2014 15:15   Dg Abd Portable 1v  07/13/2014   CLINICAL DATA:  Evaluate nasogastric tube placement.  EXAM: PORTABLE ABDOMEN - 1 VIEW  COMPARISON:  Chest radiograph of 1 day prior  FINDINGS: Motion degraded portable supine view. Nasogastric tube is looped in the stomach, with tip at proximal gastric body. Gas within the colon. No definite small bowel distention. The right side of the abdomen is  excluded.  IMPRESSION: Nasogastric tube looped in the stomach, with tip at proximal stomach.   Electronically Signed   By: Abigail Miyamoto M.D.   On: 06/22/2014 09:51   Ct Angio Chest Aorta W/cm &/or Wo/cm  06/19/2014   CLINICAL DATA:  At least 2 unwitnessed falls at home  earlier today. Decreasing a blood pressure while in the emergency department. Acute onset abdominal pain, lumbar back pain and bilateral lower extremity pain. Surgical history includes Nissen fundoplication, light cysts of intestinal adhesions, and TURP.  EXAM: CT ANGIOGRAPHY CHEST, ABDOMEN AND PELVIS  TECHNIQUE: Multidetector CT imaging through the chest, abdomen and pelvis was performed using the standard protocol before and during bolus administration of intravenous contrast. Multiplanar reconstructed images and MIPs were obtained and reviewed to evaluate the vascular anatomy.  CONTRAST:  100 ml Omnipaque 350 IV.  It is noted that the patient has an elevated BUN of 48 and creatinine of 3.90. However, the emergency department physician felt that the current symptoms warranted the use of intravenous iodinated contrast in order to exclude an aortic dissection.  COMPARISON:  CT abdomen and pelvis 02/13/2013, 01/03/2013, 04/28/2008. CT chest 04/28/2008.  FINDINGS: CTA CHEST FINDINGS  Respiratory motion blurs images throughout the examination of the chest, rendering the examination less than optimal. Unenhanced images demonstrate no evidence of mural hematoma involving the thoracic aorta. Enhanced images demonstrate no evidence of dissection or aneurysm. Mild atherosclerosis is present involving the descending thoracic aorta. Proximal great vessels widely patent.  Heart size normal, though there may be mild left atrial enlargement. No visible coronary atherosclerosis. No pericardial effusion. Enlarged main pulmonary arteries, the right measuring up to 3.5 cm approximately diameter and the left up to approximately 3.0 cm diameter. No convincing evidence  of central pulmonary embolism, though the timing of contrast enhancement was not optimal for visualization of the pulmonary arteries.  Emphysematous changes throughout both lungs. Pulmonary parenchyma clear without localized airspace consolidation, interstitial disease, or parenchymal nodules or masses. No pleural effusions. Central airways patent.  No significant mediastinal, hilar or axillary lymphadenopathy. Thyroid gland normal in size containing numerous small nodules, the largest approximating 8 mm. Fluid throughout the esophagus. Small hiatal hernia which will be discussed below.  Bone window images demonstrate degenerative disc disease and spondylosis involving the lower cervical spine with sclerosis involving the C7 vertebral body. No sclerotic vertebrae elsewhere in the visualized lower cervical were thoracic spine. Mild osseous demineralization is suspected.  Review of the MIP images confirms the above findings.  CTA ABDOMEN AND PELVIS FINDINGS  Respiratory motion blurs many of the images of the abdomen, particularly the upper abdomen, rendering the examination less than optimal. Unenhanced images demonstrate no evidence of mural thrombus involving the upper abdominal aorta. Enhanced images demonstrate mild atherosclerosis involving the distal abdominal aorta without evidence of aneurysm or dissection. Mild left common iliac atherosclerosis. Widely patent celiac artery, SMA, and IMA. Widely patent single renal arteries bilaterally.  There is a round collection of contrast adjacent to or involving the wall of the distal esophagus or gastric cardia at the EG junction, best seen on the coronal reformatted images, measuring approximately 1.9 x 1.3 cm. This is immediately adjacent to surgical clips placed at the time of the prior Nissen procedure. Small to moderate sized recurrent hiatal hernia. Fluid-filled stomach which is not distended.  Severe diffuse hepatic steatosis. The liver is suboptimally imaged due  to the respiratory motion, but no focal parenchymal abnormality is identified. Similarly, the spleen is unremarkable. I suspect diffuse enlargement of the pancreas and associated peripancreatic edema/inflammation. There is also edema/inflammation surrounding the descending portion of the duodenum.  Normal adrenal glands. Hydroureteronephrosis involving both kidneys without evidence of obstructing stone or mass. Severe scarring involving both kidneys. Patchy enhancement the kidneys consistent with the renal insufficiency. Cortical cysts involving both kidneys. No  visible solid renal masses. No significant lymphadenopathy.  The remainder the small bowel is unremarkable. Moderate stool burden in the ascending and transverse colon, with borderline gaseous distension of the transverse colon. Descending colon, sigmoid colon and rectum relatively decompressed. Wall thickening over a several cm segment of the distal rectum to the anal verge. No free intraperitoneal air. Small amount of ascites dependently in the pelvis.  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 No visible masses at either inferior orifice to account for the bilateral hydronephrosis. 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 Phleboliths low in both sides of the pelvis.  Bone window images demonstrate degenerative disc disease and spondylosis at L1-2 and L2-3, facet degenerative changes at L4-5 and L5-S1, and bone islands in the right iliac bone.  Review of the MIP images confirms the above findings.  IMPRESSION: 1. The examination was significantly degraded by respiratory motion. 2. No evidence of thoracic or abdominal aortic aneurysm or dissection. 3. 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 4. Acute pancreatitis is suspected. 5. Acute duodenitis is also suspected. This may be secondary  to the presumed pancreatitis. 6. Proctitis involving the distal rectum to the level of the anal verge. This likely explains the bright red blood per rectum. 7. Severe diffuse hepatic steatosis. 8. Bilateral hydronephrosis without obstructing stone or mass. As there is evidence of a diverticulum arising from the right lateral wall of the bladder, the hydronephrosis could be secondary to neurogenic bladder. Clinical correlation. 9. Scarring diffusely throughout both kidneys. 10. Widely patent visceral arteries. 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  No acute cardiopulmonary disease. 12. Enlarged main pulmonary arteries, measured above, consistent with pulmonary arterial hypertension. 13. Multiple subcentimeter nodules within a normal sized thyroid gland. This does not require further imaging followup. This follows ACR consensus guidelines: Managing Incidental Thyroid Nodules Detected on Imaging: White Paper of the ACR Incidental Thyroid Findings Committee. J Am Coll Radiol 2015; 12:143-150.  These results were discussed directly by telephone with Dr. Colin Rhein of the emergency department at 1625 hr and 1630 hr. She   Electronically Signed   By: Evangeline Dakin M.D.   On: 07/01/2014 16:46   Ct Cta Abd/pel W/cm &/or W/o Cm  06/19/2014   CLINICAL DATA:  At least 2 unwitnessed falls at home earlier today. Decreasing a blood pressure while in the emergency department. Acute onset abdominal pain, lumbar back pain and bilateral lower extremity pain. Surgical history includes Nissen fundoplication, light cysts of intestinal adhesions, and TURP.  EXAM: CT ANGIOGRAPHY CHEST, ABDOMEN AND PELVIS  TECHNIQUE: Multidetector CT imaging through the chest, abdomen and pelvis was performed using the standard protocol before and during bolus administration of intravenous contrast. Multiplanar reconstructed images and MIPs were obtained and reviewed to evaluate the vascular anatomy.  CONTRAST:  100 ml Omnipaque 350 IV.  It is noted that the patient has an  elevated BUN of 48 and creatinine of 3.90. However, the emergency department physician felt that the current symptoms warranted the use of intravenous iodinated contrast in order to exclude an aortic dissection.  COMPARISON:  CT abdomen and pelvis 02/13/2013, 01/03/2013, 04/28/2008. CT chest 04/28/2008.  FINDINGS: CTA CHEST FINDINGS  Respiratory motion blurs images throughout the examination of the chest, rendering the examination less than optimal. Unenhanced images demonstrate no evidence of mural hematoma involving the thoracic aorta. Enhanced images demonstrate no evidence of dissection or aneurysm.  Mild atherosclerosis is present involving the descending thoracic aorta. Proximal great vessels widely patent.  Heart size normal, though there may be mild left atrial enlargement. No visible coronary atherosclerosis. No pericardial effusion. Enlarged main pulmonary arteries, the right measuring up to 3.5 cm approximately diameter and the left up to approximately 3.0 cm diameter. No convincing evidence of central pulmonary embolism, though the timing of contrast enhancement was not optimal for visualization of the pulmonary arteries.  Emphysematous changes throughout both lungs. Pulmonary parenchyma clear without localized airspace consolidation, interstitial disease, or parenchymal nodules or masses. No pleural effusions. Central airways patent.  No significant mediastinal, hilar or axillary lymphadenopathy. Thyroid gland normal in size containing numerous small nodules, the largest approximating 8 mm. Fluid throughout the esophagus. Small hiatal hernia which will be discussed below.  Bone window images demonstrate degenerative disc disease and spondylosis involving the lower cervical spine with sclerosis involving the C7 vertebral body. No sclerotic vertebrae elsewhere in the visualized lower cervical were thoracic spine. Mild osseous demineralization is suspected.  Review of the MIP images confirms the above  findings.  CTA ABDOMEN AND PELVIS FINDINGS  Respiratory motion blurs many of the images of the abdomen, particularly the upper abdomen, rendering the examination less than optimal. Unenhanced images demonstrate no evidence of mural thrombus involving the upper abdominal aorta. Enhanced images demonstrate mild atherosclerosis involving the distal abdominal aorta without evidence of aneurysm or dissection. Mild left common iliac atherosclerosis. Widely patent celiac artery, SMA, and IMA. Widely patent single renal arteries bilaterally.  There is a round collection of contrast adjacent to or involving the wall of the distal esophagus or gastric cardia at the EG junction, best seen on the coronal reformatted images, measuring approximately 1.9 x 1.3 cm. This is immediately adjacent to surgical clips placed at the time of the prior Nissen procedure. Small to moderate sized recurrent hiatal hernia. Fluid-filled stomach which is not distended.  Severe diffuse hepatic steatosis. The liver is suboptimally imaged due to the respiratory motion, but no focal parenchymal abnormality is identified. Similarly, the spleen is unremarkable. I suspect diffuse enlargement of the pancreas and associated peripancreatic edema/inflammation. There is also edema/inflammation surrounding the descending portion of the duodenum.  Normal adrenal glands. Hydroureteronephrosis involving both kidneys without evidence of obstructing stone or mass. Severe scarring involving both kidneys. Patchy enhancement the kidneys consistent with the renal insufficiency. Cortical cysts involving both kidneys. No visible solid renal masses. No significant lymphadenopathy.  The remainder the small bowel is unremarkable. Moderate stool burden in the ascending and transverse colon, with borderline gaseous distension of the transverse colon. Descending colon, sigmoid colon and rectum relatively decompressed. Wall thickening over a several cm segment of the distal  rectum to the anal verge. No free intraperitoneal air. Small amount of ascites dependently in the pelvis.  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 No visible masses at either inferior orifice to account for the bilateral hydronephrosis. 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 Phleboliths low in both sides of the pelvis.  Bone window images demonstrate degenerative disc disease and spondylosis at L1-2 and L2-3, facet degenerative changes at L4-5 and L5-S1, and bone islands in the right iliac bone.  Review of the MIP images confirms the above findings.  IMPRESSION: 1. The examination was significantly degraded by respiratory motion. 2. No evidence of thoracic or abdominal aortic aneurysm or dissection. 3. 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 4. Acute pancreatitis is suspected. 5. Acute duodenitis is also suspected. This may be secondary to the presumed pancreatitis. 6. Proctitis involving the distal rectum to the level of the anal verge. This likely explains the bright red blood per rectum. 7. Severe diffuse hepatic steatosis. 8. Bilateral hydronephrosis without obstructing stone or mass. As there is evidence of a diverticulum arising from the right lateral wall of the bladder, the hydronephrosis could be secondary to neurogenic bladder. Clinical correlation. 9. Scarring diffusely throughout both kidneys. 10. Widely patent visceral arteries. 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  No acute cardiopulmonary disease. 12. Enlarged main pulmonary arteries, measured above, consistent with pulmonary arterial hypertension. 13. Multiple subcentimeter nodules within a normal sized thyroid gland. This does not require further imaging followup. This follows ACR consensus guidelines: Managing Incidental Thyroid Nodules Detected on Imaging: White Paper of the ACR Incidental  Thyroid Findings Committee. J Am Coll Radiol 2015; 12:143-150.  These results were discussed directly by telephone with Dr. Colin Rhein of the emergency department at 1625 hr and 1630 hr. She   Electronically Signed   By: Evangeline Dakin M.D.   On: 07/04/2014 16:46         ASSESSMENT / PLAN:  PULMONARY OETT 07/10/2014 ER A:acute resp failure due to renal failure, lactic acidosis and pain and metabolic demand P:   PRVC  CARDIOVASCULAR CVL- LEft IJ 07/02/2014  A: Circulatory shockk - pancreatitis and sepsis  P:  Hydrate CVP goal >10 with fluids MAP goal > 65 with pressors Get echo   RENAL A:   Acute renal failure  - non obst hydro on CT Proffound lactic acidosis - not on metformin, invokana   - acidotic with poor lactate clearance.   P:   REnal consult - conservative mgmt for now -> CRRT if worsens Urology consult - monitor -> renal US -> no hydro Hydrate aggressively Track lactate q4h Foley Check CK  GASTROINTESTINAL A:  Acute  pancreatitis with ? Shock liver    - s/p plap 06/18/2014 - pancreatitis only finding   P:   NPO CCS input appreciated  HEMATOLOGIC A:  Coagulopathic   P:  Monitor closely  INFECTIOUS Blood  Urine Trach aspirate  A:  Septic shock based on PCT P:   Broad abx - chagne zosyn to imipenem Not a candidate for SSAIL study due to pancreatisi as source of sepsi  ENDOCRINE A:  PRe-DM at baseline   P:   ssi   NEUROLOGIC A:  At risk for delirium P:   Fent prn -> gtt Dc  diprivan due to acidosis  FAMILY  - Updates:  No family at bedside  - Inter-disciplinary family meet or Palliative Care meeting due by: 06/28/14    The patient is critically ill with multiple organ systems failure and requires high complexity decision making for assessment and support, frequent evaluation and titration of therapies, application of advanced monitoring technologies and extensive interpretation of multiple databases.   Critical Care Time devoted to  patient care services described in this note is  60  Minutes. This time reflects time of care of this signee Dr Brand Males. This critical care time does not reflect procedure time, or teaching time or supervisory time of PA/NP/Med student/Med Resident etc but could involve care discussion time    Dr. Brand Males, M.D., [facility].C.P Pulmonary and Critical Care Medicine Staff Physician [facility] Pager: [phone], If no answer or between  15:00h - 7:00h: call [phone]  [phone]  07/05/2014 7:12 PM

## 2014-06-22 NOTE — Progress Notes (Signed)
CRITICAL VALUE ALERT  Critical value received:  Lactic Acid    Date of notification:  06/30/2014   Time of notification:  8:27 PM   Critical value read back:Yes.    Nurse who received alert: Mick Sell RN  MD notified (1st page):  Dr. Tamala Julian  Time of first page:  8:29 PM   MD notified (2nd page):  Time of second page:  Responding MD:  Dr Tamala Julian  Time MD responded:  8:31 PM

## 2014-06-22 NOTE — Op Note (Signed)
06/20/2014 - 06/18/2014  2:30 PM  PATIENT:  [name]  64 y.o. male  PRE-OPERATIVE DIAGNOSIS:  Acute Abdomen  POST-OPERATIVE DIAGNOSIS:  Acute Pancreatitis   PROCEDURE:  Procedure(s): EXPLORATORY LAPAROTOMY   SURGEON:  Surgeon(s): Gayland Curry, MD [name], MD  ASSISTANTS: Dr [name]   ANESTHESIA:   general  DRAINS: Nasogastric Tube and Urinary Catheter (Foley)   LOCAL MEDICATIONS USED:  NONE  SPECIMEN:  No Specimen  DISPOSITION OF SPECIMEN:  N/A  COUNTS:  YES  INDICATION FOR PROCEDURE: 64 year old Caucasian male admitted yesterday evening with multisystem organ failure with pancreatitis. His abdominal exam was not that impressive last night by the on-call surgeon. He did have an lactate level greater than 17. He was started on broad-spectrum IV antibiotics as well as aggressive fluid resuscitation. This morning on exam he had diffuse abdominal tenderness. He was going into DIC. After extensive discussion with the patient while he was intubated along with his family member via phone I felt the safest course of action was to explore his abdomen to rule out any signs of bowel ischemia. We discussed the risks and benefits of surgery including but not limited to bleeding, infection, injury surrounding structures, need for additional procedures, negative laparotomy, need for bowel resection, possible ostomy, fistula formation, death, possible fistula formation.  PROCEDURE: He was taken urgently to the operating room. He was placed supine on operating room table. His endotracheal tube was connected to anesthesia circuit. He was fully anesthetized. He was on broad-spectrum therapeutic antibiotics. A surgical timeout was performed. His abdomen was prepped and draped in usual standard surgical fashion. Sequential compression devices had been placed. I made a mini midline incision using a #10 blade down to the fascia. The abdominal cavity was entered. He had old prior Tex mesh  that we cut through. There was ascites. I extended the fascial incision was I confirmed there were no omental adhesions or bowel adhesions to the anterior diagonal wall. There was little bit of omentum stuck to the upper part of the incision. The small bowel was eviscerated. The bowel was ran in its entirety. There is no evidence of small bowel ischemia. The cecum, right colon, transverse colon, sigmoid colon and upper rectum appeared normal. There was a small mesenteric tear made in the mid small bowel with a little bit of bleeding. This was repaired with interrupted 3-0 silk sutures. The stomach appeared grossly normal. We reposition the orogastric tube and decompressed the stomach. The pancreas felt hardened. However we did not open up the retroperitoneum. The abdomen was closed with #1 Novafil suture from above and from below incorporating the old mesh in the closure. The saphenous tissue was left open and packed with wet-to-dry dressing. He was taken back to the intensive care unit intubated and critically ill. All needle, instrument, and sponge counts were correct 2.  PLAN OF CARE: ICU  PATIENT DISPOSITION:  ICU - intubated and critically ill.   Delay start of Pharmacological VTE agent (>24hrs) due to surgical blood loss or risk of bleeding:  yes  Leighton Ruff. Redmond Pulling, MD, FACS General, Bariatric, & Minimally Invasive Surgery [facility], [place]

## 2014-06-22 NOTE — Progress Notes (Signed)
CRITICAL VALUE ALERT  Critical value received:  Calcium 5.6  Date of notification:  06/20/2014  Time of notification:  1101  Critical value read back:Yes.    Nurse who received alert:  Corrinne Eagle RN  MD notified (1st page):  Dr. Chase Caller  Time of first page:  1120(notified md in person)  MD notified (2nd page):  Time of second page:  Responding MD:  Chase Caller  Time MD responded:  1120

## 2014-06-22 NOTE — Anesthesia Postprocedure Evaluation (Signed)
Anesthesia Post Note  Patient: [name] Martes  Procedure(s) Performed: Procedure(s) (LRB): EXPLORATORY LAPAROTOMY  (N/A)  Anesthesia type: General  Patient location: ICU  Post pain: Pain level controlled  Post assessment: Post-op Vital signs reviewed  Last Vitals: BP 87/66 mmHg  Pulse 76  Temp(Src) 36.8 C (Core (Comment))  Resp 35  Ht 6' (1.829 m)  Wt 208 lb 12.4 oz (94.7 kg)  BMI 28.31 kg/m2  SpO2 99%  Post vital signs: Reviewed  Level of consciousness: sedated and intubated  Complications: No apparent anesthesia complications

## 2014-06-22 NOTE — Progress Notes (Signed)
CRITICAL VALUE ALERT  Critical value received:  CO2 18.5 on ABG  Date of notification:  06/19/2014  Time of notification:  3291  Critical value read back:Yes.    Nurse who received alert:  Corrinne Eagle RN  MD notified (1st page):  Dr. Chase Caller  Time of first page:  1128(notified md in person on unit- no new orders)  MD notified (2nd page):  Time of second page:  Responding MD:  Chase Caller  Time MD responded:  1128

## 2014-06-22 NOTE — Progress Notes (Signed)
ANTIBIOTIC CONSULT NOTE - INITIAL  Pharmacy Consult for primaxin  Indication: pancreatitis  Allergies  Allergen Reactions  . Chloraseptic Sore Throat [Acetaminophen] Other (See Comments)    Spray burnt mouth!  . Milk-Related Compounds Other (See Comments)    Causes gas and diarrhea    Patient Measurements: Height: 6' (182.9 cm) Weight: 208 lb 12.4 oz (94.7 kg) IBW/kg (Calculated) : 77.6 Adjusted Body Weight:   Vital Signs: Temp: 98.4 F (36.9 C) (02/07 1900) BP: 87/64 mmHg (02/07 1900) Pulse Rate: 71 (02/07 1729) Intake/Output from previous day: 02/06 0701 - 02/07 0700 In: 6712.2 [I.V.:5232.2; Blood:380; IV Piggyback:1100] Out: 955 [Urine:955] Intake/Output from this shift:    Labs:  Recent Labs  06/18/2014 0310 07/05/2014 1000 07/03/2014 1210 06/20/2014 1400  WBC 12.3* 10.0  --  6.3  HGB 10.9* 11.2*  --  10.5*  PLT 61* 56*  54*  --  46*  LABCREA  --   --  190.56  --   CREATININE 3.42* 3.61*  --  3.27*   Estimated Creatinine Clearance: 26.9 mL/min (by C-G formula based on Cr of 3.27). No results for input(s): VANCOTROUGH, VANCOPEAK, VANCORANDOM, GENTTROUGH, GENTPEAK, GENTRANDOM, TOBRATROUGH, TOBRAPEAK, TOBRARND, AMIKACINPEAK, AMIKACINTROU, AMIKACIN in the last 72 hours.   Microbiology: Recent Results (from the past 720 hour(s))  MRSA PCR Screening     Status: None   Collection Time: 07/13/2014 11:55 PM  Result Value Ref Range Status   MRSA by PCR NEGATIVE NEGATIVE Final    Comment:        The GeneXpert MRSA Assay (FDA approved for NASAL specimens only), is one component of a comprehensive MRSA colonization surveillance program. It is not intended to diagnose MRSA infection nor to guide or monitor treatment for MRSA infections.     Medical History: Past Medical History  Diagnosis Date  . Hyperlipidemia   . HTN (hypertension)   . Allergic rhinitis   . Headache(784.0)   . Nocturia   . Urinary retention   . Foley catheter in place   . UTI (urinary tract  infection)   . GERD (gastroesophageal reflux disease)   . Prediabetes   . Depression   . Arthritis   . Bell's palsy     Medications:  Anti-infectives    Start     Dose/Rate Route Frequency Ordered Stop   07/11/2014 2000  imipenem-cilastatin (PRIMAXIN) 250 mg in sodium chloride 0.9 % 100 mL IVPB     250 mg200 mL/hr over 30 Minutes Intravenous Every 6 hours 07/03/2014 1926     06/27/2014 0130  piperacillin-tazobactam (ZOSYN) IVPB 2.25 g  Status:  Discontinued     2.25 g100 mL/hr over 30 Minutes Intravenous Every 6 hours 07/04/2014 1905 07/07/2014 1921   06/17/2014 1900  piperacillin-tazobactam (ZOSYN) IVPB 3.375 g  Status:  Discontinued     3.375 g100 mL/hr over 30 Minutes Intravenous  Once 06/27/2014 1859 06/29/2014 1215   07/06/2014 1800  vancomycin (VANCOCIN) 1,500 mg in sodium chloride 0.9 % 500 mL IVPB     1,500 mg250 mL/hr over 120 Minutes Intravenous  Once 06/24/2014 1649 06/25/2014 2243   06/30/2014 1700  piperacillin-tazobactam (ZOSYN) IVPB 3.375 g     3.375 g100 mL/hr over 30 Minutes Intravenous  Once 06/17/2014 1649 06/19/2014 1752     Assessment: 50 yom presented to the hospital with abdominal pain. Initially started on zosyn but changing to primaxin for possible pancreatitis. Pt with poor renal function and possibly may need CRRT in the future.   Goal of Therapy:  Eradication of infection  Plan:  1. Primaxin 250mg  IV Q6H 2. F/u renal fxn, C&S, clinical status   Merian Wroe, Rande Lawman 06/17/2014,7:27 PM

## 2014-06-22 NOTE — Progress Notes (Signed)
eLink Physician-Brief Progress Note Patient Name: [name] DOB: [birth year] MRN: [id number]   Date of Service  06/21/2014  HPI/Events of Note  Worsening acidosis and shock  eICU Interventions  1 amp sodium bicarb Vasopressin infusion Calcium IV     Intervention Category Major Interventions: Acid-Base disturbance - evaluation and management  Mauri Brooklyn, P 07/09/2014, 10:05 PM

## 2014-06-22 NOTE — Transfer of Care (Signed)
Immediate Anesthesia Transfer of Care Note  Patient: [name]  Procedure(s) Performed: Procedure(s): EXPLORATORY LAPAROTOMY  (N/A)  Patient Location: ICU  Anesthesia Type:General  Level of Consciousness: sedated and Patient remains intubated per anesthesia plan  Airway & Oxygen Therapy: Patient remains intubated per anesthesia plan and Patient placed on Ventilator (see vital sign flow sheet for setting)  Post-op Assessment: Report given to RN and Post -op Vital signs reviewed and stable  Post vital signs: Reviewed and stable  Last Vitals:  Filed Vitals:   07/01/2014 1200  BP: 87/66  Pulse: 81  Temp: 37.8 C  Resp:     Complications: No apparent anesthesia complications

## 2014-06-23 ENCOUNTER — Encounter (HOSPITAL_COMMUNITY): Payer: Self-pay | Admitting: General Surgery

## 2014-06-23 DIAGNOSIS — I998 Other disorder of circulatory system: Secondary | ICD-10-CM

## 2014-06-23 DIAGNOSIS — J9600 Acute respiratory failure, unspecified whether with hypoxia or hypercapnia: Secondary | ICD-10-CM

## 2014-06-23 DIAGNOSIS — K850 Idiopathic acute pancreatitis: Secondary | ICD-10-CM

## 2014-06-23 LAB — MAGNESIUM: MAGNESIUM: 1.5 mg/dL (ref 1.5–2.5)

## 2014-06-23 LAB — POCT I-STAT 3, ART BLOOD GAS (G3+)
Acid-base deficit: 8 mmol/L — ABNORMAL HIGH (ref 0.0–2.0)
BICARBONATE: 15.3 meq/L — AB (ref 20.0–24.0)
O2 Saturation: 97 %
Patient temperature: 98.6
TCO2: 16 mmol/L (ref 0–100)
pCO2 arterial: 25.2 mmHg — ABNORMAL LOW (ref 35.0–45.0)
pH, Arterial: 7.393 (ref 7.350–7.450)
pO2, Arterial: 92 mmHg (ref 80.0–100.0)

## 2014-06-23 LAB — RENAL FUNCTION PANEL
ALBUMIN: 1.8 g/dL — AB (ref 3.5–5.2)
ANION GAP: 26 — AB (ref 5–15)
BUN: 47 mg/dL — ABNORMAL HIGH (ref 6–23)
CALCIUM: 5.3 mg/dL — AB (ref 8.4–10.5)
CHLORIDE: 103 mmol/L (ref 96–112)
CO2: 12 mmol/L — ABNORMAL LOW (ref 19–32)
CREATININE: 3.98 mg/dL — AB (ref 0.50–1.35)
GFR calc Af Amer: 17 mL/min — ABNORMAL LOW (ref >90)
GFR, EST NON AFRICAN AMERICAN: 14 mL/min — AB (ref >90)
Glucose, Bld: 98 mg/dL (ref 70–99)
PHOSPHORUS: 5.6 mg/dL — AB (ref 2.3–4.6)
Potassium: 4.7 mmol/L (ref 3.5–5.1)
SODIUM: 141 mmol/L (ref 135–145)

## 2014-06-23 LAB — URINE CULTURE
COLONY COUNT: NO GROWTH
Culture: NO GROWTH

## 2014-06-23 LAB — CK TOTAL AND CKMB (NOT AT ARMC)
CK TOTAL: 764 U/L — AB (ref 7–232)
CK, MB: 7.5 ng/mL (ref 0.3–4.0)
RELATIVE INDEX: 1 (ref 0.0–2.5)

## 2014-06-23 LAB — AMYLASE: AMYLASE: 278 U/L — AB (ref 0–105)

## 2014-06-23 LAB — PREPARE FRESH FROZEN PLASMA
UNIT DIVISION: 0
Unit division: 0
Unit division: 0
Unit division: 0

## 2014-06-23 LAB — CBC WITH DIFFERENTIAL/PLATELET
Basophils Absolute: 0 10*3/uL (ref 0.0–0.1)
Basophils Relative: 0 % (ref 0–1)
EOS ABS: 0 10*3/uL (ref 0.0–0.7)
Eosinophils Relative: 0 % (ref 0–5)
HCT: 31.1 % — ABNORMAL LOW (ref 39.0–52.0)
HEMOGLOBIN: 10.7 g/dL — AB (ref 13.0–17.0)
LYMPHS ABS: 0.7 10*3/uL (ref 0.7–4.0)
Lymphocytes Relative: 5 % — ABNORMAL LOW (ref 12–46)
MCH: 32.4 pg (ref 26.0–34.0)
MCHC: 34.4 g/dL (ref 30.0–36.0)
MCV: 94.2 fL (ref 78.0–100.0)
MONO ABS: 1.7 10*3/uL — AB (ref 0.1–1.0)
Monocytes Relative: 12 % (ref 3–12)
Neutro Abs: 11.7 10*3/uL — ABNORMAL HIGH (ref 1.7–7.7)
Neutrophils Relative %: 83 % — ABNORMAL HIGH (ref 43–77)
PLATELETS: 62 10*3/uL — AB (ref 150–400)
RBC: 3.3 MIL/uL — AB (ref 4.22–5.81)
RDW: 16.8 % — ABNORMAL HIGH (ref 11.5–15.5)
WBC: 14.1 10*3/uL — ABNORMAL HIGH (ref 4.0–10.5)

## 2014-06-23 LAB — LACTIC ACID, PLASMA
LACTIC ACID, VENOUS: 18 mmol/L — AB (ref 0.5–2.0)
Lactic Acid, Venous: 17.1 mmol/L (ref 0.5–2.0)

## 2014-06-23 LAB — BLOOD GAS, ARTERIAL
Acid-base deficit: 13 mmol/L — ABNORMAL HIGH (ref 0.0–2.0)
Bicarbonate: 11.2 mEq/L — ABNORMAL LOW (ref 20.0–24.0)
FIO2: 0.4 %
LHR: 35 {breaths}/min
MECHVT: 650 mL
O2 Saturation: 95.4 %
PEEP/CPAP: 5 cmH2O
Patient temperature: 99.1
TCO2: 11.8 mmol/L (ref 0–100)
pCO2 arterial: 19.7 mmHg — CL (ref 35.0–45.0)
pH, Arterial: 7.377 (ref 7.350–7.450)
pO2, Arterial: 104 mmHg — ABNORMAL HIGH (ref 80.0–100.0)

## 2014-06-23 LAB — PREPARE CRYOPRECIPITATE: Unit division: 0

## 2014-06-23 LAB — LEGIONELLA ANTIGEN, URINE

## 2014-06-23 LAB — PROTIME-INR
INR: 1.91 — ABNORMAL HIGH (ref 0.00–1.49)
PROTHROMBIN TIME: 22.1 s — AB (ref 11.6–15.2)

## 2014-06-23 LAB — ANA: ANA: NEGATIVE

## 2014-06-23 LAB — LIPASE, BLOOD: LIPASE: 100 U/L — AB (ref 11–59)

## 2014-06-23 LAB — PROCALCITONIN: Procalcitonin: 7.79 ng/mL

## 2014-06-23 MED ORDER — PHENYLEPHRINE HCL 10 MG/ML IJ SOLN
30.0000 ug/min | INTRAMUSCULAR | Status: DC
  Administered 2014-06-23: 200 ug/min via INTRAVENOUS
  Filled 2014-06-23: qty 1

## 2014-06-23 MED ORDER — MAGNESIUM SULFATE 2 GM/50ML IV SOLN
2.0000 g | Freq: Once | INTRAVENOUS | Status: AC
  Administered 2014-06-23: 2 g via INTRAVENOUS
  Filled 2014-06-23: qty 50

## 2014-06-23 MED ORDER — AMIODARONE LOAD VIA INFUSION
150.0000 mg | Freq: Once | INTRAVENOUS | Status: AC
  Administered 2014-06-23: 150 mg via INTRAVENOUS
  Filled 2014-06-23: qty 83.34

## 2014-06-23 MED ORDER — AMIODARONE HCL IN DEXTROSE 360-4.14 MG/200ML-% IV SOLN
60.0000 mg/h | INTRAVENOUS | Status: AC
  Administered 2014-06-23: 60 mg/h via INTRAVENOUS

## 2014-06-23 MED ORDER — AMIODARONE HCL IN DEXTROSE 360-4.14 MG/200ML-% IV SOLN
30.0000 mg/h | INTRAVENOUS | Status: DC
  Administered 2014-06-24: 30 mg/h via INTRAVENOUS
  Filled 2014-06-23 (×6): qty 200

## 2014-06-23 MED ORDER — CALCIUM GLUCONATE 10 % IV SOLN
1.0000 g | Freq: Once | INTRAVENOUS | Status: AC
  Administered 2014-06-23: 1 g via INTRAVENOUS
  Filled 2014-06-23: qty 10

## 2014-06-23 MED ORDER — DEXTROSE 5 % IV SOLN: 2.0000 g | Freq: Once | INTRAVENOUS | Status: DC

## 2014-06-23 MED ORDER — DEXTROSE 5 % IV SOLN
30.0000 ug/min | INTRAVENOUS | Status: DC
  Administered 2014-06-23: 75 ug/min via INTRAVENOUS
  Administered 2014-06-24: 40 ug/min via INTRAVENOUS
  Administered 2014-06-24: 90 ug/min via INTRAVENOUS
  Administered 2014-06-25: 80 ug/min via INTRAVENOUS
  Administered 2014-06-25: 90 ug/min via INTRAVENOUS
  Administered 2014-06-25: 200 ug/min via INTRAVENOUS
  Filled 2014-06-23 (×8): qty 4

## 2014-06-23 NOTE — Consult Note (Deleted)
[facility] Consult: 1:30 PM 06/23/2014  LOS: 2 days    Referring Provider: Dr Chase Caller.  Primary Care Physician:  Alesia Richards, MD Primary Gastroenterologist:  Dr. Deatra Ina.      Reason for Consultation:  Pancreatitis.    HPI: [name] is a 64 y.o. male.  Hx htn, hld.  Chronic kidney disease (baseline stage 3a, acutely stage 4 at present). Chronic hydronephrosis since 2009.  S/p TURP 02/2013.  Previous left inguinal hernia repair with mesh. Hx iron def anemia ~ 2010. EGD then with ulcer, gastritis, HH with stomach in chest.  HH was surgically repaired with Nissen and LOA 2010 (Dr Ronnald Collum).    For evaluation of chronic abdominal pain, Dr Deatra Ina performed colonoscopy 04/22/2014: colon adenomas.  04/22/2014 EGD Duodenitis, gastritis, retained gastric contents.  Gastroparesis on 05/13/14 gastric emptying study (79% retention at 2 hours).  Abnormal LFTs in 03/2014 with AST 182 and ALT 317. Alkaline phosphatase normal.  Normal Hep A and C serologies. Hep B surface antibody positive but core antibody negative.  Abdominal ultrasound 06/02/14 with small liver cysts and renal cysts, obscured pancreas 5.5 mm CBD, contracted GB but no stones or wall thickening.   Abnormal LFTs 05/22/14 ( AST/ALT 180s/317, normal alk phos, t bili 1.4).    ANA and AMA ordered 06/20/14 are pending.    Seen 06/20/13 by Dr Deatra Ina for follow up of epigastric and periumbilical abdominal pain, worsenened post prandialy. 10 # weight loss. Abnormal LFTs.  Metoclopramide had not helped. Continued on daily Omeprazole 40 mg.   Admitted 06/29/2014 with multi organ failure, diffuse abdominal pain, pancreatitis, elevated lactate/acidotic, DIC, skin mottling, hypothermia, hypoglycemic.  Required intubation/vent in ED.   CT abdomen/pelvis 2/6:  Possible  pseudoaneurysm at branch of left gastric artery near surgical clips.  Suspect acute pancreatitis and associated duodenitis.  + proctitis. Diffuse hepatic steatosis. Widely patent visceral arteries.  Bil hydronephrosis. Small thyroid nodules. LFTs rising. Renal function progressively worsening.  Currently the AST is 170s to 180s, ALT is 160s to 150s.  Normal alk phos, t bili 3.9.  Lipase 359 - 265.   Went to exploratory laparotomy 07/10/2014 (Dr Redmond Pulling).  No evidence of ischemic large or small bowel.  Small mesenteric tear in SB was repaired. "Pancreas felt hardened".  Post op diagnosis was of acute pancreatitis.   Currently comfortable on Fentanyl gtt, Protonix 40 IV q day. On Levophed, vasopressin for pressure support.  Nods head "no" to question of drinking ETOH.     PREVIOUS ENDOSCOPIC STUDIES: 2/21015  Colonoscopy: screening study, mother died in her 45s with colon cancer. ENDOSCOPIC IMPRESSION: 1. Sessile polyp was found at the cecum; polypectomy was performedwith a cold snare 2. Sessile polyp was found in the descending colon; polypectomywas performed with a cold snare 3. The examination was otherwise normal Cecal polyp pathology:  - TUBULAR ADENOMA (X1). - BENIGN COLORECTAL MUCOSA, ADDITIONAL FRAGMENTS. - NO HIGH GRADE DYSPLASIA OR MALIGNANCY IDENTIFIED.  04/2014  EGD: for epigastric pain ENDOSCOPIC IMPRESSION: 1. Duodenal inflammation was found in the duodenal bulb 2. Retained gastric contents  3. Chronic gastritis (inflammation) was found in the gastric fundus; multiple biopsies were performed Gastric pathology - BENIGN OXYNTIC GASTRIC MUCOSA WITH MILD CHRONIC INACTIVE GASTRITIS. - WARTHIN-STARRY STAIN IS NEGATIVE FOR HELICOBACTER PYLORI. - NO INTESTINAL METAPLASIA, DYSPLASIA, OR MALIGNANCY.  2010 Colonoscopy. normal but suboptimal study due to retained stool.   2010 EGD.  Antral ulcer, hemorrhagic gastritis, large HH with stomach in chest/malrotation.   Past  Medical History  Diagnosis Date  . Hyperlipidemia   . HTN (hypertension)   . Allergic rhinitis   . Headache(784.0)   . Nocturia   . Urinary retention   . Foley catheter in place   . UTI (urinary tract infection)   . GERD (gastroesophageal reflux disease)   . Prediabetes   . Depression   . Arthritis   . Bell's palsy     Past Surgical History  Procedure Laterality Date  . Hernia repair      inguinal /left  . Nissen fundoplication    . Laparoscopic lysis intestinal adhesions    . Transurethral resection of prostate N/A 03/20/2013    Procedure: TRANSURETHRAL RESECTION OF THE PROSTATE WITH GYRUS INSTRUMENTS;  Surgeon: Molli Hazard, MD;  Location: [facility];  Service: Urology;  Laterality: N/A;  . Carpal tunnel release      left hand  . Nerve surgery      left arm  . Laparotomy N/A 06/24/2014    Procedure: EXPLORATORY LAPAROTOMY ;  Surgeon: Gayland Curry, MD;  Location: [facility];  Service: General;  Laterality: N/A;    Prior to Admission medications   Medication Sig Start Date End Date Taking? Authorizing Provider  atenolol (TENORMIN) 100 MG tablet Take 1 tablet (100 mg total) by mouth daily. 07/29/13 07/29/14 Yes Unk Pinto, MD  atorvastatin (LIPITOR) 80 MG tablet Take 1 tablet (80 mg total) by mouth 3 (three) times a week. Patient taking differently: Take 40-80 mg by mouth 3 (three) times a week.  07/29/13  Yes Unk Pinto, MD  finasteride (PROSCAR) 5 MG tablet Take 5 mg by mouth daily.  07/30/13  Yes Historical Provider, MD  hyoscyamine (LEVSIN, ANASPAZ) 0.125 MG tablet Take 0.125 mg by mouth every 4 (four) hours as needed for bladder spasms.   Yes Historical Provider, MD  meloxicam (MOBIC) 15 MG tablet 1/2 to 1 tablet daily with food for arthritis pain and inflammation Patient taking differently: Take 7.5-15 mg by mouth daily. Take  with food for arthritis pain and inflammation 08/07/13  Yes Unk Pinto, MD  metoCLOPramide (REGLAN) 10 MG tablet Take 1 tablet (10 mg  total) by mouth 4 (four) times daily -  before meals and at bedtime. 05/20/14  Yes Inda Castle, MD  omeprazole (PRILOSEC OTC) 20 MG tablet Take 20 mg by mouth daily.   Yes Historical Provider, MD  omeprazole (PRILOSEC) 40 MG capsule Take 1 capsule (40 mg total) by mouth daily. 05/22/14  Yes Vicie Mutters, PA-C  oxyCODONE-acetaminophen (PERCOCET/ROXICET) 5-325 MG per tablet Take 1-2 tablets by mouth every 4 (four) hours as needed for severe pain.   Yes Historical Provider, MD  tamsulosin (FLOMAX) 0.4 MG CAPS capsule Take 0.4 mg by mouth at bedtime.    Yes Historical Provider, MD  B Complex-C (SUPER B COMPLEX PO) Take 1 tablet by mouth every morning.    Historical Provider, MD  Cholecalciferol (VITAMIN D PO) Take 2,000 Int'l Units by mouth daily.    Historical Provider, MD  cyclobenzaprine (FLEXERIL) 10 MG tablet Take 10 mg by mouth 3 (  three) times daily as needed for muscle spasms.    Historical Provider, MD  Magnesium 500 MG TABS Take 1 tablet by mouth daily.    Historical Provider, MD  Multiple Vitamin (MULTIVITAMIN WITH MINERALS) TABS tablet Take 1 tablet by mouth every morning.     Historical Provider, MD  omega-3 acid ethyl esters (LOVAZA) 1 G capsule Take by mouth 2 (two) times daily.    Historical Provider, MD    Scheduled Meds: . sodium chloride   Intravenous Once  . sodium chloride   Intravenous Once  . antiseptic oral rinse  7 mL Mouth Rinse QID  . chlorhexidine  15 mL Mouth Rinse BID  . imipenem-cilastatin  250 mg Intravenous Q6H  . pantoprazole (PROTONIX) IV  40 mg Intravenous QHS   Infusions: . fentaNYL infusion INTRAVENOUS 100 mcg/hr (06/23/14 0826)  . norepinephrine (LEVOPHED) Adult infusion 18 mcg/min (06/23/14 1200)  .  sodium bicarbonate  infusion 1000 mL 175 mL/hr at 06/23/14 0849  . vasopressin (PITRESSIN) infusion - *FOR SHOCK* 0.03 Units/min (06/23/14 0600)   PRN Meds: sodium chloride, fentaNYL   Allergies as of 07/13/2014 - Review Complete 06/17/2014  Allergen  Reaction Noted  . Chloraseptic sore throat [acetaminophen] Other (See Comments) 04/17/2014  . Milk-related compounds Other (See Comments) 04/17/2014    Family History  Problem Relation Age of Onset  . Colon cancer Mother   . Prostate cancer Father   . Arthritis Brother   . Fibromyalgia Brother     History   Social History  . Marital Status: Widowed    Spouse Name: N/A    Number of Children: N/A  . Years of Education: N/A   Occupational History  . Not on file.   Social History Main Topics  . Smoking status: Passive Smoke Exposure - Never Smoker -- 34 years  . Smokeless tobacco: Former Systems developer    Types: [place] date: 01/03/1978     Comment: Back in the 70's  . Alcohol Use: No  . Drug Use: No  . Sexual Activity: Not Currently   Other Topics Concern  . Not on file   Social History Narrative    REVIEW OF SYSTEMS: Constitutional:  Falls at home PTA.  ENT:  No nose bleeds Pulm:  No breathing problems CV:  No palpitations, no LE edema, no chest pain.  GU:  No hematuria, no frequency GI:  Per HPI Heme:  No issues with bleeding or low blood counts   Transfusions:  None in records Neuro:  No headaches, no peripheral tingling or numbness Derm:  No itching, no rash or sores.  Endocrine:  No sweats or chills.  No polyuria or dysuria Immunization:  Reviewed and multiple vaccines up to date, including flu and pnvx in 01/2014.  Travel:  None beyond local counties in last few months.    PHYSICAL EXAM: Vital signs in last 24 hours: Filed Vitals:   06/23/14 1200  BP:   Pulse: 83  Temp: 100.4 F (38 C)  Resp:    Wt Readings from Last 3 Encounters:  06/18/2014 208 lb 12.4 oz (94.7 kg)  06/20/14 197 lb 2 oz (89.415 kg)  05/22/14 209 lb (94.802 kg)   General: comfortable on went, pale but not toxic appearing.   Head:  No swelling or asymmetry  Eyes:  Slight icteric sclera.  No conj pallor Ears:  Not obviously hearing impaired  Nose:  No congestion or  discharge Mouth:  Intubated, no blood per os.  Neck:  No mass, no JVD.   Lungs:  No resp distress, breathing is quiet/unlabored on vent.  Heart: RRR.  No MRG Abdomen:  Soft, NT, quiet, slightly tense.  Large lap bandage is c/d/i, was not removed for exam.  .   Rectal: deferrred GU: amber colored urine in foley   Musc/Skeltl: no joint swelling or pain Extremities:  Cool feet with some mottling, mottling minor on arms as well.    Neurologic:  Follows commands, alert, appropriate responses to questions and requests.  Skin/Derm:  Severe onychomycosis on toenails. Mottling as above. No telangectasia Tattoos:  none   Psych:  Not agitated.  Quite relaxed on vent but "mittens" in place.   Intake/Output from previous day: 02/07 0701 - 02/08 0700 In: 8852.8 [I.V.:6651.8; Blood:615; IV Piggyback:1586] Out: 2778 [Urine:1317] Intake/Output this shift: Total I/O In: 1400.1 [I.V.:1140.1; IV Piggyback:260] Out: 185 [Urine:185]  LAB RESULTS:  Recent Labs  06/18/2014 1000 06/17/2014 1400 06/23/14 0500  WBC 10.0 6.3 14.1*  HGB 11.2* 10.5* 10.7*  HCT 32.4* 31.0* 31.1*  PLT 56*  54* 46* 62*   BMET Lab Results  Component Value Date   NA 141 06/23/2014   NA 141 07/06/2014   NA 142 06/17/2014   K 4.7 06/23/2014   K 4.8 06/19/2014   K 5.1 07/02/2014   CL 103 06/23/2014   CL 109 07/13/2014   CL 109 07/08/2014   CO2 12* 06/23/2014   CO2 10* 07/03/2014   CO2 12* 06/27/2014   GLUCOSE 98 06/23/2014   GLUCOSE 107* 07/13/2014   GLUCOSE 101* 07/12/2014   BUN 47* 06/23/2014   BUN 44* 07/06/2014   BUN 42* 07/12/2014   CREATININE 3.98* 06/23/2014   CREATININE 3.51* 07/13/2014   CREATININE 3.27* 07/09/2014   CALCIUM 5.3* 06/23/2014   CALCIUM 5.3* 06/17/2014   CALCIUM 5.5* 06/25/2014   LFT  Recent Labs  07/05/2014 1445 07/12/2014 2222 07/07/2014 0310 07/12/2014 2055 06/23/14 0500  PROT 4.2* 3.4* 4.0*  --   --   ALBUMIN 2.5* 1.8* 2.2* 1.9* 1.8*  AST 191* 175* 182*  --   --   ALT 228* 163*  154*  --   --   ALKPHOS 81 70 69  --   --   BILITOT 4.4* 3.6* 3.9*  --   --   BILIDIR  --   --  2.9*  --   --   IBILI  --   --  1.0*  --   --    PT/INR Lab Results  Component Value Date   INR 1.91* 06/23/2014   INR 2.11* 07/13/2014   INR 2.11* 07/01/2014   Hepatitis Panel No results for input(s): HEPBSAG, HCVAB, HEPAIGM, HEPBIGM in the last 72 hours. C-Diff No components found for: CDIFF Lipase     Component Value Date/Time   LIPASE 265* 06/27/2014 1000    Drugs of Abuse  No results found for: LABOPIA, COCAINSCRNUR, LABBENZ, AMPHETMU, THCU, LABBARB   RADIOLOGY STUDIES: US Renal  06/17/2014   CLINICAL DATA:  Acute renal failure.  Hydronephrosis  EXAM: RENAL/URINARY TRACT ULTRASOUND COMPLETE  COMPARISON:  CT 07/04/2014  FINDINGS: Right Kidney:  Length: 8.6 cm. 13 mm upper pole cyst. Negative for hydronephrosis. Renal cortex normal in echogenicity.  Left Kidney:  Length: 9.7 cm. 12 mm upper pole cyst. Echogenicity within normal limits. No mass or hydronephrosis visualized.  Bladder:  Urinary bladder is empty with a Foley catheter.  Small amount of free fluid in the abdomen.  IMPRESSION: Small right kidney.  Negative  for hydronephrosis.   Electronically Signed   By: Franchot Gallo M.D.   On: 06/20/2014 18:54   Dg Chest Port 1 View  06/18/2014   CLINICAL DATA:  Respiratory failure  EXAM: PORTABLE CHEST - 1 VIEW  COMPARISON:  06/30/2014  FINDINGS: Mild left basilar atelectasis. No focal consolidation. No pleural effusion or pneumothorax.  Endotracheal tube terminates 4.5 cm above the carina.  Left IJ venous catheter terminates in the mid SVC.  Enteric tube terminates in the gastric cardia.  IMPRESSION: No evidence of acute cardiopulmonary disease.  Endotracheal tube terminates 4.5 cm above the carina.  Additional support apparatus as above.   Electronically Signed   By: Julian Hy M.D.   On: 07/11/2014 09:52   Dg Chest Portable 1 View  06/30/2014   CLINICAL DATA:  Post intubation and  central line placement.  EXAM: PORTABLE CHEST - 1 VIEW  COMPARISON:  CT chest and chest radiograph 06/19/2014.  FINDINGS: Endotracheal tube terminates approximately 2.2 cm above the carina. Left IJ central line tip projects over the upper SVC. Heart size stable. There may be minimal subsegmental atelectasis at the lung bases. Lungs are low in volume. No pleural fluid. No pneumothorax.  IMPRESSION: 1. Endotracheal tube terminates approximately 2.2 cm above the carina. Retracting approximately 1 cm would better position the tip above the carina. 2. Left IJ central line placement without pneumothorax.   Electronically Signed   By: Lorin Picket M.D.   On: 06/24/2014 18:39   Dg Chest Portable 1 View  06/18/2014   CLINICAL DATA:  Trauma patient. Loss of consciousness following 2x earlier today. Patient now complaining of abdominal, back and bilateral leg pain.  EXAM: PORTABLE CHEST - 1 VIEW  COMPARISON:  03/18/2013  FINDINGS: Apparent enlargement of the cardiac silhouette and mediastinal contours, in particular, there is apparent enlargement of the ascending and descending thoracic aorta. No focal airspace opacities. No pleural effusion or pneumothorax. No evidence of edema. No acute osseus abnormalities.  IMPRESSION: Apparent enlargement of the cardiac silhouette and mediastinal contours, possibly accentuated due to decreased lung volumes and patient rotation though a thoracic aortic aneurysm and/or dissection could have a similar appearance. Further evaluation could be performed with PA and lateral chest radiograph and/or chest CTA as clinically indicated.  Critical Value/emergent results were called by telephone at the time of interpretation on 06/17/2014 at 3:13 pm to Dr. Debby Freiberg , who verbally acknowledged these results.   Electronically Signed   By: Sandi Mariscal M.D.   On: 07/10/2014 15:15   Dg Abd Portable 1v  07/03/2014   CLINICAL DATA:  Evaluate nasogastric tube placement.  EXAM: PORTABLE ABDOMEN - 1  VIEW  COMPARISON:  Chest radiograph of 1 day prior  FINDINGS: Motion degraded portable supine view. Nasogastric tube is looped in the stomach, with tip at proximal gastric body. Gas within the colon. No definite small bowel distention. The right side of the abdomen is excluded.  IMPRESSION: Nasogastric tube looped in the stomach, with tip at proximal stomach.   Electronically Signed   By: Abigail Miyamoto M.D.   On: 06/24/2014 09:51   Ct Angio Chest Aorta W/cm &/or Wo/cm Ct Cta Abd/pel W/cm &/or W/o Cm 07/05/2014   CLINICAL DATA:  At least 2 unwitnessed falls at home earlier today. Decreasing a blood pressure while in the emergency department. Acute onset abdominal pain, lumbar back pain and bilateral lower extremity pain. Surgical history includes Nissen fundoplication, light cysts of intestinal adhesions, and TURP.  EXAM: CT  ANGIOGRAPHY CHEST, ABDOMEN AND PELVIS  TECHNIQUE: Multidetector CT imaging through the chest, abdomen and pelvis was performed using the standard protocol before and during bolus administration of intravenous contrast. Multiplanar reconstructed images and MIPs were obtained and reviewed to evaluate the vascular anatomy.  CONTRAST:  100 ml Omnipaque 350 IV.  It is noted that the patient has an elevated BUN of 48 and creatinine of 3.90. However, the emergency department physician felt that the current symptoms warranted the use of intravenous iodinated contrast in order to exclude an aortic dissection.  COMPARISON:  CT abdomen and pelvis 02/13/2013, 01/03/2013, 04/28/2008. CT chest 04/28/2008.  FINDINGS: CTA CHEST FINDINGS  Respiratory motion blurs images throughout the examination of the chest, rendering the examination less than optimal. Unenhanced images demonstrate no evidence of mural hematoma involving the thoracic aorta. Enhanced images demonstrate no evidence of dissection or aneurysm. Mild atherosclerosis is present involving the descending thoracic aorta. Proximal great vessels widely  patent.  Heart size normal, though there may be mild left atrial enlargement. No visible coronary atherosclerosis. No pericardial effusion. Enlarged main pulmonary arteries, the right measuring up to 3.5 cm approximately diameter and the left up to approximately 3.0 cm diameter. No convincing evidence of central pulmonary embolism, though the timing of contrast enhancement was not optimal for visualization of the pulmonary arteries.  Emphysematous changes throughout both lungs. Pulmonary parenchyma clear without localized airspace consolidation, interstitial disease, or parenchymal nodules or masses. No pleural effusions. Central airways patent.  No significant mediastinal, hilar or axillary lymphadenopathy. Thyroid gland normal in size containing numerous small nodules, the largest approximating 8 mm. Fluid throughout the esophagus. Small hiatal hernia which will be discussed below.  Bone window images demonstrate degenerative disc disease and spondylosis involving the lower cervical spine with sclerosis involving the C7 vertebral body. No sclerotic vertebrae elsewhere in the visualized lower cervical were thoracic spine. Mild osseous demineralization is suspected.  Review of the MIP images confirms the above findings.  CTA ABDOMEN AND PELVIS FINDINGS  Respiratory motion blurs many of the images of the abdomen, particularly the upper abdomen, rendering the examination less than optimal. Unenhanced images demonstrate no evidence of mural thrombus involving the upper abdominal aorta. Enhanced images demonstrate mild atherosclerosis involving the distal abdominal aorta without evidence of aneurysm or dissection. Mild left common iliac atherosclerosis. Widely patent celiac artery, SMA, and IMA. Widely patent single renal arteries bilaterally.  There is a round collection of contrast adjacent to or involving the wall of the distal esophagus or gastric cardia at the EG junction, best seen on the coronal reformatted  images, measuring approximately 1.9 x 1.3 cm. This is immediately adjacent to surgical clips placed at the time of the prior Nissen procedure. Small to moderate sized recurrent hiatal hernia. Fluid-filled stomach which is not distended.  Severe diffuse hepatic steatosis. The liver is suboptimally imaged due to the respiratory motion, but no focal parenchymal abnormality is identified. Similarly, the spleen is unremarkable. I suspect diffuse enlargement of the pancreas and associated peripancreatic edema/inflammation. There is also edema/inflammation surrounding the descending portion of the duodenum.  Normal adrenal glands. Hydroureteronephrosis involving both kidneys without evidence of obstructing stone or mass. Severe scarring involving both kidneys. Patchy enhancement the kidneys consistent with the renal insufficiency. Cortical cysts involving both kidneys. No visible solid renal masses. No significant lymphadenopathy.  The remainder the small bowel is unremarkable. Moderate stool burden in the ascending and transverse colon, with borderline gaseous distension of the transverse colon. Descending colon, sigmoid colon and rectum  relatively decompressed. Wall thickening over a several cm segment of the distal rectum to the anal verge. No free intraperitoneal air. Small amount of ascites dependently in the pelvis.  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 No visible masses at either inferior orifice to account for the bilateral hydronephrosis. 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 Phleboliths low in both sides of the pelvis.  Bone window images demonstrate degenerative disc disease and spondylosis at L1-2 and L2-3, facet degenerative changes at L4-5 and L5-S1, and bone islands in the right iliac bone.  Review of the MIP images confirms the above findings.  IMPRESSION: 1. The examination was significantly degraded by respiratory motion. 2. No evidence of thoracic or abdominal aortic  aneurysm or dissection. 3. 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 4. Acute pancreatitis is suspected. 5. Acute duodenitis is also suspected. This may be secondary to the presumed pancreatitis. 6. Proctitis involving the distal rectum to the level of the anal verge. This likely explains the bright red blood per rectum. 7. Severe diffuse hepatic steatosis. 8. Bilateral hydronephrosis without obstructing stone or mass. As there is evidence of a diverticulum arising from the right lateral wall of the bladder, the hydronephrosis could be secondary to neurogenic bladder. Clinical correlation. 9. Scarring diffusely throughout both kidneys. 10. Widely patent visceral arteries. 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  No acute cardiopulmonary disease. 12. Enlarged main pulmonary arteries, measured above, consistent with pulmonary arterial hypertension. 13. Multiple subcentimeter nodules within a normal sized thyroid gland. This does not require further imaging followup. This follows ACR consensus guidelines: Managing Incidental Thyroid Nodules Detected on Imaging: White Paper of the ACR Incidental Thyroid Findings Committee. J Am Coll Radiol 2015; 12:143-150.  These results were discussed directly by telephone with Dr. Colin Rhein of the emergency department at 1625 hr and 1630 hr. She   Electronically Signed   By: Evangeline Dakin M.D.   On: 07/02/2014 16:46      IMPRESSION:   *  Acute pancreatitis.  ? Ischemic etiology.  ? Biliary etiology.   Acute on chronic (dates to at least 2009) abdominal pain EGD and colonoscopy 04/2014: duodenitis, gastritis, retained gastric contents.  Adenomatous colon polyps. Duodenitis on CT scan, may be secondary to the pancreatitis.   *  S/p 2/7 ex lap for suspicion of ischemic bowel.  Bowel unremarkable, surgeon did sew small tear in SB mesentery and lyse a few  adhesions.  *  Gastroparesis.  Slow emptying on nuc med GES study 05/13/14.    *  Multi-organ failure.  In setting of sepsis/shock. On pressors.   *  Blood per rectum. CT scan shows proctitis.  No proctitis on colonoscopy 04/22/2014.  Suspect ischemic.   *  Possible pseudoaneurysm of left gastric artery branch.  *  Hepatic steatosis.  *  Bilateral hydronephrosis without obstructing stone or mass.  *  Normocytic anemia.  *  Thrombocytopenia.  *  Coagulopathy. DIC.    *  Nissen fundoplication 9417.   PLAN:      *  Ok to initiate tube feeds, if ok with surgery. Start low and increase rate slowly given hx of gastroparesis.   *  Dr Olevia Perches will be seeing pt later today.    Azucena Freed  06/23/2014, 1:30 PM Pager: [phone]

## 2014-06-23 NOTE — Progress Notes (Signed)
CRITICAL VALUE ALERT  Critical value received:  CKMB 7.5  Date of notification:  06/23/14  Time of notification:  1600  Critical value read back:Yes.    Nurse who received alert:  Rosine Door  MD notified (1st page):  Dr. Nelda Marseille  Time of first page:  1602  MD notified (2nd page):  Time of second page:  Responding MD:  Dr. Nelda Marseille  Time MD responded:  [phone]

## 2014-06-23 NOTE — Progress Notes (Signed)
Echocardiogram 2D Echocardiogram has been performed.  [name] 06/23/2014, 10:32 AM

## 2014-06-23 NOTE — Progress Notes (Signed)
Patient ID: [name], male   DOB: 1950/11/16, 64 y.o.   MRN: [id number]     [address] [place], [place] [address]    Phone: [phone] FAX: [phone]     Subjective: WBC up today.  Requiring pressors.  BP low.    Objective:  Vital signs:  Filed Vitals:   06/23/14 0800 06/23/14 0859 06/23/14 0900 06/23/14 1000  BP: 87/67 105/59 89/65   Pulse: 78 74 73 88  Temp: 100.1 F (37.8 C)  100.2 F (37.9 C) 100.2 F (37.9 C)  TempSrc:      Resp:      Height:      Weight:      SpO2: 100% 100% 100% 98%    Last BM Date:  (pta)  Intake/Output   Yesterday:  02/07 0701 - 02/08 0700 In: 8852.8 [I.V.:6651.8; Blood:615; IV SELTRVUYE:3343] Out: 5686 [Urine:1317] This shift:  Total I/O In: 1140 [I.V.:880; IV Piggyback:260] Out: 185 [Urine:185]   Physical Exam: General: Pt awake on vent. Abdomen: Soft.  Nondistended.  Hypoactive bowel sounds.  Dressing is c/d/i.       Problem List:   Active Problems:   Acute respiratory failure   Acute renal failure   Acidosis   Lactic acidosis   Acute pancreatitis   Metabolic acidosis   Shock    Results:   Labs: Results for orders placed or performed during the hospital encounter of 06/24/2014 (from the past 48 hour(s))  Comprehensive metabolic panel     Status: Abnormal   Collection Time: 07/05/2014  2:45 PM  Result Value Ref Range   Sodium 140 135 - 145 mmol/L   Potassium 5.4 (H) 3.5 - 5.1 mmol/L   Chloride 103 96 - 112 mmol/L   CO2 7 (LL) 19 - 32 mmol/L    Comment: REPEATED TO VERIFY CRITICAL RESULT CALLED TO, READ BACK BY AND VERIFIED WITH: J.DAVIS,RN 07/08/2014 @ 1711 BY V.WILKINS    Glucose, Bld 118 (H) 70 - 99 mg/dL   BUN 52 (H) 6 - 23 mg/dL   Creatinine, Ser 4.15 (H) 0.50 - 1.35 mg/dL   Calcium 8.1 (L) 8.4 - 10.5 mg/dL   Total Protein 4.2 (L) 6.0 - 8.3 g/dL   Albumin 2.5 (L) 3.5 - 5.2 g/dL   AST 191 (H) 0 - 37 U/L   ALT 228 (H) 0 - 53 U/L     Comment: RESULTS CONFIRMED BY MANUAL DILUTION   Alkaline Phosphatase 81 39 - 117 U/L   Total Bilirubin 4.4 (H) 0.3 - 1.2 mg/dL   GFR calc non Af Amer 14 (L) >90 mL/min   GFR calc Af Amer 16 (L) >90 mL/min    Comment: (NOTE) The eGFR has been calculated using the CKD EPI equation. This calculation has not been validated in all clinical situations. eGFR's persistently <90 mL/min signify possible Chronic Kidney Disease.    Anion gap 30 (H) 5 - 15  Protime-INR     Status: Abnormal   Collection Time: 06/30/2014  2:45 PM  Result Value Ref Range   Prothrombin Time 25.9 (H) 11.6 - 15.2 seconds   INR 2.34 (H) 0.00 - 1.49  Type and screen for Red Blood Exchange     Status: None (Preliminary result)   Collection Time: 06/19/2014  2:45 PM  Result Value Ref Range   ABO/RH(D) A POS    Antibody Screen NEG    Sample Expiration 06/24/2014  Unit Number [id number]    Blood Component Type RED CELLS,LR    Unit division 00    Status of Unit ALLOCATED    Transfusion Status OK TO TRANSFUSE    Crossmatch Result Compatible    Unit Number [id number]    Blood Component Type RED CELLS,LR    Unit division 00    Status of Unit ALLOCATED    Transfusion Status OK TO TRANSFUSE    Crossmatch Result Compatible    Unit Number [id number]    Blood Component Type RED CELLS,LR    Unit division 00    Status of Unit ALLOCATED    Transfusion Status OK TO TRANSFUSE    Crossmatch Result Compatible    Unit Number [id number]    Blood Component Type RED CELLS,LR    Unit division 00    Status of Unit ALLOCATED    Transfusion Status OK TO TRANSFUSE    Crossmatch Result Compatible   ABO/Rh     Status: None   Collection Time: 06/16/2014  2:45 PM  Result Value Ref Range   ABO/RH(D) A POS   CBG monitoring, ED     Status: Abnormal   Collection Time: 06/16/2014  2:48 PM  Result Value Ref Range   Glucose-Capillary 61 (L) 70 - 99 mg/dL  I-Stat arterial blood gas, ED     Status: Abnormal   Collection Time:  07/11/2014  2:55 PM  Result Value Ref Range   pH, Arterial 7.034 (LL) 7.350 - 7.450   pCO2 arterial 12.2 (LL) 35.0 - 45.0 mmHg   pO2, Arterial 192.0 (H) 80.0 - 100.0 mmHg   Bicarbonate 3.2 (L) 20.0 - 24.0 mEq/L   TCO2 <5 0 - 100 mmol/L   O2 Saturation 99.0 %   Acid-base deficit 25.0 (H) 0.0 - 2.0 mmol/L   Patient temperature 98.6 F    Collection site RADIAL, ALLEN'S TEST ACCEPTABLE    Drawn by Operator    Sample type ARTERIAL    Comment MD NOTIFIED, REPEAT TEST   I-stat troponin, ED (not at [facility])     Status: None   Collection Time: 07/02/2014  3:01 PM  Result Value Ref Range   Troponin i, poc 0.02 0.00 - 0.08 ng/mL   Comment 3            Comment: Due to the release kinetics of cTnI, a negative result within the first hours of the onset of symptoms does not rule out myocardial infarction with certainty. If myocardial infarction is still suspected, repeat the test at appropriate intervals.   I-stat chem 8, ed     Status: Abnormal   Collection Time: 07/03/2014  3:03 PM  Result Value Ref Range   Sodium 139 135 - 145 mmol/L   Potassium 5.0 3.5 - 5.1 mmol/L   Chloride 104 96 - 112 mmol/L   BUN 48 (H) 6 - 23 mg/dL   Creatinine, Ser 3.90 (H) 0.50 - 1.35 mg/dL   Glucose, Bld 107 (H) 70 - 99 mg/dL   Calcium, Ion 0.98 (L) 1.13 - 1.30 mmol/L   TCO2 6 0 - 100 mmol/L   Hemoglobin 16.3 13.0 - 17.0 g/dL   HCT 48.0 39.0 - 52.0 %   Comment NOTIFIED PHYSICIAN   I-Stat CG4 Lactic Acid, ED     Status: Abnormal   Collection Time: 06/28/2014  3:03 PM  Result Value Ref Range   Lactic Acid, Venous >17.00 (HH) 0.5 - 2.0 mmol/L   Comment NOTIFIED PHYSICIAN   CBC with Differential/Platelet  Status: Abnormal   Collection Time: 06/23/2014  4:00 PM  Result Value Ref Range   WBC 24.7 (H) 4.0 - 10.5 K/uL    Comment: WHITE COUNT CONFIRMED ON SMEAR   RBC 4.17 (L) 4.22 - 5.81 MIL/uL   Hemoglobin 13.9 13.0 - 17.0 g/dL   HCT 42.6 39.0 - 52.0 %   MCV 102.2 (H) 78.0 - 100.0 fL   MCH 33.3 26.0 - 34.0 pg   MCHC  32.6 30.0 - 36.0 g/dL   RDW 16.4 (H) 11.5 - 15.5 %   Platelets 104 (L) 150 - 400 K/uL    Comment: PLATELET COUNT CONFIRMED BY SMEAR   Neutrophils Relative % 87 (H) 43 - 77 %   Neutro Abs 21.5 (H) 1.7 - 7.7 K/uL   Lymphocytes Relative 6 (L) 12 - 46 %   Lymphs Abs 1.5 0.7 - 4.0 K/uL   Monocytes Relative 7 3 - 12 %   Monocytes Absolute 1.7 (H) 0.1 - 1.0 K/uL   Eosinophils Relative 0 0 - 5 %   Eosinophils Absolute 0.0 0.0 - 0.7 K/uL   Basophils Relative 0 0 - 1 %   Basophils Absolute 0.0 0.0 - 0.1 K/uL   Smear Review MORPHOLOGY UNREMARKABLE   CBG monitoring, ED     Status: Abnormal   Collection Time: 07/07/2014  4:08 PM  Result Value Ref Range   Glucose-Capillary 65 (L) 70 - 99 mg/dL   Comment 1 Documented in Chart    Comment 2 Notify RN   Blood culture (routine x 2)     Status: None (Preliminary result)   Collection Time: 06/24/2014  5:30 PM  Result Value Ref Range   Specimen Description BLOOD RIGHT ARM    Special Requests BOTTLES DRAWN AEROBIC AND ANAEROBIC 10CC EA    Culture             BLOOD CULTURE RECEIVED NO GROWTH TO DATE CULTURE WILL BE HELD FOR 5 DAYS BEFORE ISSUING A FINAL NEGATIVE REPORT Note: Culture results may be compromised due to an excessive volume of blood received in culture bottles. Performed at [facility]    Report Status PENDING   Blood culture (routine x 2)     Status: None (Preliminary result)   Collection Time: 06/18/2014  5:40 PM  Result Value Ref Range   Specimen Description BLOOD RIGHT HAND    Special Requests BOTTLES DRAWN AEROBIC ONLY 10CC    Culture             BLOOD CULTURE RECEIVED NO GROWTH TO DATE CULTURE WILL BE HELD FOR 5 DAYS BEFORE ISSUING A FINAL NEGATIVE REPORT Note: Culture results may be compromised due to an excessive volume of blood received in culture bottles. Performed at [facility]    Report Status PENDING   I-Stat CG4 Lactic Acid, ED     Status: Abnormal   Collection Time: 07/05/2014  5:54 PM  Result Value Ref  Range   Lactic Acid, Venous >17.00 (HH) 0.5 - 2.0 mmol/L   Comment NOTIFIED PHYSICIAN   I-Stat arterial blood gas, ED     Status: Abnormal   Collection Time: 06/20/2014  6:22 PM  Result Value Ref Range   pH, Arterial 6.939 (LL) 7.350 - 7.450   pCO2 arterial 21.0 (L) 35.0 - 45.0 mmHg   pO2, Arterial 174.0 (H) 80.0 - 100.0 mmHg   Bicarbonate 4.5 (L) 20.0 - 24.0 mEq/L   TCO2 5 0 - 100 mmol/L   O2 Saturation 98.0 %  Acid-base deficit 27.0 (H) 0.0 - 2.0 mmol/L   Patient temperature 98.6 F    Collection site RADIAL, ALLEN'S TEST ACCEPTABLE    Drawn by Operator    Sample type ARTERIAL    Comment MD NOTIFIED, REPEAT TEST   POC CBG, ED     Status: None   Collection Time: 07/10/2014  6:40 PM  Result Value Ref Range   Glucose-Capillary 98 70 - 99 mg/dL  CBG monitoring, ED     Status: Abnormal   Collection Time: 06/18/2014  6:49 PM  Result Value Ref Range   Glucose-Capillary 110 (H) 70 - 99 mg/dL  Prepare fresh frozen plasma     Status: None   Collection Time: 07/02/2014  9:00 PM  Result Value Ref Range   Unit Number [id number]    Blood Component Type THAWED PLASMA    Unit division 00    Status of Unit ISSUED,FINAL    Transfusion Status OK TO TRANSFUSE    Unit Number [id number]    Blood Component Type THAWED PLASMA    Unit division 00    Status of Unit ISSUED,FINAL    Transfusion Status OK TO TRANSFUSE   I-Stat arterial blood gas, ED     Status: Abnormal   Collection Time: 06/20/2014  9:19 PM  Result Value Ref Range   pH, Arterial 6.989 (LL) 7.350 - 7.450   pCO2 arterial 18.5 (LL) 35.0 - 45.0 mmHg   pO2, Arterial 160.0 (H) 80.0 - 100.0 mmHg   Bicarbonate 4.5 (L) 20.0 - 24.0 mEq/L   TCO2 5 0 - 100 mmol/L   O2 Saturation 98.0 %   Acid-base deficit 25.0 (H) 0.0 - 2.0 mmol/L   Patient temperature 98.6 F    Collection site RADIAL, ALLEN'S TEST ACCEPTABLE    Drawn by RT    Sample type ARTERIAL    Comment NOTIFIED PHYSICIAN   Urinalysis, Routine w reflex microscopic     Status:  Abnormal   Collection Time: 06/29/2014 10:02 PM  Result Value Ref Range   Color, Urine ORANGE (A) YELLOW    Comment: BIOCHEMICALS MAY BE AFFECTED BY COLOR   APPearance CLEAR CLEAR   Specific Gravity, Urine 1.029 1.005 - 1.030   pH 5.0 5.0 - 8.0   Glucose, UA NEGATIVE NEGATIVE mg/dL   Hgb urine dipstick LARGE (A) NEGATIVE   Bilirubin Urine SMALL (A) NEGATIVE   Ketones, ur 15 (A) NEGATIVE mg/dL   Protein, ur 30 (A) NEGATIVE mg/dL   Urobilinogen, UA 1.0 0.0 - 1.0 mg/dL   Nitrite NEGATIVE NEGATIVE   Leukocytes, UA TRACE (A) NEGATIVE  Strep pneumoniae urinary antigen     Status: None   Collection Time: 06/20/2014 10:02 PM  Result Value Ref Range   Strep Pneumo Urinary Antigen NEGATIVE NEGATIVE    Comment:        Infection due to S. pneumoniae cannot be absolutely ruled out since the antigen present may be below the detection limit of the test.   Urine microscopic-add on     Status: Abnormal   Collection Time: 06/20/2014 10:02 PM  Result Value Ref Range   Squamous Epithelial / LPF FEW (A) RARE   WBC, UA 0-2 <3 WBC/hpf   RBC / HPF 0-2 <3 RBC/hpf   Bacteria, UA FEW (A) RARE   Casts HYALINE CASTS (A) NEGATIVE   Urine-Other MUCOUS PRESENT   Lipase, blood     Status: Abnormal   Collection Time: 06/24/2014 10:22 PM  Result Value Ref Range   Lipase  359 (H) 11 - 59 U/L  Lipid panel     Status: Abnormal   Collection Time: 07/03/2014 10:22 PM  Result Value Ref Range   Cholesterol 59 0 - 200 mg/dL   Triglycerides 126 <150 mg/dL   HDL 5 (L) >39 mg/dL   Total CHOL/HDL Ratio 11.8 RATIO   VLDL 25 0 - 40 mg/dL   LDL Cholesterol 29 0 - 99 mg/dL    Comment:        Total Cholesterol/HDL:CHD Risk Coronary Heart Disease Risk Table                     Men   Women  1/2 Average Risk   3.4   3.3  Average Risk       5.0   4.4  2 X Average Risk   9.6   7.1  3 X Average Risk  23.4   11.0        Use the calculated Patient Ratio above and the CHD Risk Table to determine the patient's CHD Risk.         ATP III CLASSIFICATION (LDL):  <100     mg/dL   Optimal  100-129  mg/dL   Near or Above                    Optimal  130-159  mg/dL   Borderline  160-189  mg/dL   High  >190     mg/dL   Very High    CK total and CKMB (cardiac)     Status: Abnormal   Collection Time: 07/07/2014 10:22 PM  Result Value Ref Range   Total CK 359 (H) 7 - 232 U/L   CK, MB 14.5 (HH) 0.3 - 4.0 ng/mL    Comment: REPEATED TO VERIFY CRITICAL RESULT CALLED TO, READ BACK BY AND VERIFIED WITH: HARDUK G,RN 06/28/2014 2339 WAYK    Relative Index 4.0 (H) 0.0 - 2.5  Amylase     Status: Abnormal   Collection Time: 07/04/2014 10:22 PM  Result Value Ref Range   Amylase 723 (H) 0 - 105 U/L  Comprehensive metabolic panel     Status: Abnormal   Collection Time: 07/05/2014 10:22 PM  Result Value Ref Range   Sodium 141 135 - 145 mmol/L   Potassium 5.4 (H) 3.5 - 5.1 mmol/L   Chloride 103 96 - 112 mmol/L   CO2 15 (L) 19 - 32 mmol/L   Glucose, Bld 415 (H) 70 - 99 mg/dL   BUN 39 (H) 6 - 23 mg/dL   Creatinine, Ser 3.16 (H) 0.50 - 1.35 mg/dL   Calcium 6.0 (LL) 8.4 - 10.5 mg/dL    Comment: REPEATED TO VERIFY CRITICAL RESULT CALLED TO, READ BACK BY AND VERIFIED WITH: HARDUK G,RN 06/20/2014 2339 WAYK    Total Protein 3.4 (L) 6.0 - 8.3 g/dL   Albumin 1.8 (L) 3.5 - 5.2 g/dL   AST 175 (H) 0 - 37 U/L   ALT 163 (H) 0 - 53 U/L   Alkaline Phosphatase 70 39 - 117 U/L   Total Bilirubin 3.6 (H) 0.3 - 1.2 mg/dL   GFR calc non Af Amer 19 (L) >90 mL/min   GFR calc Af Amer 22 (L) >90 mL/min    Comment: (NOTE) The eGFR has been calculated using the CKD EPI equation. This calculation has not been validated in all clinical situations. eGFR's persistently <90 mL/min signify possible Chronic Kidney Disease.    Anion gap 23 (  H) 5 - 15  Magnesium     Status: None   Collection Time: 06/25/2014 10:22 PM  Result Value Ref Range   Magnesium 1.5 1.5 - 2.5 mg/dL  Phosphorus     Status: Abnormal   Collection Time: 07/13/2014 10:22 PM  Result Value Ref  Range   Phosphorus 7.0 (H) 2.3 - 4.6 mg/dL  Troponin I     Status: Abnormal   Collection Time: 06/24/2014 10:22 PM  Result Value Ref Range   Troponin I 0.05 (H) <0.031 ng/mL    Comment:        PERSISTENTLY INCREASED TROPONIN VALUES IN THE RANGE OF 0.04-0.49 ng/mL CAN BE SEEN IN:       -UNSTABLE ANGINA       -CONGESTIVE HEART FAILURE       -MYOCARDITIS       -CHEST TRAUMA       -ARRYHTHMIAS       -LATE PRESENTING MYOCARDIAL INFARCTION       -COPD   CLINICAL FOLLOW-UP RECOMMENDED.   Lactic acid, plasma     Status: Abnormal   Collection Time: 06/16/2014 10:22 PM  Result Value Ref Range   Lactic Acid, Venous 16.8 (HH) 0.5 - 2.0 mmol/L    Comment: RESULTS CONFIRMED BY MANUAL DILUTION CRITICAL RESULT CALLED TO, READ BACK BY AND VERIFIED WITH: HARDUK G,RN 06/18/2014 2340 WAYK REPEATED TO VERIFY   Procalcitonin     Status: None   Collection Time: 07/07/2014 10:22 PM  Result Value Ref Range   Procalcitonin 4.67 ng/mL    Comment:        Interpretation: PCT > 2 ng/mL: Systemic infection (sepsis) is likely, unless other causes are known. (NOTE)         ICU PCT Algorithm               Non ICU PCT Algorithm    ----------------------------     ------------------------------         PCT < 0.25 ng/mL                 PCT < 0.1 ng/mL     Stopping of antibiotics            Stopping of antibiotics       strongly encouraged.               strongly encouraged.    ----------------------------     ------------------------------       PCT level decrease by               PCT < 0.25 ng/mL       >= 80% from peak PCT       OR PCT 0.25 - 0.5 ng/mL          Stopping of antibiotics                                             encouraged.     Stopping of antibiotics           encouraged.    ----------------------------     ------------------------------       PCT level decrease by              PCT >= 0.25 ng/mL       < 80% from peak PCT        AND PCT >= 0.5 ng/mL  Continuing antibiotics                                                encouraged.       Continuing antibiotics            encouraged.    ----------------------------     ------------------------------     PCT level increase compared          PCT > 0.5 ng/mL         with peak PCT AND          PCT >= 0.5 ng/mL             Escalation of antibiotics                                          strongly encouraged.      Escalation of antibiotics        strongly encouraged.   Brain natriuretic peptide     Status: Abnormal   Collection Time: 07/11/2014 10:22 PM  Result Value Ref Range   B Natriuretic Peptide 103.8 (H) 0.0 - 100.0 pg/mL  Cortisol     Status: None   Collection Time: 06/18/2014 10:22 PM  Result Value Ref Range   Cortisol, Plasma 129.8 ug/dL    Comment: (NOTE) Result confirmed by automatic dilution. AM:  4.3 - 22.4 ug/dL PM:  3.1 - 16.7 ug/dL Performed at [facility]   CBC WITH DIFFERENTIAL     Status: Abnormal   Collection Time: 06/19/2014 10:22 PM  Result Value Ref Range   WBC 17.4 (H) 4.0 - 10.5 K/uL   RBC 3.37 (L) 4.22 - 5.81 MIL/uL   Hemoglobin 11.2 (L) 13.0 - 17.0 g/dL    Comment: DELTA CHECK NOTED REPEATED TO VERIFY    HCT 33.1 (L) 39.0 - 52.0 %   MCV 98.2 78.0 - 100.0 fL   MCH 33.2 26.0 - 34.0 pg   MCHC 33.8 30.0 - 36.0 g/dL   RDW 16.7 (H) 11.5 - 15.5 %   Platelets 74 (L) 150 - 400 K/uL    Comment: DELTA CHECK NOTED REPEATED TO VERIFY    Neutrophils Relative % 86 (H) 43 - 77 %   Neutro Abs 15.0 (H) 1.7 - 7.7 K/uL   Lymphocytes Relative 9 (L) 12 - 46 %   Lymphs Abs 1.5 0.7 - 4.0 K/uL   Monocytes Relative 5 3 - 12 %   Monocytes Absolute 0.8 0.1 - 1.0 K/uL   Eosinophils Relative 0 0 - 5 %   Eosinophils Absolute 0.0 0.0 - 0.7 K/uL   Basophils Relative 0 0 - 1 %   Basophils Absolute 0.0 0.0 - 0.1 K/uL  Protime-INR     Status: Abnormal   Collection Time: 07/13/2014 10:22 PM  Result Value Ref Range   Prothrombin Time 27.8 (H) 11.6 - 15.2 seconds   INR 2.57 (H) 0.00 - 1.49  APTT      Status: Abnormal   Collection Time: 07/04/2014 10:22 PM  Result Value Ref Range   aPTT 66 (H) 24 - 37 seconds    Comment:        IF BASELINE aPTT IS ELEVATED, SUGGEST PATIENT RISK ASSESSMENT BE USED TO DETERMINE APPROPRIATE ANTICOAGULANT THERAPY.  Glucose, capillary     Status: Abnormal   Collection Time: 07/05/2014 11:26 PM  Result Value Ref Range   Glucose-Capillary 122 (H) 70 - 99 mg/dL   Comment 1 Notify RN   MRSA PCR Screening     Status: None   Collection Time: 07/02/2014 11:55 PM  Result Value Ref Range   MRSA by PCR NEGATIVE NEGATIVE    Comment:        The GeneXpert MRSA Assay (FDA approved for NASAL specimens only), is one component of a comprehensive MRSA colonization surveillance program. It is not intended to diagnose MRSA infection nor to guide or monitor treatment for MRSA infections.   Urinalysis, Routine w reflex microscopic     Status: Abnormal   Collection Time: 06/20/2014 11:56 PM  Result Value Ref Range   Color, Urine AMBER (A) YELLOW    Comment: BIOCHEMICALS MAY BE AFFECTED BY COLOR   APPearance CLOUDY (A) CLEAR   Specific Gravity, Urine 1.039 (H) 1.005 - 1.030   pH 5.0 5.0 - 8.0   Glucose, UA NEGATIVE NEGATIVE mg/dL   Hgb urine dipstick LARGE (A) NEGATIVE   Bilirubin Urine SMALL (A) NEGATIVE   Ketones, ur 15 (A) NEGATIVE mg/dL   Protein, ur 30 (A) NEGATIVE mg/dL   Urobilinogen, UA 1.0 0.0 - 1.0 mg/dL   Nitrite NEGATIVE NEGATIVE   Leukocytes, UA NEGATIVE NEGATIVE  Urine microscopic-add on     Status: Abnormal   Collection Time: 07/08/2014 11:56 PM  Result Value Ref Range   Squamous Epithelial / LPF FEW (A) RARE   WBC, UA 0-2 <3 WBC/hpf   RBC / HPF 3-6 <3 RBC/hpf   Bacteria, UA FEW (A) RARE   Casts GRANULAR CAST (A) NEGATIVE    Comment: HYALINE CASTS   Urine-Other AMORPHOUS URATES/PHOSPHATES   Carboxyhemoglobin     Status: Abnormal   Collection Time: 07/05/2014  3:09 AM  Result Value Ref Range   Total hemoglobin 11.1 (L) 13.5 - 18.0 g/dL   O2  Saturation 59.6 %   Carboxyhemoglobin 0.2 (L) 0.5 - 1.5 %   Methemoglobin 1.6 (H) 0.0 - 1.5 %  Troponin I     Status: Abnormal   Collection Time: 07/04/2014  3:10 AM  Result Value Ref Range   Troponin I 0.05 (H) <0.031 ng/mL    Comment:        PERSISTENTLY INCREASED TROPONIN VALUES IN THE RANGE OF 0.04-0.49 ng/mL CAN BE SEEN IN:       -UNSTABLE ANGINA       -CONGESTIVE HEART FAILURE       -MYOCARDITIS       -CHEST TRAUMA       -ARRYHTHMIAS       -LATE PRESENTING MYOCARDIAL INFARCTION       -COPD   CLINICAL FOLLOW-UP RECOMMENDED.   CBC     Status: Abnormal   Collection Time: 06/18/2014  3:10 AM  Result Value Ref Range   WBC 12.3 (H) 4.0 - 10.5 K/uL   RBC 3.33 (L) 4.22 - 5.81 MIL/uL   Hemoglobin 10.9 (L) 13.0 - 17.0 g/dL   HCT 32.8 (L) 39.0 - 52.0 %   MCV 98.5 78.0 - 100.0 fL   MCH 32.7 26.0 - 34.0 pg   MCHC 33.2 30.0 - 36.0 g/dL   RDW 16.9 (H) 11.5 - 15.5 %   Platelets 61 (L) 150 - 400 K/uL    Comment: CONSISTENT WITH PREVIOUS RESULT  Basic metabolic panel     Status: Abnormal  Collection Time: 07/05/2014  3:10 AM  Result Value Ref Range   Sodium 139 135 - 145 mmol/L   Potassium 5.8 (H) 3.5 - 5.1 mmol/L   Chloride 107 96 - 112 mmol/L   CO2 11 (L) 19 - 32 mmol/L   Glucose, Bld 154 (H) 70 - 99 mg/dL   BUN 42 (H) 6 - 23 mg/dL   Creatinine, Ser 3.42 (H) 0.50 - 1.35 mg/dL   Calcium 6.2 (LL) 8.4 - 10.5 mg/dL    Comment: REPEATED TO VERIFY CRITICAL RESULT CALLED TO, READ BACK BY AND VERIFIED WITH: SHEPHERD B,RN 07/07/2014 0403 WAYK    GFR calc non Af Amer 17 (L) >90 mL/min   GFR calc Af Amer 20 (L) >90 mL/min    Comment: (NOTE) The eGFR has been calculated using the CKD EPI equation. This calculation has not been validated in all clinical situations. eGFR's persistently <90 mL/min signify possible Chronic Kidney Disease.    Anion gap 21 (H) 5 - 15  Magnesium     Status: None   Collection Time: 07/12/2014  3:10 AM  Result Value Ref Range   Magnesium 1.6 1.5 - 2.5 mg/dL   Phosphorus     Status: Abnormal   Collection Time: 06/16/2014  3:10 AM  Result Value Ref Range   Phosphorus 6.9 (H) 2.3 - 4.6 mg/dL  Hepatic function panel     Status: Abnormal   Collection Time: 06/18/2014  3:10 AM  Result Value Ref Range   Total Protein 4.0 (L) 6.0 - 8.3 g/dL   Albumin 2.2 (L) 3.5 - 5.2 g/dL   AST 182 (H) 0 - 37 U/L   ALT 154 (H) 0 - 53 U/L   Alkaline Phosphatase 69 39 - 117 U/L   Total Bilirubin 3.9 (H) 0.3 - 1.2 mg/dL   Bilirubin, Direct 2.9 (H) 0.0 - 0.5 mg/dL   Indirect Bilirubin 1.0 (H) 0.3 - 0.9 mg/dL  Blood gas, arterial     Status: Abnormal   Collection Time: 06/18/2014  3:51 AM  Result Value Ref Range   FIO2 0.40 %   Delivery systems VENTILATOR    Mode PRESSURE REGULATED VOLUME CONTROL    VT 650 mL   Rate 35 resp/min   Peep/cpap 5.0 cm H20   pH, Arterial 7.244 (L) 7.350 - 7.450   pCO2 arterial 18.7 (LL) 35.0 - 45.0 mmHg    Comment: CRITICAL RESULT CALLED TO, READ BACK BY AND VERIFIED WITH: FRANK MIKE,RRT AT 400, BY TRACEY ELLIS RRT ON 07/05/2014    pO2, Arterial 155.0 (H) 80.0 - 100.0 mmHg   Bicarbonate 7.8 (L) 20.0 - 24.0 mEq/L   TCO2 8.4 0 - 100 mmol/L   Acid-base deficit 18.3 (H) 0.0 - 2.0 mmol/L   O2 Saturation 98.0 %   Patient temperature 98.6    Collection site RIGHT RADIAL    Drawn by [id number]    Sample type ARTERIAL    Allens test (pass/fail) PASS PASS  Troponin I     Status: Abnormal   Collection Time: 06/18/2014  6:30 AM  Result Value Ref Range   Troponin I 0.05 (H) <0.031 ng/mL    Comment:        PERSISTENTLY INCREASED TROPONIN VALUES IN THE RANGE OF 0.04-0.49 ng/mL CAN BE SEEN IN:       -UNSTABLE ANGINA       -CONGESTIVE HEART FAILURE       -MYOCARDITIS       -CHEST TRAUMA       -  ARRYHTHMIAS       -LATE PRESENTING MYOCARDIAL INFARCTION       -COPD   CLINICAL FOLLOW-UP RECOMMENDED.   Lactic acid, plasma     Status: Abnormal   Collection Time: 07/10/2014 10:00 AM  Result Value Ref Range   Lactic Acid, Venous 15.9 (HH) 0.5 - 2.0 mmol/L     Comment: REPEATED TO VERIFY CRITICAL RESULT CALLED TO, READ BACK BY AND VERIFIED WITH: RESULTS CONFIRMED BY MANUAL DILUTION J.HOLLAND,RN 07/01/2014 @1220  BY V.WILKINS   Lipase, blood     Status: Abnormal   Collection Time: 06/17/2014 10:00 AM  Result Value Ref Range   Lipase 265 (H) 11 - 59 U/L  Amylase     Status: Abnormal   Collection Time: 07/11/2014 10:00 AM  Result Value Ref Range   Amylase 648 (H) 0 - 105 U/L  Protime-INR     Status: Abnormal   Collection Time: 06/27/2014 10:00 AM  Result Value Ref Range   Prothrombin Time 23.8 (H) 11.6 - 15.2 seconds   INR 2.11 (H) 0.00 - 1.49  DIC (disseminated intravasc coag) panel     Status: Abnormal   Collection Time: 06/29/2014 10:00 AM  Result Value Ref Range   Prothrombin Time 23.8 (H) 11.6 - 15.2 seconds   INR 2.11 (H) 0.00 - 1.49   aPTT 52 (H) 24 - 37 seconds    Comment:        IF BASELINE aPTT IS ELEVATED, SUGGEST PATIENT RISK ASSESSMENT BE USED TO DETERMINE APPROPRIATE ANTICOAGULANT THERAPY.    Fibrinogen 82 (LL) 204 - 475 mg/dL    Comment: REPEATED TO VERIFY CRITICAL RESULT CALLED TO, READ BACK BY AND VERIFIED WITH: J.HOLLAND RN [id number] 07/04/2014 E.[email or web address]    D-Dimer, Quant 19.90 (H) 0.00 - 0.48 ug/mL-FEU    Comment:        AT THE INHOUSE ESTABLISHED CUTOFF VALUE OF 0.48 ug/mL FEU, THIS ASSAY HAS BEEN DOCUMENTED IN THE LITERATURE TO HAVE A SENSITIVITY AND NEGATIVE PREDICTIVE VALUE OF AT LEAST 98 TO 99%.  THE TEST RESULT SHOULD BE CORRELATED WITH AN ASSESSMENT OF THE CLINICAL PROBABILITY OF DVT / VTE.    Platelets 56 (L) 150 - 400 K/uL    Comment: CONSISTENT WITH PREVIOUS RESULT   Smear Review NO SCHISTOCYTES SEEN   Procalcitonin - Baseline     Status: None   Collection Time: 06/23/2014 10:00 AM  Result Value Ref Range   Procalcitonin 8.35 ng/mL    Comment:        Interpretation: PCT > 2 ng/mL: Systemic infection (sepsis) is likely, unless other causes are known. (NOTE)         ICU PCT Algorithm               Non ICU  PCT Algorithm    ----------------------------     ------------------------------         PCT < 0.25 ng/mL                 PCT < 0.1 ng/mL     Stopping of antibiotics            Stopping of antibiotics       strongly encouraged.               strongly encouraged.    ----------------------------     ------------------------------       PCT level decrease by               PCT < 0.25 ng/mL       >=  80% from peak PCT       OR PCT 0.25 - 0.5 ng/mL          Stopping of antibiotics                                             encouraged.     Stopping of antibiotics           encouraged.    ----------------------------     ------------------------------       PCT level decrease by              PCT >= 0.25 ng/mL       < 80% from peak PCT        AND PCT >= 0.5 ng/mL            Continuing antibiotics                                               encouraged.       Continuing antibiotics            encouraged.    ----------------------------     ------------------------------     PCT level increase compared          PCT > 0.5 ng/mL         with peak PCT AND          PCT >= 0.5 ng/mL             Escalation of antibiotics                                          strongly encouraged.      Escalation of antibiotics        strongly encouraged.   Cortisol     Status: None   Collection Time: 07/11/2014 10:00 AM  Result Value Ref Range   Cortisol, Plasma 98.6 ug/dL    Comment: (NOTE) Result confirmed by automatic dilution. AM:  4.3 - 22.4 ug/dL PM:  3.1 - 16.7 ug/dL Performed at [facility] metabolic panel     Status: Abnormal   Collection Time: 07/01/2014 10:00 AM  Result Value Ref Range   Sodium 140 135 - 145 mmol/L   Potassium 5.7 (H) 3.5 - 5.1 mmol/L   Chloride 109 96 - 112 mmol/L   CO2 14 (L) 19 - 32 mmol/L   Glucose, Bld 104 (H) 70 - 99 mg/dL   BUN 43 (H) 6 - 23 mg/dL   Creatinine, Ser 3.61 (H) 0.50 - 1.35 mg/dL   Calcium 5.6 (LL) 8.4 - 10.5 mg/dL    Comment: REPEATED TO  VERIFY CRITICAL RESULT CALLED TO, READ BACK BY AND VERIFIED WITH: HOLLANDJRN [id number] MCCAULEG    GFR calc non Af Amer 16 (L) >90 mL/min   GFR calc Af Amer 19 (L) >90 mL/min    Comment: (NOTE) The eGFR has been calculated using the CKD EPI equation. This calculation has not been validated in all clinical situations. eGFR's persistently <90 mL/min signify possible Chronic Kidney Disease.    Anion gap 17 (H) 5 - 15  CBC  Status: Abnormal   Collection Time: 06/18/2014 10:00 AM  Result Value Ref Range   WBC 10.0 4.0 - 10.5 K/uL   RBC 3.41 (L) 4.22 - 5.81 MIL/uL   Hemoglobin 11.2 (L) 13.0 - 17.0 g/dL   HCT 32.4 (L) 39.0 - 52.0 %   MCV 95.0 78.0 - 100.0 fL   MCH 32.8 26.0 - 34.0 pg   MCHC 34.6 30.0 - 36.0 g/dL   RDW 16.6 (H) 11.5 - 15.5 %   Platelets 54 (L) 150 - 400 K/uL    Comment: CONSISTENT WITH PREVIOUS RESULT  I-STAT 3, arterial blood gas (G3+)     Status: Abnormal   Collection Time: 07/07/2014 11:25 AM  Result Value Ref Range   pH, Arterial 7.346 (L) 7.350 - 7.450   pCO2 arterial 19.3 (LL) 35.0 - 45.0 mmHg   pO2, Arterial 153.0 (H) 80.0 - 100.0 mmHg   Bicarbonate 10.5 (L) 20.0 - 24.0 mEq/L   TCO2 11 0 - 100 mmol/L   O2 Saturation 99.0 %   Acid-base deficit 13.0 (H) 0.0 - 2.0 mmol/L   Patient temperature 100.3 F    Collection site RADIAL, ALLEN'S TEST ACCEPTABLE    Drawn by RT    Sample type ARTERIAL    Comment NOTIFIED PHYSICIAN   Prepare fresh frozen plasma     Status: None   Collection Time: 06/23/2014 11:43 AM  Result Value Ref Range   Unit Number [id number]    Blood Component Type THAWED PLASMA    Unit division 00    Status of Unit ISSUED,FINAL    Transfusion Status OK TO TRANSFUSE    Unit Number [id number]    Blood Component Type THAWED PLASMA    Unit division 00    Status of Unit ISSUED,FINAL    Transfusion Status OK TO TRANSFUSE    Unit Number [id number]    Blood Component Type THW PLS APHR    Unit division A0    Status of Unit REL FROM [facility]     Transfusion Status OK TO TRANSFUSE    Unit Number [id number]    Blood Component Type THW PLS APHR    Unit division A0    Status of Unit REL FROM [facility]    Transfusion Status OK TO TRANSFUSE   Sodium, urine, random     Status: None   Collection Time: 07/06/2014 12:10 PM  Result Value Ref Range   Sodium, Ur 924 mmol/L    Comment: REPEATED TO VERIFY RESULTS CONFIRMED BY MANUAL DILUTION   Creatinine, urine, random     Status: None   Collection Time: 06/23/2014 12:10 PM  Result Value Ref Range   Creatinine, Urine 190.56 mg/dL  Prepare RBC (crossmatch)     Status: None   Collection Time: 07/06/2014 12:20 PM  Result Value Ref Range   Order Confirmation ORDER PROCESSED BY BLOOD BANK   Prepare cryoprecipitate     Status: None   Collection Time: 06/24/2014 12:59 PM  Result Value Ref Range   Unit Number [id number]    Blood Component Type CRYPOOL THAW    Unit division 00    Status of Unit ISSUED,FINAL    Transfusion Status OK TO TRANSFUSE   Lactic acid, plasma     Status: Abnormal   Collection Time: 07/04/2014  1:07 PM  Result Value Ref Range   Lactic Acid, Venous 13.4 (HH) 0.5 - 2.0 mmol/L    Comment: RESULTS CONFIRMED BY MANUAL DILUTION REPEATED TO VERIFY CRITICAL RESULT CALLED TO, READ BACK BY  AND VERIFIED WITH: J.HOLLAND RN [phone] 06/18/2014 E.GADDY   Basic metabolic panel     Status: Abnormal   Collection Time: 06/20/2014  2:00 PM  Result Value Ref Range   Sodium 142 135 - 145 mmol/L   Potassium 5.1 3.5 - 5.1 mmol/L   Chloride 109 96 - 112 mmol/L   CO2 12 (L) 19 - 32 mmol/L   Glucose, Bld 101 (H) 70 - 99 mg/dL   BUN 42 (H) 6 - 23 mg/dL   Creatinine, Ser 3.27 (H) 0.50 - 1.35 mg/dL   Calcium 5.5 (LL) 8.4 - 10.5 mg/dL    Comment: REPEATED TO VERIFY CRITICAL RESULT CALLED TO, READ BACK BY AND VERIFIED WITH: T.CRITE RN [id number] 07/03/2014 E.GADDY    GFR calc non Af Amer 18 (L) >90 mL/min   GFR calc Af Amer 21 (L) >90 mL/min    Comment: (NOTE) The eGFR has been calculated using the CKD EPI  equation. This calculation has not been validated in all clinical situations. eGFR's persistently <90 mL/min signify possible Chronic Kidney Disease.    Anion gap 21 (H) 5 - 15  CBC     Status: Abnormal   Collection Time: 07/12/2014  2:00 PM  Result Value Ref Range   WBC 6.3 4.0 - 10.5 K/uL   RBC 3.19 (L) 4.22 - 5.81 MIL/uL   Hemoglobin 10.5 (L) 13.0 - 17.0 g/dL   HCT 31.0 (L) 39.0 - 52.0 %   MCV 97.2 78.0 - 100.0 fL   MCH 32.9 26.0 - 34.0 pg   MCHC 33.9 30.0 - 36.0 g/dL   RDW 16.3 (H) 11.5 - 15.5 %   Platelets 46 (L) 150 - 400 K/uL    Comment: CONSISTENT WITH PREVIOUS RESULT  Prepare fresh frozen plasma     Status: None   Collection Time: 06/28/2014  3:02 PM  Result Value Ref Range   Unit Number [id number]    Blood Component Type THW PLS APHR    Unit division A0    Status of Unit REL FROM [facility]    Transfusion Status OK TO TRANSFUSE    Unit Number [id number]    Blood Component Type THW PLS APHR    Unit division A0    Status of Unit REL FROM [facility]    Transfusion Status OK TO TRANSFUSE   I-STAT 3, arterial blood gas (G3+)     Status: Abnormal   Collection Time: 07/11/2014  3:24 PM  Result Value Ref Range   pH, Arterial 7.342 (L) 7.350 - 7.450   pCO2 arterial 17.9 (LL) 35.0 - 45.0 mmHg   pO2, Arterial 85.0 80.0 - 100.0 mmHg   Bicarbonate 9.7 (L) 20.0 - 24.0 mEq/L   TCO2 10 0 - 100 mmol/L   O2 Saturation 96.0 %   Acid-base deficit 14.0 (H) 0.0 - 2.0 mmol/L   Patient temperature 98.3 F    Collection site ARTERIAL LINE    Drawn by Nurse    Sample type ARTERIAL    Comment NOTIFIED PHYSICIAN   Lactic acid, plasma     Status: Abnormal   Collection Time: 06/29/2014  5:07 PM  Result Value Ref Range   Lactic Acid, Venous 17.4 (HH) 0.5 - 2.0 mmol/L    Comment: REPEATED TO VERIFY CRITICAL RESULT CALLED TO, READ BACK BY AND VERIFIED WITH: S.WHITE,RN 07/07/2014 @2022  BY V.WILKINS   I-STAT 3, arterial blood gas (G3+)     Status: Abnormal   Collection Time: 07/03/2014  8:40 PM   Result Value Ref  Range   pH, Arterial 7.310 (L) 7.350 - 7.450   pCO2 arterial 19.4 (LL) 35.0 - 45.0 mmHg   pO2, Arterial 143.0 (H) 80.0 - 100.0 mmHg   Bicarbonate 9.7 (L) 20.0 - 24.0 mEq/L   TCO2 10 0 - 100 mmol/L   O2 Saturation 99.0 %   Acid-base deficit 14.0 (H) 0.0 - 2.0 mmol/L   Patient temperature 98.9 F    Collection site RADIAL, ALLEN'S TEST ACCEPTABLE    Sample type ARTERIAL    Comment NOTIFIED PHYSICIAN   Albumin     Status: Abnormal   Collection Time: 07/12/2014  8:55 PM  Result Value Ref Range   Albumin 1.9 (L) 3.5 - 5.2 g/dL  Basic metabolic panel     Status: Abnormal   Collection Time: 07/06/2014  9:10 PM  Result Value Ref Range   Sodium 141 135 - 145 mmol/L   Potassium 4.8 3.5 - 5.1 mmol/L   Chloride 109 96 - 112 mmol/L   CO2 10 (LL) 19 - 32 mmol/L    Comment: REPEATED TO VERIFY CRITICAL RESULT CALLED TO, READ BACK BY AND VERIFIED WITH: S. WHITE RN [phone] GREEN R    Glucose, Bld 107 (H) 70 - 99 mg/dL   BUN 44 (H) 6 - 23 mg/dL   Creatinine, Ser 3.51 (H) 0.50 - 1.35 mg/dL   Calcium 5.3 (LL) 8.4 - 10.5 mg/dL    Comment: REPEATED TO VERIFY CRITICAL RESULT CALLED TO, READ BACK BY AND VERIFIED WITH: S. WHITE RN [phone] GREEN R    GFR calc non Af Amer 17 (L) >90 mL/min   GFR calc Af Amer 20 (L) >90 mL/min    Comment: (NOTE) The eGFR has been calculated using the CKD EPI equation. This calculation has not been validated in all clinical situations. eGFR's persistently <90 mL/min signify possible Chronic Kidney Disease.    Anion gap 22 (H) 5 - 15  Protime-INR     Status: Abnormal   Collection Time: 06/23/14  5:00 AM  Result Value Ref Range   Prothrombin Time 22.1 (H) 11.6 - 15.2 seconds   INR 1.91 (H) 0.00 - 1.49  CBC with Differential/Platelet     Status: Abnormal   Collection Time: 06/23/14  5:00 AM  Result Value Ref Range   WBC 14.1 (H) 4.0 - 10.5 K/uL   RBC 3.30 (L) 4.22 - 5.81 MIL/uL   Hemoglobin 10.7 (L) 13.0 - 17.0 g/dL   HCT 31.1 (L) 39.0 -  52.0 %   MCV 94.2 78.0 - 100.0 fL   MCH 32.4 26.0 - 34.0 pg   MCHC 34.4 30.0 - 36.0 g/dL   RDW 16.8 (H) 11.5 - 15.5 %   Platelets 62 (L) 150 - 400 K/uL    Comment: REPEATED TO VERIFY CONSISTENT WITH PREVIOUS RESULT    Neutrophils Relative % 83 (H) 43 - 77 %   Lymphocytes Relative 5 (L) 12 - 46 %   Monocytes Relative 12 3 - 12 %   Eosinophils Relative 0 0 - 5 %   Basophils Relative 0 0 - 1 %   Neutro Abs 11.7 (H) 1.7 - 7.7 K/uL   Lymphs Abs 0.7 0.7 - 4.0 K/uL   Monocytes Absolute 1.7 (H) 0.1 - 1.0 K/uL   Eosinophils Absolute 0.0 0.0 - 0.7 K/uL   Basophils Absolute 0.0 0.0 - 0.1 K/uL   RBC Morphology BURR CELLS     Comment: POLYCHROMASIA PRESENT   WBC Morphology TOXIC GRANULATION  Comment: INCREASED BANDS (>20% BANDS) MODERATE LEFT SHIFT (>5% METAS AND MYELOS,OCC PRO NOTED) DOHLE BODIES   Magnesium     Status: None   Collection Time: 06/23/14  5:00 AM  Result Value Ref Range   Magnesium 1.5 1.5 - 2.5 mg/dL  Procalcitonin     Status: None   Collection Time: 06/23/14  5:00 AM  Result Value Ref Range   Procalcitonin 7.79 ng/mL    Comment:        Interpretation: PCT > 2 ng/mL: Systemic infection (sepsis) is likely, unless other causes are known. (NOTE)         ICU PCT Algorithm               Non ICU PCT Algorithm    ----------------------------     ------------------------------         PCT < 0.25 ng/mL                 PCT < 0.1 ng/mL     Stopping of antibiotics            Stopping of antibiotics       strongly encouraged.               strongly encouraged.    ----------------------------     ------------------------------       PCT level decrease by               PCT < 0.25 ng/mL       >= 80% from peak PCT       OR PCT 0.25 - 0.5 ng/mL          Stopping of antibiotics                                             encouraged.     Stopping of antibiotics           encouraged.    ----------------------------     ------------------------------       PCT level decrease by               PCT >= 0.25 ng/mL       < 80% from peak PCT        AND PCT >= 0.5 ng/mL            Continuing antibiotics                                               encouraged.       Continuing antibiotics            encouraged.    ----------------------------     ------------------------------     PCT level increase compared          PCT > 0.5 ng/mL         with peak PCT AND          PCT >= 0.5 ng/mL             Escalation of antibiotics  strongly encouraged.      Escalation of antibiotics        strongly encouraged.   Renal function panel     Status: Abnormal   Collection Time: 06/23/14  5:00 AM  Result Value Ref Range   Sodium 141 135 - 145 mmol/L   Potassium 4.7 3.5 - 5.1 mmol/L   Chloride 103 96 - 112 mmol/L   CO2 12 (L) 19 - 32 mmol/L   Glucose, Bld 98 70 - 99 mg/dL   BUN 47 (H) 6 - 23 mg/dL   Creatinine, Ser 3.98 (H) 0.50 - 1.35 mg/dL   Calcium 5.3 (LL) 8.4 - 10.5 mg/dL    Comment: REPEATED TO VERIFY CRITICAL RESULT CALLED TO, READ BACK BY AND VERIFIED WITH: S. WHITE RN [phone] GREEN R    Phosphorus 5.6 (H) 2.3 - 4.6 mg/dL   Albumin 1.8 (L) 3.5 - 5.2 g/dL   GFR calc non Af Amer 14 (L) >90 mL/min   GFR calc Af Amer 17 (L) >90 mL/min    Comment: (NOTE) The eGFR has been calculated using the CKD EPI equation. This calculation has not been validated in all clinical situations. eGFR's persistently <90 mL/min signify possible Chronic Kidney Disease.    Anion gap 26 (H) 5 - 15  Lactic acid, plasma     Status: Abnormal   Collection Time: 06/23/14  5:00 AM  Result Value Ref Range   Lactic Acid, Venous 17.1 (HH) 0.5 - 2.0 mmol/L    Comment: CRITICAL RESULT CALLED TO, READ BACK BY AND VERIFIED WITH: SORCHA C RN 06/23/14 0744 COSTELLO B RESULTS CONFIRMED BY MANUAL DILUTION   Blood gas, arterial     Status: Abnormal   Collection Time: 06/23/14  5:21 AM  Result Value Ref Range   FIO2 0.40 %   Delivery systems VENTILATOR    Mode  PRESSURE REGULATED VOLUME CONTROL    VT 650 mL   Rate 35 resp/min   Peep/cpap 5.0 cm H20   pH, Arterial 7.377 7.350 - 7.450   pCO2 arterial 19.7 (LL) 35.0 - 45.0 mmHg    Comment: CRITICAL RESULT CALLED TO, READ BACK BY AND VERIFIED WITH:  Troy Sine, RRT AT 0528, BY SHANNON CARTER RRT, RCP ON 06/23/2014    pO2, Arterial 104.0 (H) 80.0 - 100.0 mmHg   Bicarbonate 11.2 (L) 20.0 - 24.0 mEq/L   TCO2 11.8 0 - 100 mmol/L   Acid-base deficit 13.0 (H) 0.0 - 2.0 mmol/L   O2 Saturation 95.4 %   Patient temperature 99.1    Collection site A-LINE    Drawn by COLLECTED BY NURSE    Sample type ARTERIAL DRAW     Imaging / Studies: US Renal  07/08/2014   CLINICAL DATA:  Acute renal failure.  Hydronephrosis  EXAM: RENAL/URINARY TRACT ULTRASOUND COMPLETE  COMPARISON:  CT 06/23/2014  FINDINGS: Right Kidney:  Length: 8.6 cm. 13 mm upper pole cyst. Negative for hydronephrosis. Renal cortex normal in echogenicity.  Left Kidney:  Length: 9.7 cm. 12 mm upper pole cyst. Echogenicity within normal limits. No mass or hydronephrosis visualized.  Bladder:  Urinary bladder is empty with a Foley catheter.  Small amount of free fluid in the abdomen.  IMPRESSION: Small right kidney.  Negative for hydronephrosis.   Electronically Signed   By: Franchot Gallo M.D.   On: 07/08/2014 18:54   Dg Chest Port 1 View  07/09/2014   CLINICAL DATA:  Respiratory failure  EXAM: PORTABLE CHEST - 1 VIEW  COMPARISON:  07/10/2014  FINDINGS: Mild left basilar atelectasis. No focal consolidation. No pleural effusion or pneumothorax.  Endotracheal tube terminates 4.5 cm above the carina.  Left IJ venous catheter terminates in the mid SVC.  Enteric tube terminates in the gastric cardia.  IMPRESSION: No evidence of acute cardiopulmonary disease.  Endotracheal tube terminates 4.5 cm above the carina.  Additional support apparatus as above.   Electronically Signed   By: Julian Hy M.D.   On: 07/02/2014 09:52   Dg Chest Portable 1 View  07/11/2014    CLINICAL DATA:  Post intubation and central line placement.  EXAM: PORTABLE CHEST - 1 VIEW  COMPARISON:  CT chest and chest radiograph 06/28/2014.  FINDINGS: Endotracheal tube terminates approximately 2.2 cm above the carina. Left IJ central line tip projects over the upper SVC. Heart size stable. There may be minimal subsegmental atelectasis at the lung bases. Lungs are low in volume. No pleural fluid. No pneumothorax.  IMPRESSION: 1. Endotracheal tube terminates approximately 2.2 cm above the carina. Retracting approximately 1 cm would better position the tip above the carina. 2. Left IJ central line placement without pneumothorax.   Electronically Signed   By: Lorin Picket M.D.   On: 07/13/2014 18:39   Dg Chest Portable 1 View  07/03/2014   CLINICAL DATA:  Trauma patient. Loss of consciousness following 2x earlier today. Patient now complaining of abdominal, back and bilateral leg pain.  EXAM: PORTABLE CHEST - 1 VIEW  COMPARISON:  03/18/2013  FINDINGS: Apparent enlargement of the cardiac silhouette and mediastinal contours, in particular, there is apparent enlargement of the ascending and descending thoracic aorta. No focal airspace opacities. No pleural effusion or pneumothorax. No evidence of edema. No acute osseus abnormalities.  IMPRESSION: Apparent enlargement of the cardiac silhouette and mediastinal contours, possibly accentuated due to decreased lung volumes and patient rotation though a thoracic aortic aneurysm and/or dissection could have a similar appearance. Further evaluation could be performed with PA and lateral chest radiograph and/or chest CTA as clinically indicated.  Critical Value/emergent results were called by telephone at the time of interpretation on 06/24/2014 at 3:13 pm to Dr. Debby Freiberg , who verbally acknowledged these results.   Electronically Signed   By: Sandi Mariscal M.D.   On: 06/30/2014 15:15   Dg Abd Portable 1v  06/20/2014   CLINICAL DATA:  Evaluate nasogastric tube  placement.  EXAM: PORTABLE ABDOMEN - 1 VIEW  COMPARISON:  Chest radiograph of 1 day prior  FINDINGS: Motion degraded portable supine view. Nasogastric tube is looped in the stomach, with tip at proximal gastric body. Gas within the colon. No definite small bowel distention. The right side of the abdomen is excluded.  IMPRESSION: Nasogastric tube looped in the stomach, with tip at proximal stomach.   Electronically Signed   By: Abigail Miyamoto M.D.   On: 07/10/2014 09:51   Ct Angio Chest Aorta W/cm &/or Wo/cm  06/28/2014   CLINICAL DATA:  At least 2 unwitnessed falls at home earlier today. Decreasing a blood pressure while in the emergency department. Acute onset abdominal pain, lumbar back pain and bilateral lower extremity pain. Surgical history includes Nissen fundoplication, light cysts of intestinal adhesions, and TURP.  EXAM: CT ANGIOGRAPHY CHEST, ABDOMEN AND PELVIS  TECHNIQUE: Multidetector CT imaging through the chest, abdomen and pelvis was performed using the standard protocol before and during bolus administration of intravenous contrast. Multiplanar reconstructed images and MIPs were obtained and reviewed to evaluate the vascular anatomy.  CONTRAST:  100 ml Omnipaque  350 IV.  It is noted that the patient has an elevated BUN of 48 and creatinine of 3.90. However, the emergency department physician felt that the current symptoms warranted the use of intravenous iodinated contrast in order to exclude an aortic dissection.  COMPARISON:  CT abdomen and pelvis 02/13/2013, 01/03/2013, 04/28/2008. CT chest 04/28/2008.  FINDINGS: CTA CHEST FINDINGS  Respiratory motion blurs images throughout the examination of the chest, rendering the examination less than optimal. Unenhanced images demonstrate no evidence of mural hematoma involving the thoracic aorta. Enhanced images demonstrate no evidence of dissection or aneurysm. Mild atherosclerosis is present involving the descending thoracic aorta. Proximal great vessels  widely patent.  Heart size normal, though there may be mild left atrial enlargement. No visible coronary atherosclerosis. No pericardial effusion. Enlarged main pulmonary arteries, the right measuring up to 3.5 cm approximately diameter and the left up to approximately 3.0 cm diameter. No convincing evidence of central pulmonary embolism, though the timing of contrast enhancement was not optimal for visualization of the pulmonary arteries.  Emphysematous changes throughout both lungs. Pulmonary parenchyma clear without localized airspace consolidation, interstitial disease, or parenchymal nodules or masses. No pleural effusions. Central airways patent.  No significant mediastinal, hilar or axillary lymphadenopathy. Thyroid gland normal in size containing numerous small nodules, the largest approximating 8 mm. Fluid throughout the esophagus. Small hiatal hernia which will be discussed below.  Bone window images demonstrate degenerative disc disease and spondylosis involving the lower cervical spine with sclerosis involving the C7 vertebral body. No sclerotic vertebrae elsewhere in the visualized lower cervical were thoracic spine. Mild osseous demineralization is suspected.  Review of the MIP images confirms the above findings.  CTA ABDOMEN AND PELVIS FINDINGS  Respiratory motion blurs many of the images of the abdomen, particularly the upper abdomen, rendering the examination less than optimal. Unenhanced images demonstrate no evidence of mural thrombus involving the upper abdominal aorta. Enhanced images demonstrate mild atherosclerosis involving the distal abdominal aorta without evidence of aneurysm or dissection. Mild left common iliac atherosclerosis. Widely patent celiac artery, SMA, and IMA. Widely patent single renal arteries bilaterally.  There is a round collection of contrast adjacent to or involving the wall of the distal esophagus or gastric cardia at the EG junction, best seen on the coronal  reformatted images, measuring approximately 1.9 x 1.3 cm. This is immediately adjacent to surgical clips placed at the time of the prior Nissen procedure. Small to moderate sized recurrent hiatal hernia. Fluid-filled stomach which is not distended.  Severe diffuse hepatic steatosis. The liver is suboptimally imaged due to the respiratory motion, but no focal parenchymal abnormality is identified. Similarly, the spleen is unremarkable. I suspect diffuse enlargement of the pancreas and associated peripancreatic edema/inflammation. There is also edema/inflammation surrounding the descending portion of the duodenum.  Normal adrenal glands. Hydroureteronephrosis involving both kidneys without evidence of obstructing stone or mass. Severe scarring involving both kidneys. Patchy enhancement the kidneys consistent with the renal insufficiency. Cortical cysts involving both kidneys. No visible solid renal masses. No significant lymphadenopathy.  The remainder the small bowel is unremarkable. Moderate stool burden in the ascending and transverse colon, with borderline gaseous distension of the transverse colon. Descending colon, sigmoid colon and rectum relatively decompressed. Wall thickening over a several cm segment of the distal rectum to the anal verge. No free intraperitoneal air. Small amount of ascites dependently in the pelvis.  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 No visible masses at either inferior orifice to  account for the bilateral hydronephrosis. 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 Phleboliths low in both sides of the pelvis.  Bone window images demonstrate degenerative disc disease and spondylosis at L1-2 and L2-3, facet degenerative changes at L4-5 and L5-S1, and bone islands in the right iliac bone.  Review of the MIP images confirms the above findings.  IMPRESSION: 1. The examination was significantly degraded by respiratory motion. 2. No evidence of thoracic or  abdominal aortic aneurysm or dissection. 3. 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 4. Acute pancreatitis is suspected. 5. Acute duodenitis is also suspected. This may be secondary to the presumed pancreatitis. 6. Proctitis involving the distal rectum to the level of the anal verge. This likely explains the bright red blood per rectum. 7. Severe diffuse hepatic steatosis. 8. Bilateral hydronephrosis without obstructing stone or mass. As there is evidence of a diverticulum arising from the right lateral wall of the bladder, the hydronephrosis could be secondary to neurogenic bladder. Clinical correlation. 9. Scarring diffusely throughout both kidneys. 10. Widely patent visceral arteries. 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  No acute cardiopulmonary disease. 12. Enlarged main pulmonary arteries, measured above, consistent with pulmonary arterial hypertension. 13. Multiple subcentimeter nodules within a normal sized thyroid gland. This does not require further imaging followup. This follows ACR consensus guidelines: Managing Incidental Thyroid Nodules Detected on Imaging: White Paper of the ACR Incidental Thyroid Findings Committee. J Am Coll Radiol 2015; 12:143-150.  These results were discussed directly by telephone with Dr. Colin Rhein of the emergency department at 1625 hr and 1630 hr. She   Electronically Signed   By: Evangeline Dakin M.D.   On: 06/20/2014 16:46   Ct Cta Abd/pel W/cm &/or W/o Cm  06/19/2014   CLINICAL DATA:  At least 2 unwitnessed falls at home earlier today. Decreasing a blood pressure while in the emergency department. Acute onset abdominal pain, lumbar back pain and bilateral lower extremity pain. Surgical history includes Nissen fundoplication, light cysts of intestinal adhesions, and TURP.  EXAM: CT ANGIOGRAPHY CHEST, ABDOMEN AND PELVIS  TECHNIQUE: Multidetector CT  imaging through the chest, abdomen and pelvis was performed using the standard protocol before and during bolus administration of intravenous contrast. Multiplanar reconstructed images and MIPs were obtained and reviewed to evaluate the vascular anatomy.  CONTRAST:  100 ml Omnipaque 350 IV.  It is noted that the patient has an elevated BUN of 48 and creatinine of 3.90. However, the emergency department physician felt that the current symptoms warranted the use of intravenous iodinated contrast in order to exclude an aortic dissection.  COMPARISON:  CT abdomen and pelvis 02/13/2013, 01/03/2013, 04/28/2008. CT chest 04/28/2008.  FINDINGS: CTA CHEST FINDINGS  Respiratory motion blurs images throughout the examination of the chest, rendering the examination less than optimal. Unenhanced images demonstrate no evidence of mural hematoma involving the thoracic aorta. Enhanced images demonstrate no evidence of dissection or aneurysm. Mild atherosclerosis is present involving the descending thoracic aorta. Proximal great vessels widely patent.  Heart size normal, though there may be mild left atrial enlargement. No visible coronary atherosclerosis. No pericardial effusion. Enlarged main pulmonary arteries, the right measuring up to 3.5 cm approximately diameter and the left up to approximately 3.0 cm diameter. No convincing evidence of central pulmonary embolism, though the timing of contrast enhancement was not optimal for visualization of the pulmonary arteries.  Emphysematous changes throughout both lungs. Pulmonary parenchyma clear without localized  airspace consolidation, interstitial disease, or parenchymal nodules or masses. No pleural effusions. Central airways patent.  No significant mediastinal, hilar or axillary lymphadenopathy. Thyroid gland normal in size containing numerous small nodules, the largest approximating 8 mm. Fluid throughout the esophagus. Small hiatal hernia which will be discussed below.  Bone  window images demonstrate degenerative disc disease and spondylosis involving the lower cervical spine with sclerosis involving the C7 vertebral body. No sclerotic vertebrae elsewhere in the visualized lower cervical were thoracic spine. Mild osseous demineralization is suspected.  Review of the MIP images confirms the above findings.  CTA ABDOMEN AND PELVIS FINDINGS  Respiratory motion blurs many of the images of the abdomen, particularly the upper abdomen, rendering the examination less than optimal. Unenhanced images demonstrate no evidence of mural thrombus involving the upper abdominal aorta. Enhanced images demonstrate mild atherosclerosis involving the distal abdominal aorta without evidence of aneurysm or dissection. Mild left common iliac atherosclerosis. Widely patent celiac artery, SMA, and IMA. Widely patent single renal arteries bilaterally.  There is a round collection of contrast adjacent to or involving the wall of the distal esophagus or gastric cardia at the EG junction, best seen on the coronal reformatted images, measuring approximately 1.9 x 1.3 cm. This is immediately adjacent to surgical clips placed at the time of the prior Nissen procedure. Small to moderate sized recurrent hiatal hernia. Fluid-filled stomach which is not distended.  Severe diffuse hepatic steatosis. The liver is suboptimally imaged due to the respiratory motion, but no focal parenchymal abnormality is identified. Similarly, the spleen is unremarkable. I suspect diffuse enlargement of the pancreas and associated peripancreatic edema/inflammation. There is also edema/inflammation surrounding the descending portion of the duodenum.  Normal adrenal glands. Hydroureteronephrosis involving both kidneys without evidence of obstructing stone or mass. Severe scarring involving both kidneys. Patchy enhancement the kidneys consistent with the renal insufficiency. Cortical cysts involving both kidneys. No visible solid renal masses. No  significant lymphadenopathy.  The remainder the small bowel is unremarkable. Moderate stool burden in the ascending and transverse colon, with borderline gaseous distension of the transverse colon. Descending colon, sigmoid colon and rectum relatively decompressed. Wall thickening over a several cm segment of the distal rectum to the anal verge. No free intraperitoneal air. Small amount of ascites dependently in the pelvis.  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 No visible masses at either inferior orifice to account for the bilateral hydronephrosis. 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 Phleboliths low in both sides of the pelvis.  Bone window images demonstrate degenerative disc disease and spondylosis at L1-2 and L2-3, facet degenerative changes at L4-5 and L5-S1, and bone islands in the right iliac bone.  Review of the MIP images confirms the above findings.  IMPRESSION: 1. The examination was significantly degraded by respiratory motion. 2. No evidence of thoracic or abdominal aortic aneurysm or dissection. 3. 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 4. Acute pancreatitis is suspected. 5. Acute duodenitis is also suspected. This may be secondary to the presumed pancreatitis. 6. Proctitis involving the distal rectum to the level of the anal verge. This likely explains the bright red blood per rectum. 7. Severe diffuse hepatic steatosis. 8. Bilateral hydronephrosis without obstructing stone or mass. As there is evidence of a diverticulum arising from the right lateral wall of the bladder, the hydronephrosis could be secondary to neurogenic bladder. Clinical  correlation. 9. Scarring diffusely throughout both kidneys. 10. Widely patent visceral arteries. 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  No acute cardiopulmonary disease. 12. Enlarged main pulmonary  arteries, measured above, consistent with pulmonary arterial hypertension. 13. Multiple subcentimeter nodules within a normal sized thyroid gland. This does not require further imaging followup. This follows ACR consensus guidelines: Managing Incidental Thyroid Nodules Detected on Imaging: White Paper of the ACR Incidental Thyroid Findings Committee. J Am Coll Radiol 2015; 12:143-150.  These results were discussed directly by telephone with Dr. Colin Rhein of the emergency department at 1625 hr and 1630 hr. She   Electronically Signed   By: Evangeline Dakin M.D.   On: 07/09/2014 16:46    Medications / Allergies:  Scheduled Meds: . sodium chloride   Intravenous Once  . sodium chloride   Intravenous Once  . antiseptic oral rinse  7 mL Mouth Rinse QID  . chlorhexidine  15 mL Mouth Rinse BID  . imipenem-cilastatin  250 mg Intravenous Q6H  . pantoprazole (PROTONIX) IV  40 mg Intravenous QHS   Continuous Infusions: . fentaNYL infusion INTRAVENOUS 100 mcg/hr (06/23/14 0826)  . norepinephrine (LEVOPHED) Adult infusion 23 mcg/min (06/23/14 0910)  .  sodium bicarbonate  infusion 1000 mL 175 mL/hr at 06/23/14 0849  . vasopressin (PITRESSIN) infusion - *FOR SHOCK* 0.03 Units/min (06/23/14 0600)   PRN Meds:.sodium chloride, fentaNYL  Antibiotics: Anti-infectives    Start     Dose/Rate Route Frequency Ordered Stop   07/13/2014 2000  imipenem-cilastatin (PRIMAXIN) 250 mg in sodium chloride 0.9 % 100 mL IVPB     250 mg200 mL/hr over 30 Minutes Intravenous Every 6 hours 07/05/2014 1926     06/20/2014 0130  piperacillin-tazobactam (ZOSYN) IVPB 2.25 g  Status:  Discontinued     2.25 g100 mL/hr over 30 Minutes Intravenous Every 6 hours 06/25/2014 1905 07/07/2014 1921   07/09/2014 1900  piperacillin-tazobactam (ZOSYN) IVPB 3.375 g  Status:  Discontinued     3.375 g100 mL/hr over 30 Minutes Intravenous  Once 07/06/2014 1859 06/29/2014 1215   07/01/2014 1800  vancomycin (VANCOCIN) 1,500 mg in sodium chloride 0.9 % 500 mL IVPB      1,500 mg250 mL/hr over 120 Minutes Intravenous  Once 07/07/2014 1649 06/30/2014 2243   07/09/2014 1700  piperacillin-tazobactam (ZOSYN) IVPB 3.375 g     3.375 g100 mL/hr over 30 Minutes Intravenous  Once 06/19/2014 1649 07/03/2014 1752        Assessment/Plan Abdominal pain, lactic acidosis, sepsis, concern for acute abdomen POD#1 Negative exploratory laparotomy---Dr. Redmond Pulling -continue with NGT LWIS, await bowel function -leave dressing, will remove tomorrow and assess wound -SCD, coagulopathic--no chemical vte prophylaxis  -IVF per CCM -c/w atbx   Per CCM: Acute renal failure Acute respiratory failure Lactic acidosis Septic shock Coagulopathy Abnormal LFTs/shock liver  Erby Pian, ANP-BC [facility] Pager [phone](7A-4:30P) For consults and floor pages call [phone](7A-4:30P)  06/23/2014 11:04 AM

## 2014-06-23 NOTE — Progress Notes (Signed)
INITIAL NUTRITION ASSESSMENT  DOCUMENTATION CODES Per approved criteria  -Not Applicable   INTERVENTION: -When okay with surgery, Initiate Vital AF 1.2 via NG tube at 20 ml/hr, increase by 10 ml every 4-6 hours to goal rate of 80 ml/hr providing 2304 kcal, 144 g protein and 1557 ml free water daily.   NUTRITION DIAGNOSIS: Inadequate oral intake related to inability to eat as evidenced by NPO status.   Goal: Pt to meet >/=90% estimated needs  Monitor:  TF tolerance, weigh trends, labs  Reason for Assessment: Consult per MD/ TF initiation  64 y.o. male  Admitting Dx: Acute abd pain, lactic acidosis 17  ASSESSMENT: Pt hx of HLD, HTN, UTI, GERD, prediabetes, depression.  Pt is a heavy smoker.  Pt experiencing abdominal pain.  Pt discussed in ICU patient rounds today.  Pt lactic acid at 17 for 48 hrs, reason unknown. Pt has NG to LIS. PA from GI states to start tube feeding, but start slow and increase rate slowly.  Awaiting okay for TF from Surgery.   Pt currently on ventilator support: MVe: 16.1 Temp (24hrs), Avg:99.3 F (37.4 C), Min:98.4 F (36.9 C), Max:100.4 F (38 C)  Propofol: none   Height: Ht Readings from Last 1 Encounters:  07/11/2014 6' (1.829 m)    Weight: Wt Readings from Last 1 Encounters:  06/30/2014 208 lb 12.4 oz (94.7 kg)    Ideal Body Weight: 178 lbs  % Ideal Body Weight: 116%  Wt Readings from Last 10 Encounters:  06/23/2014 208 lb 12.4 oz (94.7 kg)  06/20/14 197 lb 2 oz (89.415 kg)  05/22/14 209 lb (94.802 kg)  04/17/14 215 lb 12.8 oz (97.886 kg)  02/25/14 212 lb (96.163 kg)  02/10/14 210 lb (95.255 kg)  11/08/13 216 lb (97.977 kg)  08/05/13 203 lb 3.2 oz (92.171 kg)  05/06/13 200 lb (90.719 kg)  03/20/13 195 lb 1.7 oz (88.5 kg)    Usual Body Weight: 210 lbs  % Usual Body Weight: 99%  BMI:  Body mass index is 28.31 kg/(m^2).  Estimated Nutritional Needs: Kcal: 2303 kcal  Protein: 130-145 g protein Fluid: >/= 2.3 L/day  Skin:  Abdomen incision  Diet Order: Diet NPO time specified  EDUCATION NEEDS: -No education needs identified at this time   Intake/Output Summary (Last 24 hours) at 06/23/14 0932 Last data filed at 06/23/14 0900  Gross per 24 hour  Intake 9145.22 ml  Output   1387 ml  Net 7758.22 ml    Last BM: PTA   Labs:   Recent Labs Lab 07/07/2014 2222 06/18/2014 0310  07/05/2014 1400 06/20/2014 2110 06/23/14 0500  NA 141 139  < > 142 141 141  K 5.4* 5.8*  < > 5.1 4.8 4.7  CL 103 107  < > 109 109 103  CO2 15* 11*  < > 12* 10* 12*  BUN 39* 42*  < > 42* 44* 47*  CREATININE 3.16* 3.42*  < > 3.27* 3.51* 3.98*  CALCIUM 6.0* 6.2*  < > 5.5* 5.3* 5.3*  MG 1.5 1.6  --   --   --  1.5  PHOS 7.0* 6.9*  --   --   --  5.6*  GLUCOSE 415* 154*  < > 101* 107* 98  < > = values in this interval not displayed.  CBG (last 3)   Recent Labs  07/03/2014 1840 07/13/2014 1849 07/07/2014 2326  GLUCAP 98 110* 122*    Scheduled Meds: . sodium chloride   Intravenous Once  .  sodium chloride   Intravenous Once  . antiseptic oral rinse  7 mL Mouth Rinse QID  . chlorhexidine  15 mL Mouth Rinse BID  . imipenem-cilastatin  250 mg Intravenous Q6H  . pantoprazole (PROTONIX) IV  40 mg Intravenous QHS    Continuous Infusions: . fentaNYL infusion INTRAVENOUS 100 mcg/hr (06/23/14 0826)  . norepinephrine (LEVOPHED) Adult infusion 23 mcg/min (06/23/14 0910)  .  sodium bicarbonate  infusion 1000 mL 175 mL/hr at 06/23/14 0849  . vasopressin (PITRESSIN) infusion - *FOR SHOCK* 0.03 Units/min (06/23/14 0600)    Past Medical History  Diagnosis Date  . Hyperlipidemia   . HTN (hypertension)   . Allergic rhinitis   . Headache(784.0)   . Nocturia   . Urinary retention   . Foley catheter in place   . UTI (urinary tract infection)   . GERD (gastroesophageal reflux disease)   . Prediabetes   . Depression   . Arthritis   . Bell's palsy     Past Surgical History  Procedure Laterality Date  . Hernia repair      inguinal  /left  . Nissen fundoplication    . Laparoscopic lysis intestinal adhesions    . Transurethral resection of prostate N/A 03/20/2013    Procedure: TRANSURETHRAL RESECTION OF THE PROSTATE WITH GYRUS INSTRUMENTS;  Surgeon: Molli Hazard, MD;  Location: [facility];  Service: Urology;  Laterality: N/A;  . Carpal tunnel release      left hand  . Nerve surgery      left arm    Elmer Picker MS Dietetic Intern Pager Number [phone]   I agree with student dietitian note; appropriate revisions have been made.  Molli Barrows, RD, LDN, [place] Pager# [phone] After Hours Pager# [phone]

## 2014-06-23 NOTE — Progress Notes (Signed)
CRITICAL VALUE ALERT  Critical value received:  Calcium 5.3  Date of notification:  06/23/2014  Time of notification:  0624 Critical value read back:Yes.    Nurse who received alert:  Mick Sell RN   MD notified (1st page):  Dr Titus Mould  Time of first page:  06/23/2014  MD notified (2nd page):  Time of second page:  Responding MD:  Dr Titus Mould  Time MD responded:  [id number]

## 2014-06-23 NOTE — Progress Notes (Signed)
Admit: 06/23/2014 LOS: 2  9M w/ AKI, normal BL SCr, 2/2 NSAID, CIN, Septic Shock, in setting of VRDF, Lactic Acidosis, ? Acute abdomen (neg ex-lap)  Subjective:  Negative ex-lap yesterday UOP 1.3L yesterday SCr slighltly up, BUN / K Stable Remains on 2 pressors  Micro NGTD Urology cs for chronic hydronephrolithiasis Persistent elevated lactate  02/07 0701 - 02/08 0700 In: 8852.8 [I.V.:6651.8; Blood:615; IV [id number]] Out: 2355 [DDUKG:[id number]]  Filed Weights   07/08/2014 2323 07/02/2014 0341  Weight: 94.7 kg (208 lb 12.4 oz) 94.7 kg (208 lb 12.4 oz)    Scheduled Meds: . sodium chloride   Intravenous Once  . sodium chloride   Intravenous Once  . antiseptic oral rinse  7 mL Mouth Rinse QID  . chlorhexidine  15 mL Mouth Rinse BID  . imipenem-cilastatin  250 mg Intravenous Q6H  . pantoprazole (PROTONIX) IV  40 mg Intravenous QHS   Continuous Infusions: . fentaNYL infusion INTRAVENOUS 100 mcg/hr (06/23/14 0826)  . norepinephrine (LEVOPHED) Adult infusion 23 mcg/min (06/23/14 0910)  .  sodium bicarbonate  infusion 1000 mL 175 mL/hr at 06/23/14 0849  . vasopressin (PITRESSIN) infusion - *FOR SHOCK* 0.03 Units/min (06/23/14 0600)   PRN Meds:.sodium chloride, fentaNYL  Current Labs: reviewed    Physical Exam:  Blood pressure 89/65, pulse 73, temperature 100.2 F (37.9 C), temperature source Core (Comment), resp. rate 35, height 6' (1.829 m), weight 94.7 kg (208 lb 12.4 oz), SpO2 100 %. Intubated, sedated Abd dressed Coarse BS b/l 2+ LEE, pitting RRR, nl s1s2  A/P 1. Nonoliguric AKI 2/2 ischemia, NSAIDs, CIN 1. No outpt nephrogy, apparent nl GFR at baseline 2. Stable metabolically 3. Decent UOP 4. No strong indication for RRT, can use diuretics for hypervolemia 5. Cont to assess daily / monitor closely 2. Persistent lactic acidosis, ? Related to hypotension / pressors / septic shock 3. Septic Shock on 2 pressors 4. ? Pancreatitis / duodenitis, neg ex-lap  06/25/2014 5. Nonobstructive chronic hydro 1. Urology following 2. Foley in place 3. Likely from reflux 4. No current issues, foley to remain  Pearson Grippe MD 06/23/2014, 9:52 AM   Recent Labs Lab 06/29/2014 2222 06/28/2014 0310  06/25/2014 1400 07/04/2014 2110 06/23/14 0500  NA 141 139  < > 142 141 141  K 5.4* 5.8*  < > 5.1 4.8 4.7  CL 103 107  < > 109 109 103  CO2 15* 11*  < > 12* 10* 12*  GLUCOSE 415* 154*  < > 101* 107* 98  BUN 39* 42*  < > 42* 44* 47*  CREATININE 3.16* 3.42*  < > 3.27* 3.51* 3.98*  CALCIUM 6.0* 6.2*  < > 5.5* 5.3* 5.3*  PHOS 7.0* 6.9*  --   --   --  5.6*  < > = values in this interval not displayed.  Recent Labs Lab 07/05/2014 1600 06/21/14 2222  06/18/2014 1000 07/06/2014 1400 06/23/14 0500  WBC 24.7* 17.4*  < > 10.0 6.3 14.1*  NEUTROABS 21.5* 15.0*  --   --   --  11.7*  HGB 13.9 11.2*  < > 11.2* 10.5* 10.7*  HCT 42.6 33.1*  < > 32.4* 31.0* 31.1*  MCV 102.2* 98.2  < > 95.0 97.2 94.2  PLT 104* 74*  < > 56*  54* 46* 62*  < > = values in this interval not displayed.

## 2014-06-23 NOTE — Progress Notes (Signed)
  Pt seen earlier this afternoon. Intubated. H/o bilateral hydronephrosis, non-obstructive. I saw patient last fall when his prostate exam and PSA were normal. On admission he had mild bilateral hydroureteronephrosis, bladder not overly distended. A foley was placed. A follow-up renal u/s showed no hydronephrosis.   Filed Vitals:   06/23/14 1617  BP: 83/42  Pulse: 79  Temp:   Resp:     Intake/Output Summary (Last 24 hours) at 06/23/14 1648 Last data filed at 06/23/14 1200  Gross per 24 hour  Intake 5339.14 ml  Output   1125 ml  Net 4214.14 ml   PE: pt intuabted; Foley in place; + 200 ml in foley bag, urine dark but clear  A/p - chronic mild hydronephrosis - resolved with foley -ARF - related to sepsis, contrast, etc.  -continue foley until patient ambulatory and UOP no longer needed. Will sign off.

## 2014-06-23 NOTE — Progress Notes (Signed)
Pt's HR brady to 47 and then up to 170-190 sustained. EKG shows afib RVR. Dr. Nelda Marseille and P. Hoffman NP notified and new orders carried out.

## 2014-06-23 NOTE — Progress Notes (Signed)
PULMONARY / CRITICAL CARE MEDICINE   Name: [name] MRN: [id number] DOB: 1950/09/19   PCP Unk Pinto DAVID, MD [facility] - Dr Alben Spittle [facility] pum - [facility] Lions - last seen may 2012  ADMISSION DATE:  07/08/2014 CONSULTATION DATE:  06/18/2014   REFERRING MD :  ER at [facility]  CHIEF COMPLAINT:  Acute abd pain, lactic acidosis 17 . Mottled intubated  BRIEF:  64 year old male  With bp, dm, lipid, pre-dm and vitamin d def, with no family locally (brother in [place]) - hx from ER RN/MD. 5d hx of abdominal pain, weakness and falling. EMS found him hypoglycemic and hypothermic. In ER tachypneic and mottled needing intubation. Never dropped bP but lactic acidosis of 17 despite fluids and without clearance. PCCM Asked to admit. 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 Other findings include emphysema and non obstructive hydronephrosis  2012 eval for dyspnea  - Ex heavy passive smoker. Has chronic dyspnea on exertion with hyooxemia at rest. Dysnea is at class 2-3 level. On walking he does not sem to desaturate ad in fact pulse ox picked up suggesting underlying atelectasis related to raised left diaphragm. . 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 CPST test 05/24/2010 is normal but for mild reduction in HRresponse due to beta blocker and mild elevation in dead space possibly rlated to the left diaprhagm issue. There was no evidence of exercise induced bronchospasm on CPST. He comenced pulmonary rehab in March 2012 andd by May 2012 he had improved significantly with pulmnary rehab. This suggests deconditioning and weight issues and diaphragm as main causes of dyspnea  PAST MEDICAL HISTORY :   has a past medical history of Hyperlipidemia; HTN (hypertension); Allergic rhinitis; Headache(784.0); Nocturia; Urinary retention; Foley catheter in place; UTI (urinary  tract infection); GERD (gastroesophageal reflux disease); Prediabetes; Depression; Arthritis; and Bell's palsy.  has past surgical history that includes Hernia repair; Nissen fundoplication; Laparoscopic lysis intestinal adhesions; Transurethral resection of prostate (N/A, 03/20/2013); Carpal tunnel release; Nerve Surgery; and laparotomy (N/A, 06/18/2014).     SIGNIFICANT EVENTS: 07/09/2014 - admit, intubated and left IJ at [place]  07/11/2014: continued lactic acidosis > 15 -> OR nil acute finding on pLAP other than acute pancreatitis. Significant abd pain + -> improved after fent gtt and patient began to communicate better. Renal failure now - urology thinks is non obst hydro. ATN - from sepsis, pancreatitis, meloxicam and ct dye per renal. On bicarb gtt. On levophed. Coagulopathic as well    SUBJECTIVE/OVERNIGHT/INTERVAL HX 06/23/14: on 5mcg levophed.  Making some urine. Creat some better. Abd pain controlled with fent gtt. Awake - denies complaints. However, lactate still 17 and not clearing. Coags better  VITAL SIGNS: Temp:  [98.2 F (36.8 C)-100.4 F (38 C)] 100.4 F (38 C) (02/08 1200) Pulse Rate:  [35-88] 83 (02/08 1200) Resp:  [35] 35 (02/08 0401) BP: (83-105)/(43-81) 88/43 mmHg (02/08 1100) SpO2:  [78 %-100 %] 100 % (02/08 1200) Arterial Line BP: (87-126)/(45-66) 117/62 mmHg (02/08 1000) FiO2 (%):  [30 %-40 %] 30 % (02/08 0859) HEMODYNAMICS: CVP:  [12 mmHg-13 mmHg] 12 mmHg VENTILATOR SETTINGS: Vent Mode:  [-] Spontaneous FiO2 (%):  [30 %-40 %] 30 % Set Rate:  [35 bmp] 35 bmp Vt Set:  [650 mL] 650 mL PEEP:  [5 cmH20] 5 cmH20 Pressure Support:  [5 cmH20] 5 cmH20 Plateau Pressure:  [20 cmH20-22 cmH20] 20  cmH20 INTAKE / OUTPUT:  Intake/Output Summary (Last 24 hours) at 06/23/14 1210 Last data filed at 06/23/14 1200  Gross per 24 hour  Intake 7707.64 ml  Output   1375 ml  Net 6332.64 ml    PHYSICAL EXAMINATION: General:  Well built male. Intubated. Looks criticallill Neuro:   Post intubation  Sedated. RASS +1. CAM_ICU negative for delirum  HEENT:  ET tube + Cardiovascular:  Tachycardic. Normal heart sounds Lungs:  Sync with vent. CTA bilaterally Abdomen:  Soft, No mass. Sluuggish bowel sounds. No rigidity Musculoskeletal:  No cyanosis, no clubbing,. No edema , bilateral feet cold and discolored but no gangrene Skin:  Mottled in forearms and feet, knees look bruised  LABS: PULMONARY  Recent Labs Lab 06/23/2014 0351 06/28/2014 1125 06/24/2014 1524 07/07/2014 2040 06/23/14 0521  PHART 7.244* 7.346* 7.342* 7.310* 7.377  PCO2ART 18.7* 19.3* 17.9* 19.4* 19.7*  PO2ART 155.0* 153.0* 85.0 143.0* 104.0*  HCO3 7.8* 10.5* 9.7* 9.7* 11.2*  TCO2 8.4 11 10 10  11.8  O2SAT 98.0 99.0 96.0 99.0 95.4    CBC  Recent Labs Lab 07/11/2014 1000 07/11/2014 1400 06/23/14 0500  HGB 11.2* 10.5* 10.7*  HCT 32.4* 31.0* 31.1*  WBC 10.0 6.3 14.1*  PLT 56*  54* 46* 62*    COAGULATION  Recent Labs Lab 06/20/14 1230 07/08/2014 1445 06/19/2014 2222 06/25/2014 1000 06/23/14 0500  INR 1.4* 2.34* 2.57* 2.11*  2.11* 1.91*    CARDIAC    Recent Labs Lab 06/17/2014 2222 07/06/2014 0310 07/03/2014 0630  TROPONINI 0.05* 0.05* 0.05*   No results for input(s): PROBNP in the last 168 hours.   CHEMISTRY  Recent Labs Lab 06/17/2014 2222 06/16/2014 0310 06/16/2014 1000 06/17/2014 1400 06/19/2014 2110 06/23/14 0500  NA 141 139 140 142 141 141  K 5.4* 5.8* 5.7* 5.1 4.8 4.7  CL 103 107 109 109 109 103  CO2 15* 11* 14* 12* 10* 12*  GLUCOSE 415* 154* 104* 101* 107* 98  BUN 39* 42* 43* 42* 44* 47*  CREATININE 3.16* 3.42* 3.61* 3.27* 3.51* 3.98*  CALCIUM 6.0* 6.2* 5.6* 5.5* 5.3* 5.3*  MG 1.5 1.6  --   --   --  1.5  PHOS 7.0* 6.9*  --   --   --  5.6*   Estimated Creatinine Clearance: 22.1 mL/min (by C-G formula based on Cr of 3.98).   LIVER  Recent Labs Lab 06/20/14 1230 06/19/2014 1445 06/16/2014 2222 06/16/2014 0310 06/24/2014 1000 07/02/2014 2055 06/23/14 0500  AST 192*  192* 191* 175*  182*  --   --   --   ALT 312*  312* 228* 163* 154*  --   --   --   ALKPHOS 80  80 81 70 69  --   --   --   BILITOT 5.3*  5.3* 4.4* 3.6* 3.9*  --   --   --   PROT 5.3*  5.3* 4.2* 3.4* 4.0*  --   --   --   ALBUMIN 3.1*  3.1* 2.5* 1.8* 2.2*  --  1.9* 1.8*  INR 1.4* 2.34* 2.57*  --  2.11*  2.11*  --  1.91*     INFECTIOUS  Recent Labs Lab 07/11/2014 2222 06/21/2014 1000 07/06/2014 1307 06/21/2014 1707 06/23/14 0500  LATICACIDVEN 16.8* 15.9* 13.4* 17.4* 17.1*  PROCALCITON 4.67 8.35  --   --  7.79     ENDOCRINE CBG (last 3)   Recent Labs  07/09/2014 1840 07/09/2014 1849 07/07/2014 2326  GLUCAP 98 110* 122*  IMAGING x48h US Renal  07/01/2014   CLINICAL DATA:  Acute renal failure.  Hydronephrosis  EXAM: RENAL/URINARY TRACT ULTRASOUND COMPLETE  COMPARISON:  CT 07/13/2014  FINDINGS: Right Kidney:  Length: 8.6 cm. 13 mm upper pole cyst. Negative for hydronephrosis. Renal cortex normal in echogenicity.  Left Kidney:  Length: 9.7 cm. 12 mm upper pole cyst. Echogenicity within normal limits. No mass or hydronephrosis visualized.  Bladder:  Urinary bladder is empty with a Foley catheter.  Small amount of free fluid in the abdomen.  IMPRESSION: Small right kidney.  Negative for hydronephrosis.   Electronically Signed   By: Franchot Gallo M.D.   On: 06/29/2014 18:54   Dg Chest Port 1 View  06/16/2014   CLINICAL DATA:  Respiratory failure  EXAM: PORTABLE CHEST - 1 VIEW  COMPARISON:  06/23/2014  FINDINGS: Mild left basilar atelectasis. No focal consolidation. No pleural effusion or pneumothorax.  Endotracheal tube terminates 4.5 cm above the carina.  Left IJ venous catheter terminates in the mid SVC.  Enteric tube terminates in the gastric cardia.  IMPRESSION: No evidence of acute cardiopulmonary disease.  Endotracheal tube terminates 4.5 cm above the carina.  Additional support apparatus as above.   Electronically Signed   By: Julian Hy M.D.   On: 06/25/2014 09:52   Dg Chest Portable  1 View  06/25/2014   CLINICAL DATA:  Post intubation and central line placement.  EXAM: PORTABLE CHEST - 1 VIEW  COMPARISON:  CT chest and chest radiograph 06/18/2014.  FINDINGS: Endotracheal tube terminates approximately 2.2 cm above the carina. Left IJ central line tip projects over the upper SVC. Heart size stable. There may be minimal subsegmental atelectasis at the lung bases. Lungs are low in volume. No pleural fluid. No pneumothorax.  IMPRESSION: 1. Endotracheal tube terminates approximately 2.2 cm above the carina. Retracting approximately 1 cm would better position the tip above the carina. 2. Left IJ central line placement without pneumothorax.   Electronically Signed   By: Lorin Picket M.D.   On: 06/28/2014 18:39   Dg Chest Portable 1 View  07/06/2014   CLINICAL DATA:  Trauma patient. Loss of consciousness following 2x earlier today. Patient now complaining of abdominal, back and bilateral leg pain.  EXAM: PORTABLE CHEST - 1 VIEW  COMPARISON:  03/18/2013  FINDINGS: Apparent enlargement of the cardiac silhouette and mediastinal contours, in particular, there is apparent enlargement of the ascending and descending thoracic aorta. No focal airspace opacities. No pleural effusion or pneumothorax. No evidence of edema. No acute osseus abnormalities.  IMPRESSION: Apparent enlargement of the cardiac silhouette and mediastinal contours, possibly accentuated due to decreased lung volumes and patient rotation though a thoracic aortic aneurysm and/or dissection could have a similar appearance. Further evaluation could be performed with PA and lateral chest radiograph and/or chest CTA as clinically indicated.  Critical Value/emergent results were called by telephone at the time of interpretation on 06/27/2014 at 3:13 pm to Dr. Debby Freiberg , who verbally acknowledged these results.   Electronically Signed   By: Sandi Mariscal M.D.   On: 06/18/2014 15:15   Dg Abd Portable 1v  06/22/2014   CLINICAL DATA:  Evaluate  nasogastric tube placement.  EXAM: PORTABLE ABDOMEN - 1 VIEW  COMPARISON:  Chest radiograph of 1 day prior  FINDINGS: Motion degraded portable supine view. Nasogastric tube is looped in the stomach, with tip at proximal gastric body. Gas within the colon. No definite small bowel distention. The right side of the abdomen is excluded.  IMPRESSION: Nasogastric tube looped in the stomach, with tip at proximal stomach.   Electronically Signed   By: Abigail Miyamoto M.D.   On: 06/25/2014 09:51   Ct Angio Chest Aorta W/cm &/or Wo/cm  07/05/2014   CLINICAL DATA:  At least 2 unwitnessed falls at home earlier today. Decreasing a blood pressure while in the emergency department. Acute onset abdominal pain, lumbar back pain and bilateral lower extremity pain. Surgical history includes Nissen fundoplication, light cysts of intestinal adhesions, and TURP.  EXAM: CT ANGIOGRAPHY CHEST, ABDOMEN AND PELVIS  TECHNIQUE: Multidetector CT imaging through the chest, abdomen and pelvis was performed using the standard protocol before and during bolus administration of intravenous contrast. Multiplanar reconstructed images and MIPs were obtained and reviewed to evaluate the vascular anatomy.  CONTRAST:  100 ml Omnipaque 350 IV.  It is noted that the patient has an elevated BUN of 48 and creatinine of 3.90. However, the emergency department physician felt that the current symptoms warranted the use of intravenous iodinated contrast in order to exclude an aortic dissection.  COMPARISON:  CT abdomen and pelvis 02/13/2013, 01/03/2013, 04/28/2008. CT chest 04/28/2008.  FINDINGS: CTA CHEST FINDINGS  Respiratory motion blurs images throughout the examination of the chest, rendering the examination less than optimal. Unenhanced images demonstrate no evidence of mural hematoma involving the thoracic aorta. Enhanced images demonstrate no evidence of dissection or aneurysm. Mild atherosclerosis is present involving the descending thoracic aorta.  Proximal great vessels widely patent.  Heart size normal, though there may be mild left atrial enlargement. No visible coronary atherosclerosis. No pericardial effusion. Enlarged main pulmonary arteries, the right measuring up to 3.5 cm approximately diameter and the left up to approximately 3.0 cm diameter. No convincing evidence of central pulmonary embolism, though the timing of contrast enhancement was not optimal for visualization of the pulmonary arteries.  Emphysematous changes throughout both lungs. Pulmonary parenchyma clear without localized airspace consolidation, interstitial disease, or parenchymal nodules or masses. No pleural effusions. Central airways patent.  No significant mediastinal, hilar or axillary lymphadenopathy. Thyroid gland normal in size containing numerous small nodules, the largest approximating 8 mm. Fluid throughout the esophagus. Small hiatal hernia which will be discussed below.  Bone window images demonstrate degenerative disc disease and spondylosis involving the lower cervical spine with sclerosis involving the C7 vertebral body. No sclerotic vertebrae elsewhere in the visualized lower cervical were thoracic spine. Mild osseous demineralization is suspected.  Review of the MIP images confirms the above findings.  CTA ABDOMEN AND PELVIS FINDINGS  Respiratory motion blurs many of the images of the abdomen, particularly the upper abdomen, rendering the examination less than optimal. Unenhanced images demonstrate no evidence of mural thrombus involving the upper abdominal aorta. Enhanced images demonstrate mild atherosclerosis involving the distal abdominal aorta without evidence of aneurysm or dissection. Mild left common iliac atherosclerosis. Widely patent celiac artery, SMA, and IMA. Widely patent single renal arteries bilaterally.  There is a round collection of contrast adjacent to or involving the wall of the distal esophagus or gastric cardia at the EG junction, best seen on  the coronal reformatted images, measuring approximately 1.9 x 1.3 cm. This is immediately adjacent to surgical clips placed at the time of the prior Nissen procedure. Small to moderate sized recurrent hiatal hernia. Fluid-filled stomach which is not distended.  Severe diffuse hepatic steatosis. The liver is suboptimally imaged due to the respiratory motion, but no focal parenchymal abnormality is identified. Similarly, the spleen is unremarkable. I suspect diffuse enlargement of the  pancreas and associated peripancreatic edema/inflammation. There is also edema/inflammation surrounding the descending portion of the duodenum.  Normal adrenal glands. Hydroureteronephrosis involving both kidneys without evidence of obstructing stone or mass. Severe scarring involving both kidneys. Patchy enhancement the kidneys consistent with the renal insufficiency. Cortical cysts involving both kidneys. No visible solid renal masses. No significant lymphadenopathy.  The remainder the small bowel is unremarkable. Moderate stool burden in the ascending and transverse colon, with borderline gaseous distension of the transverse colon. Descending colon, sigmoid colon and rectum relatively decompressed. Wall thickening over a several cm segment of the distal rectum to the anal verge. No free intraperitoneal air. Small amount of ascites dependently in the pelvis.  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 No visible masses at either inferior orifice to account for the bilateral hydronephrosis. 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 Phleboliths low in both sides of the pelvis.  Bone window images demonstrate degenerative disc disease and spondylosis at L1-2 and L2-3, facet degenerative changes at L4-5 and L5-S1, and bone islands in the right iliac bone.  Review of the MIP images confirms the above findings.  IMPRESSION: 1. The examination was significantly degraded by respiratory motion. 2. No evidence of  thoracic or abdominal aortic aneurysm or dissection. 3. 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 4. Acute pancreatitis is suspected. 5. Acute duodenitis is also suspected. This may be secondary to the presumed pancreatitis. 6. Proctitis involving the distal rectum to the level of the anal verge. This likely explains the bright red blood per rectum. 7. Severe diffuse hepatic steatosis. 8. Bilateral hydronephrosis without obstructing stone or mass. As there is evidence of a diverticulum arising from the right lateral wall of the bladder, the hydronephrosis could be secondary to neurogenic bladder. Clinical correlation. 9. Scarring diffusely throughout both kidneys. 10. Widely patent visceral arteries. 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  No acute cardiopulmonary disease. 12. Enlarged main pulmonary arteries, measured above, consistent with pulmonary arterial hypertension. 13. Multiple subcentimeter nodules within a normal sized thyroid gland. This does not require further imaging followup. This follows ACR consensus guidelines: Managing Incidental Thyroid Nodules Detected on Imaging: White Paper of the ACR Incidental Thyroid Findings Committee. J Am Coll Radiol 2015; 12:143-150.  These results were discussed directly by telephone with Dr. Colin Rhein of the emergency department at 1625 hr and 1630 hr. She   Electronically Signed   By: Evangeline Dakin M.D.   On: 06/27/2014 16:46   Ct Cta Abd/pel W/cm &/or W/o Cm  06/23/2014   CLINICAL DATA:  At least 2 unwitnessed falls at home earlier today. Decreasing a blood pressure while in the emergency department. Acute onset abdominal pain, lumbar back pain and bilateral lower extremity pain. Surgical history includes Nissen fundoplication, light cysts of intestinal adhesions, and TURP.  EXAM: CT ANGIOGRAPHY CHEST, ABDOMEN AND PELVIS  TECHNIQUE:  Multidetector CT imaging through the chest, abdomen and pelvis was performed using the standard protocol before and during bolus administration of intravenous contrast. Multiplanar reconstructed images and MIPs were obtained and reviewed to evaluate the vascular anatomy.  CONTRAST:  100 ml Omnipaque 350 IV.  It is noted that the patient has an elevated BUN of 48 and creatinine of 3.90. However, the emergency department physician felt that the current symptoms warranted the use of intravenous iodinated contrast in order to exclude an aortic dissection.  COMPARISON:  CT abdomen  and pelvis 02/13/2013, 01/03/2013, 04/28/2008. CT chest 04/28/2008.  FINDINGS: CTA CHEST FINDINGS  Respiratory motion blurs images throughout the examination of the chest, rendering the examination less than optimal. Unenhanced images demonstrate no evidence of mural hematoma involving the thoracic aorta. Enhanced images demonstrate no evidence of dissection or aneurysm. Mild atherosclerosis is present involving the descending thoracic aorta. Proximal great vessels widely patent.  Heart size normal, though there may be mild left atrial enlargement. No visible coronary atherosclerosis. No pericardial effusion. Enlarged main pulmonary arteries, the right measuring up to 3.5 cm approximately diameter and the left up to approximately 3.0 cm diameter. No convincing evidence of central pulmonary embolism, though the timing of contrast enhancement was not optimal for visualization of the pulmonary arteries.  Emphysematous changes throughout both lungs. Pulmonary parenchyma clear without localized airspace consolidation, interstitial disease, or parenchymal nodules or masses. No pleural effusions. Central airways patent.  No significant mediastinal, hilar or axillary lymphadenopathy. Thyroid gland normal in size containing numerous small nodules, the largest approximating 8 mm. Fluid throughout the esophagus. Small hiatal hernia which will be discussed  below.  Bone window images demonstrate degenerative disc disease and spondylosis involving the lower cervical spine with sclerosis involving the C7 vertebral body. No sclerotic vertebrae elsewhere in the visualized lower cervical were thoracic spine. Mild osseous demineralization is suspected.  Review of the MIP images confirms the above findings.  CTA ABDOMEN AND PELVIS FINDINGS  Respiratory motion blurs many of the images of the abdomen, particularly the upper abdomen, rendering the examination less than optimal. Unenhanced images demonstrate no evidence of mural thrombus involving the upper abdominal aorta. Enhanced images demonstrate mild atherosclerosis involving the distal abdominal aorta without evidence of aneurysm or dissection. Mild left common iliac atherosclerosis. Widely patent celiac artery, SMA, and IMA. Widely patent single renal arteries bilaterally.  There is a round collection of contrast adjacent to or involving the wall of the distal esophagus or gastric cardia at the EG junction, best seen on the coronal reformatted images, measuring approximately 1.9 x 1.3 cm. This is immediately adjacent to surgical clips placed at the time of the prior Nissen procedure. Small to moderate sized recurrent hiatal hernia. Fluid-filled stomach which is not distended.  Severe diffuse hepatic steatosis. The liver is suboptimally imaged due to the respiratory motion, but no focal parenchymal abnormality is identified. Similarly, the spleen is unremarkable. I suspect diffuse enlargement of the pancreas and associated peripancreatic edema/inflammation. There is also edema/inflammation surrounding the descending portion of the duodenum.  Normal adrenal glands. Hydroureteronephrosis involving both kidneys without evidence of obstructing stone or mass. Severe scarring involving both kidneys. Patchy enhancement the kidneys consistent with the renal insufficiency. Cortical cysts involving both kidneys. No visible solid  renal masses. No significant lymphadenopathy.  The remainder the small bowel is unremarkable. Moderate stool burden in the ascending and transverse colon, with borderline gaseous distension of the transverse colon. Descending colon, sigmoid colon and rectum relatively decompressed. Wall thickening over a several cm segment of the distal rectum to the anal verge. No free intraperitoneal air. Small amount of ascites dependently in the pelvis.  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 No visible masses at either inferior orifice to account for the bilateral hydronephrosis. 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 Phleboliths low in both sides of the pelvis.  Bone window images demonstrate degenerative disc disease and spondylosis at L1-2 and L2-3, facet degenerative changes at L4-5 and L5-S1, and bone islands in the right iliac bone.  Review of the MIP images confirms the above findings.  IMPRESSION: 1. The examination was significantly degraded by respiratory motion. 2. No evidence of thoracic or abdominal aortic aneurysm or dissection. 3. 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 4. Acute pancreatitis is suspected. 5. Acute duodenitis is also suspected. This may be secondary to the presumed pancreatitis. 6. Proctitis involving the distal rectum to the level of the anal verge. This likely explains the bright red blood per rectum. 7. Severe diffuse hepatic steatosis. 8. Bilateral hydronephrosis without obstructing stone or mass. As there is evidence of a diverticulum arising from the right lateral wall of the bladder, the hydronephrosis could be secondary to neurogenic bladder. Clinical correlation. 9. Scarring diffusely throughout both kidneys. 10. Widely patent visceral arteries. 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  No acute cardiopulmonary disease. 12. Enlarged  main pulmonary arteries, measured above, consistent with pulmonary arterial hypertension. 13. Multiple subcentimeter nodules within a normal sized thyroid gland. This does not require further imaging followup. This follows ACR consensus guidelines: Managing Incidental Thyroid Nodules Detected on Imaging: White Paper of the ACR Incidental Thyroid Findings Committee. J Am Coll Radiol 2015; 12:143-150.  These results were discussed directly by telephone with Dr. Colin Rhein of the emergency department at 1625 hr and 1630 hr. She   Electronically Signed   By: Evangeline Dakin M.D.   On: 07/12/2014 16:46         ASSESSMENT / PLAN:  PULMONARY OETT 06/25/2014 ER A:acute resp failure due to renal failure, lactic acidosis and pain and metabolic demand   P:   PRVC  CARDIOVASCULAR CVL- LEft IJ 06/19/2014  A: Circulatory shockk - pancreatitis and sepsis  P:  Hydrate CVP goal >10 with fluids MAP goal > 65 with pressors Await  echo   RENAL A:   Acute renal failure  - non obst hydro on CT Proffound lactic acidosis - not on metformin, invokana   - acidotic with poor lactate clearance x 48h. Worrise. Unclear why not clearing  P:   REnal consult - conservative mgmt for now -> CRRT if worsens Urology consult - monitor -> renal US -> no hydro Hydrate aggressively Track lactate q4h Foley Check CK stat  GASTROINTESTINAL   A: #Baseline  - sees Dr Deatra Ina. Gastroparesis +   # Current   Acute  pancreatitis with ? Shock liver    - s/p plap 06/29/2014 - pancreatitis only finding   P:   NPO CCS input appreciated  HEMATOLOGIC A:  Coagulopathic  - improved   P:  Monitor closely  INFECTIOUS Blood  Urine Trach aspirate  A:  Septic shock based on PCT. No source beyond pancreatitis indentified P:   Imipenem  ENDOCRINE A:  PRe-DM at baseline   P:   ssi   NEUROLOGIC A:  At risk for delirium   - pain well controlled with fent gtt P:   Fent prn -> gtt NO  diprivan due to  acidosis  FAMILY  - Updates:  No family at bedside. Brother in [place]. Patient updated somewhat 06/23/2014   - Inter-disciplinary family meet or Palliative Care meeting due by: 06/28/14    The patient is critically ill with multiple organ systems failure and requires high complexity decision making for assessment and support, frequent evaluation and titration of therapies, application of advanced monitoring technologies and extensive interpretation of multiple databases.   Critical Care Time devoted to patient  care services described in this note is  35  Minutes. This time reflects time of care of this signee Dr Brand Males. This critical care time does not reflect procedure time, or teaching time or supervisory time of PA/NP/Med student/Med Resident etc but could involve care discussion time      Dr. Brand Males, M.D., [facility].C.P Pulmonary and Critical Care Medicine Staff Physician [facility] Pager: [phone], If no answer or between  15:00h - 7:00h: call [phone]  06/23/2014 12:10 PM

## 2014-06-23 NOTE — Progress Notes (Signed)
PT's BP remains 70s/40s with map 49, Dr. Nelda Marseille notified and new orders carried out.

## 2014-06-23 NOTE — Progress Notes (Signed)
UR Completed.  [phone]  

## 2014-06-23 NOTE — Progress Notes (Signed)
Called lab approx [phone] because 1350 lactic acid has not resulted yet. Spoke with Maggy. At this moment Maggy stated that she would have to call me back to figure out where the lactic acid tube is and why it has not resulted. All other labs in the same bag and tube carrier have resulted. Notified Dr. Nelda Marseille and Dr. Chase Caller

## 2014-06-23 NOTE — Progress Notes (Signed)
eLink Physician-Brief Progress Note Patient Name: CLEARANCE CHENAULT DOB: [birth year] MRN: [id number]   Date of Service  06/23/2014  HPI/Events of Note  A-fib with RVR, hypotensive.  eICU Interventions  Change levo to neo. Amiodarone ordered. If remains hypotensive will cardiovert.        YACOUB,WESAM 06/23/2014, 8:22 PM

## 2014-06-23 NOTE — Consult Note (Signed)
[facility] Consult: 1:30 PM 06/23/2014  LOS: 2 days    Referring Provider: Dr Chase Caller.  Primary Care Physician:  Alesia Richards, MD Primary Gastroenterologist:  Dr. Deatra Ina.      Reason for Consultation:  Pancreatitis.    HPI: [name] is a 64 y.o. male.  Hx htn, hld.  Chronic kidney disease (baseline stage 3a, acutely stage 4 at present). Chronic hydronephrosis since 2009.  S/p TURP 02/2013.  Previous left inguinal hernia repair with mesh. Hx iron def anemia ~ 2010. EGD then with ulcer, gastritis, HH with stomach in chest.  HH was surgically repaired with Nissen and LOA 2010 (Dr Ronnald Collum).    For evaluation of chronic abdominal pain, Dr Deatra Ina performed colonoscopy 04/22/2014: colon adenomas.  04/22/2014 EGD Duodenitis, gastritis, retained gastric contents.  Gastroparesis on 05/13/14 gastric emptying study (79% retention at 2 hours).  Abnormal LFTs in 03/2014 with AST 182 and ALT 317. Alkaline phosphatase normal.  Normal Hep A and C serologies. Hep B surface antibody positive but core antibody negative.  Abdominal ultrasound 06/02/14 with small liver cysts and renal cysts, obscured pancreas 5.5 mm CBD, contracted GB but no stones or wall thickening.   Abnormal LFTs 05/22/14 ( AST/ALT 180s/317, normal alk phos, t bili 1.4).    ANA and AMA ordered 06/20/14 are pending.    Seen 06/20/13 by Dr Deatra Ina for follow up of epigastric and periumbilical abdominal pain, worsenened post prandialy. 10 # weight loss. Abnormal LFTs.  Metoclopramide had not helped. Continued on daily Omeprazole 40 mg.   Admitted 06/17/2014 with multi organ failure, diffuse abdominal pain, pancreatitis, elevated lactate/acidotic, DIC, skin mottling, hypothermia, hypoglycemic.  Required intubation/vent in ED.   CT abdomen/pelvis 2/6:  Possible  pseudoaneurysm at branch of left gastric artery near surgical clips.  Suspect acute pancreatitis and associated duodenitis.  + proctitis. Diffuse hepatic steatosis. Widely patent visceral arteries.  Bil hydronephrosis. Small thyroid nodules. LFTs rising. Renal function progressively worsening.  Currently the AST is 170s to 180s, ALT is 160s to 150s.  Normal alk phos, t bili 3.9.  Lipase 359 - 265.   Went to exploratory laparotomy 06/18/2014 (Dr Redmond Pulling).  No evidence of ischemic large or small bowel.  Small mesenteric tear in SB was repaired. "Pancreas felt hardened".  Post op diagnosis was of acute pancreatitis.   Currently comfortable on Fentanyl gtt, Protonix 40 IV q day. On Levophed, vasopressin for pressure support.  Nods head "no" to question of drinking ETOH.     PREVIOUS ENDOSCOPIC STUDIES: 2/21015  Colonoscopy: screening study, mother died in her 47s with colon cancer. ENDOSCOPIC IMPRESSION: 1. Sessile polyp was found at the cecum; polypectomy was performedwith a cold snare 2. Sessile polyp was found in the descending colon; polypectomywas performed with a cold snare 3. The examination was otherwise normal Cecal polyp pathology:  - TUBULAR ADENOMA (X1). - BENIGN COLORECTAL MUCOSA, ADDITIONAL FRAGMENTS. - NO HIGH GRADE DYSPLASIA OR MALIGNANCY IDENTIFIED.  04/2014  EGD: for epigastric pain ENDOSCOPIC IMPRESSION: 1. Duodenal inflammation was found in the duodenal bulb 2. Retained gastric contents  3. Chronic gastritis (inflammation) was found in the gastric fundus; multiple biopsies were performed Gastric pathology - BENIGN OXYNTIC GASTRIC MUCOSA WITH MILD CHRONIC INACTIVE GASTRITIS. - WARTHIN-STARRY STAIN IS NEGATIVE FOR HELICOBACTER PYLORI. - NO INTESTINAL METAPLASIA, DYSPLASIA, OR MALIGNANCY.  2010 Colonoscopy. normal but suboptimal study due to retained stool.   2010 EGD.  Antral ulcer, hemorrhagic gastritis, large HH with stomach in chest/malrotation.   Past  Medical History  Diagnosis Date  . Hyperlipidemia   . HTN (hypertension)   . Allergic rhinitis   . Headache(784.0)   . Nocturia   . Urinary retention   . Foley catheter in place   . UTI (urinary tract infection)   . GERD (gastroesophageal reflux disease)   . Prediabetes   . Depression   . Arthritis   . Bell's palsy     Past Surgical History  Procedure Laterality Date  . Hernia repair      inguinal /left  . Nissen fundoplication    . Laparoscopic lysis intestinal adhesions    . Transurethral resection of prostate N/A 03/20/2013    Procedure: TRANSURETHRAL RESECTION OF THE PROSTATE WITH GYRUS INSTRUMENTS;  Surgeon: Molli Hazard, MD;  Location: [facility];  Service: Urology;  Laterality: N/A;  . Carpal tunnel release      left hand  . Nerve surgery      left arm  . Laparotomy N/A 06/25/2014    Procedure: EXPLORATORY LAPAROTOMY ;  Surgeon: Gayland Curry, MD;  Location: [facility];  Service: General;  Laterality: N/A;    Prior to Admission medications   Medication Sig Start Date End Date Taking? Authorizing Provider  atenolol (TENORMIN) 100 MG tablet Take 1 tablet (100 mg total) by mouth daily. 07/29/13 07/29/14 Yes Unk Pinto, MD  atorvastatin (LIPITOR) 80 MG tablet Take 1 tablet (80 mg total) by mouth 3 (three) times a week. Patient taking differently: Take 40-80 mg by mouth 3 (three) times a week.  07/29/13  Yes Unk Pinto, MD  finasteride (PROSCAR) 5 MG tablet Take 5 mg by mouth daily.  07/30/13  Yes Historical Provider, MD  hyoscyamine (LEVSIN, ANASPAZ) 0.125 MG tablet Take 0.125 mg by mouth every 4 (four) hours as needed for bladder spasms.   Yes Historical Provider, MD  meloxicam (MOBIC) 15 MG tablet 1/2 to 1 tablet daily with food for arthritis pain and inflammation Patient taking differently: Take 7.5-15 mg by mouth daily. Take  with food for arthritis pain and inflammation 08/07/13  Yes Unk Pinto, MD  metoCLOPramide (REGLAN) 10 MG tablet Take 1 tablet (10 mg  total) by mouth 4 (four) times daily -  before meals and at bedtime. 05/20/14  Yes Inda Castle, MD  omeprazole (PRILOSEC OTC) 20 MG tablet Take 20 mg by mouth daily.   Yes Historical Provider, MD  omeprazole (PRILOSEC) 40 MG capsule Take 1 capsule (40 mg total) by mouth daily. 05/22/14  Yes Vicie Mutters, PA-C  oxyCODONE-acetaminophen (PERCOCET/ROXICET) 5-325 MG per tablet Take 1-2 tablets by mouth every 4 (four) hours as needed for severe pain.   Yes Historical Provider, MD  tamsulosin (FLOMAX) 0.4 MG CAPS capsule Take 0.4 mg by mouth at bedtime.    Yes Historical Provider, MD  B Complex-C (SUPER B COMPLEX PO) Take 1 tablet by mouth every morning.    Historical Provider, MD  Cholecalciferol (VITAMIN D PO) Take 2,000 Int'l Units by mouth daily.    Historical Provider, MD  cyclobenzaprine (FLEXERIL) 10 MG tablet Take 10 mg by mouth 3 (  three) times daily as needed for muscle spasms.    Historical Provider, MD  Magnesium 500 MG TABS Take 1 tablet by mouth daily.    Historical Provider, MD  Multiple Vitamin (MULTIVITAMIN WITH MINERALS) TABS tablet Take 1 tablet by mouth every morning.     Historical Provider, MD  omega-3 acid ethyl esters (LOVAZA) 1 G capsule Take by mouth 2 (two) times daily.    Historical Provider, MD    Scheduled Meds: . sodium chloride   Intravenous Once  . sodium chloride   Intravenous Once  . antiseptic oral rinse  7 mL Mouth Rinse QID  . chlorhexidine  15 mL Mouth Rinse BID  . imipenem-cilastatin  250 mg Intravenous Q6H  . pantoprazole (PROTONIX) IV  40 mg Intravenous QHS   Infusions: . fentaNYL infusion INTRAVENOUS 100 mcg/hr (06/23/14 0826)  . norepinephrine (LEVOPHED) Adult infusion 18 mcg/min (06/23/14 1200)  .  sodium bicarbonate  infusion 1000 mL 175 mL/hr at 06/23/14 0849  . vasopressin (PITRESSIN) infusion - *FOR SHOCK* 0.03 Units/min (06/23/14 0600)   PRN Meds: sodium chloride, fentaNYL   Allergies as of 07/04/2014 - Review Complete 07/07/2014  Allergen  Reaction Noted  . Chloraseptic sore throat [acetaminophen] Other (See Comments) 04/17/2014  . Milk-related compounds Other (See Comments) 04/17/2014    Family History  Problem Relation Age of Onset  . Colon cancer Mother   . Prostate cancer Father   . Arthritis Brother   . Fibromyalgia Brother     History   Social History  . Marital Status: Widowed    Spouse Name: N/A    Number of Children: N/A  . Years of Education: N/A   Occupational History  . Not on file.   Social History Main Topics  . Smoking status: Passive Smoke Exposure - Never Smoker -- 34 years  . Smokeless tobacco: Former Systems developer    Types: [place] date: 01/03/1978     Comment: Back in the 70's  . Alcohol Use: No  . Drug Use: No  . Sexual Activity: Not Currently   Other Topics Concern  . Not on file   Social History Narrative    REVIEW OF SYSTEMS: Constitutional:  Falls at home PTA.  ENT:  No nose bleeds Pulm:  No breathing problems CV:  No palpitations, no LE edema, no chest pain.  GU:  No hematuria, no frequency GI:  Per HPI Heme:  No issues with bleeding or low blood counts   Transfusions:  None in records Neuro:  No headaches, no peripheral tingling or numbness Derm:  No itching, no rash or sores.  Endocrine:  No sweats or chills.  No polyuria or dysuria Immunization:  Reviewed and multiple vaccines up to date, including flu and pnvx in 01/2014.  Travel:  None beyond local counties in last few months.    PHYSICAL EXAM: Vital signs in last 24 hours: Filed Vitals:   06/23/14 1200  BP:   Pulse: 83  Temp: 100.4 F (38 C)  Resp:    Wt Readings from Last 3 Encounters:  06/20/2014 208 lb 12.4 oz (94.7 kg)  06/20/14 197 lb 2 oz (89.415 kg)  05/22/14 209 lb (94.802 kg)   General: comfortable on went, pale but not toxic appearing.   Head:  No swelling or asymmetry  Eyes:  Slight icteric sclera.  No conj pallor Ears:  Not obviously hearing impaired  Nose:  No congestion or  discharge Mouth:  Intubated, no blood per os.  Neck:  No mass, no JVD.   Lungs:  No resp distress, breathing is quiet/unlabored on vent.  Heart: RRR.  No MRG Abdomen:  Soft, NT, quiet, slightly tense.  Large lap bandage is c/d/i, was not removed for exam.  .   Rectal: deferrred GU: amber colored urine in foley   Musc/Skeltl: no joint swelling or pain Extremities:  Cool feet with some mottling, mottling minor on arms as well.    Neurologic:  Follows commands, alert, appropriate responses to questions and requests.  Skin/Derm:  Severe onychomycosis on toenails. Mottling as above. No telangectasia Tattoos:  none   Psych:  Not agitated.  Quite relaxed on vent but "mittens" in place.   Intake/Output from previous day: 02/07 0701 - 02/08 0700 In: 8852.8 [I.V.:6651.8; Blood:615; IV Piggyback:1586] Out: 4196 [Urine:1317] Intake/Output this shift: Total I/O In: 1400.1 [I.V.:1140.1; IV Piggyback:260] Out: 185 [Urine:185]  LAB RESULTS:  Recent Labs  06/18/2014 1000 07/09/2014 1400 06/23/14 0500  WBC 10.0 6.3 14.1*  HGB 11.2* 10.5* 10.7*  HCT 32.4* 31.0* 31.1*  PLT 56*  54* 46* 62*   BMET Lab Results  Component Value Date   NA 141 06/23/2014   NA 141 06/16/2014   NA 142 06/22/2014   K 4.7 06/23/2014   K 4.8 07/12/2014   K 5.1 06/30/2014   CL 103 06/23/2014   CL 109 06/20/2014   CL 109 07/02/2014   CO2 12* 06/23/2014   CO2 10* 07/01/2014   CO2 12* 06/20/2014   GLUCOSE 98 06/23/2014   GLUCOSE 107* 07/06/2014   GLUCOSE 101* 06/24/2014   BUN 47* 06/23/2014   BUN 44* 06/17/2014   BUN 42* 06/28/2014   CREATININE 3.98* 06/23/2014   CREATININE 3.51* 06/25/2014   CREATININE 3.27* 07/07/2014   CALCIUM 5.3* 06/23/2014   CALCIUM 5.3* 07/08/2014   CALCIUM 5.5* 07/10/2014   LFT  Recent Labs  07/09/2014 1445 07/08/2014 2222 07/05/2014 0310 06/17/2014 2055 06/23/14 0500  PROT 4.2* 3.4* 4.0*  --   --   ALBUMIN 2.5* 1.8* 2.2* 1.9* 1.8*  AST 191* 175* 182*  --   --   ALT 228* 163*  154*  --   --   ALKPHOS 81 70 69  --   --   BILITOT 4.4* 3.6* 3.9*  --   --   BILIDIR  --   --  2.9*  --   --   IBILI  --   --  1.0*  --   --    PT/INR Lab Results  Component Value Date   INR 1.91* 06/23/2014   INR 2.11* 06/20/2014   INR 2.11* 07/02/2014   Hepatitis Panel No results for input(s): HEPBSAG, HCVAB, HEPAIGM, HEPBIGM in the last 72 hours. C-Diff No components found for: CDIFF Lipase     Component Value Date/Time   LIPASE 265* 06/28/2014 1000    Drugs of Abuse  No results found for: LABOPIA, COCAINSCRNUR, LABBENZ, AMPHETMU, THCU, LABBARB   RADIOLOGY STUDIES: US Renal  06/24/2014   CLINICAL DATA:  Acute renal failure.  Hydronephrosis  EXAM: RENAL/URINARY TRACT ULTRASOUND COMPLETE  COMPARISON:  CT 06/20/2014  FINDINGS: Right Kidney:  Length: 8.6 cm. 13 mm upper pole cyst. Negative for hydronephrosis. Renal cortex normal in echogenicity.  Left Kidney:  Length: 9.7 cm. 12 mm upper pole cyst. Echogenicity within normal limits. No mass or hydronephrosis visualized.  Bladder:  Urinary bladder is empty with a Foley catheter.  Small amount of free fluid in the abdomen.  IMPRESSION: Small right kidney.  Negative  for hydronephrosis.   Electronically Signed   By: Franchot Gallo M.D.   On: 06/17/2014 18:54   Dg Chest Port 1 View  07/10/2014   CLINICAL DATA:  Respiratory failure  EXAM: PORTABLE CHEST - 1 VIEW  COMPARISON:  07/13/2014  FINDINGS: Mild left basilar atelectasis. No focal consolidation. No pleural effusion or pneumothorax.  Endotracheal tube terminates 4.5 cm above the carina.  Left IJ venous catheter terminates in the mid SVC.  Enteric tube terminates in the gastric cardia.  IMPRESSION: No evidence of acute cardiopulmonary disease.  Endotracheal tube terminates 4.5 cm above the carina.  Additional support apparatus as above.   Electronically Signed   By: Julian Hy M.D.   On: 07/02/2014 09:52   Dg Chest Portable 1 View  07/04/2014   CLINICAL DATA:  Post intubation and  central line placement.  EXAM: PORTABLE CHEST - 1 VIEW  COMPARISON:  CT chest and chest radiograph 07/13/2014.  FINDINGS: Endotracheal tube terminates approximately 2.2 cm above the carina. Left IJ central line tip projects over the upper SVC. Heart size stable. There may be minimal subsegmental atelectasis at the lung bases. Lungs are low in volume. No pleural fluid. No pneumothorax.  IMPRESSION: 1. Endotracheal tube terminates approximately 2.2 cm above the carina. Retracting approximately 1 cm would better position the tip above the carina. 2. Left IJ central line placement without pneumothorax.   Electronically Signed   By: Lorin Picket M.D.   On: 07/03/2014 18:39   Dg Chest Portable 1 View  07/04/2014   CLINICAL DATA:  Trauma patient. Loss of consciousness following 2x earlier today. Patient now complaining of abdominal, back and bilateral leg pain.  EXAM: PORTABLE CHEST - 1 VIEW  COMPARISON:  03/18/2013  FINDINGS: Apparent enlargement of the cardiac silhouette and mediastinal contours, in particular, there is apparent enlargement of the ascending and descending thoracic aorta. No focal airspace opacities. No pleural effusion or pneumothorax. No evidence of edema. No acute osseus abnormalities.  IMPRESSION: Apparent enlargement of the cardiac silhouette and mediastinal contours, possibly accentuated due to decreased lung volumes and patient rotation though a thoracic aortic aneurysm and/or dissection could have a similar appearance. Further evaluation could be performed with PA and lateral chest radiograph and/or chest CTA as clinically indicated.  Critical Value/emergent results were called by telephone at the time of interpretation on 07/11/2014 at 3:13 pm to Dr. Debby Freiberg , who verbally acknowledged these results.   Electronically Signed   By: Sandi Mariscal M.D.   On: 07/04/2014 15:15   Dg Abd Portable 1v  07/04/2014   CLINICAL DATA:  Evaluate nasogastric tube placement.  EXAM: PORTABLE ABDOMEN - 1  VIEW  COMPARISON:  Chest radiograph of 1 day prior  FINDINGS: Motion degraded portable supine view. Nasogastric tube is looped in the stomach, with tip at proximal gastric body. Gas within the colon. No definite small bowel distention. The right side of the abdomen is excluded.  IMPRESSION: Nasogastric tube looped in the stomach, with tip at proximal stomach.   Electronically Signed   By: Abigail Miyamoto M.D.   On: 07/02/2014 09:51   Ct Angio Chest Aorta W/cm &/or Wo/cm Ct Cta Abd/pel W/cm &/or W/o Cm 06/25/2014   CLINICAL DATA:  At least 2 unwitnessed falls at home earlier today. Decreasing a blood pressure while in the emergency department. Acute onset abdominal pain, lumbar back pain and bilateral lower extremity pain. Surgical history includes Nissen fundoplication, light cysts of intestinal adhesions, and TURP.  EXAM: CT  ANGIOGRAPHY CHEST, ABDOMEN AND PELVIS  TECHNIQUE: Multidetector CT imaging through the chest, abdomen and pelvis was performed using the standard protocol before and during bolus administration of intravenous contrast. Multiplanar reconstructed images and MIPs were obtained and reviewed to evaluate the vascular anatomy.  CONTRAST:  100 ml Omnipaque 350 IV.  It is noted that the patient has an elevated BUN of 48 and creatinine of 3.90. However, the emergency department physician felt that the current symptoms warranted the use of intravenous iodinated contrast in order to exclude an aortic dissection.  COMPARISON:  CT abdomen and pelvis 02/13/2013, 01/03/2013, 04/28/2008. CT chest 04/28/2008.  FINDINGS: CTA CHEST FINDINGS  Respiratory motion blurs images throughout the examination of the chest, rendering the examination less than optimal. Unenhanced images demonstrate no evidence of mural hematoma involving the thoracic aorta. Enhanced images demonstrate no evidence of dissection or aneurysm. Mild atherosclerosis is present involving the descending thoracic aorta. Proximal great vessels widely  patent.  Heart size normal, though there may be mild left atrial enlargement. No visible coronary atherosclerosis. No pericardial effusion. Enlarged main pulmonary arteries, the right measuring up to 3.5 cm approximately diameter and the left up to approximately 3.0 cm diameter. No convincing evidence of central pulmonary embolism, though the timing of contrast enhancement was not optimal for visualization of the pulmonary arteries.  Emphysematous changes throughout both lungs. Pulmonary parenchyma clear without localized airspace consolidation, interstitial disease, or parenchymal nodules or masses. No pleural effusions. Central airways patent.  No significant mediastinal, hilar or axillary lymphadenopathy. Thyroid gland normal in size containing numerous small nodules, the largest approximating 8 mm. Fluid throughout the esophagus. Small hiatal hernia which will be discussed below.  Bone window images demonstrate degenerative disc disease and spondylosis involving the lower cervical spine with sclerosis involving the C7 vertebral body. No sclerotic vertebrae elsewhere in the visualized lower cervical were thoracic spine. Mild osseous demineralization is suspected.  Review of the MIP images confirms the above findings.  CTA ABDOMEN AND PELVIS FINDINGS  Respiratory motion blurs many of the images of the abdomen, particularly the upper abdomen, rendering the examination less than optimal. Unenhanced images demonstrate no evidence of mural thrombus involving the upper abdominal aorta. Enhanced images demonstrate mild atherosclerosis involving the distal abdominal aorta without evidence of aneurysm or dissection. Mild left common iliac atherosclerosis. Widely patent celiac artery, SMA, and IMA. Widely patent single renal arteries bilaterally.  There is a round collection of contrast adjacent to or involving the wall of the distal esophagus or gastric cardia at the EG junction, best seen on the coronal reformatted  images, measuring approximately 1.9 x 1.3 cm. This is immediately adjacent to surgical clips placed at the time of the prior Nissen procedure. Small to moderate sized recurrent hiatal hernia. Fluid-filled stomach which is not distended.  Severe diffuse hepatic steatosis. The liver is suboptimally imaged due to the respiratory motion, but no focal parenchymal abnormality is identified. Similarly, the spleen is unremarkable. I suspect diffuse enlargement of the pancreas and associated peripancreatic edema/inflammation. There is also edema/inflammation surrounding the descending portion of the duodenum.  Normal adrenal glands. Hydroureteronephrosis involving both kidneys without evidence of obstructing stone or mass. Severe scarring involving both kidneys. Patchy enhancement the kidneys consistent with the renal insufficiency. Cortical cysts involving both kidneys. No visible solid renal masses. No significant lymphadenopathy.  The remainder the small bowel is unremarkable. Moderate stool burden in the ascending and transverse colon, with borderline gaseous distension of the transverse colon. Descending colon, sigmoid colon and rectum  relatively decompressed. Wall thickening over a several cm segment of the distal rectum to the anal verge. No free intraperitoneal air. Small amount of ascites dependently in the pelvis.  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 No visible masses at either inferior orifice to account for the bilateral hydronephrosis. 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 Phleboliths low in both sides of the pelvis.  Bone window images demonstrate degenerative disc disease and spondylosis at L1-2 and L2-3, facet degenerative changes at L4-5 and L5-S1, and bone islands in the right iliac bone.  Review of the MIP images confirms the above findings.  IMPRESSION: 1. The examination was significantly degraded by respiratory motion. 2. No evidence of thoracic or abdominal aortic  aneurysm or dissection. 3. 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 4. Acute pancreatitis is suspected. 5. Acute duodenitis is also suspected. This may be secondary to the presumed pancreatitis. 6. Proctitis involving the distal rectum to the level of the anal verge. This likely explains the bright red blood per rectum. 7. Severe diffuse hepatic steatosis. 8. Bilateral hydronephrosis without obstructing stone or mass. As there is evidence of a diverticulum arising from the right lateral wall of the bladder, the hydronephrosis could be secondary to neurogenic bladder. Clinical correlation. 9. Scarring diffusely throughout both kidneys. 10. Widely patent visceral arteries. 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  No acute cardiopulmonary disease. 12. Enlarged main pulmonary arteries, measured above, consistent with pulmonary arterial hypertension. 13. Multiple subcentimeter nodules within a normal sized thyroid gland. This does not require further imaging followup. This follows ACR consensus guidelines: Managing Incidental Thyroid Nodules Detected on Imaging: White Paper of the ACR Incidental Thyroid Findings Committee. J Am Coll Radiol 2015; 12:143-150.  These results were discussed directly by telephone with Dr. Colin Rhein of the emergency department at 1625 hr and 1630 hr. She   Electronically Signed   By: Evangeline Dakin M.D.   On: 06/18/2014 16:46      IMPRESSION:   *  Acute pancreatitis.  ? Ischemic etiology.  ? Biliary etiology.   Acute on chronic (dates to at least 2009) abdominal pain EGD and colonoscopy 04/2014: duodenitis, gastritis, retained gastric contents.  Adenomatous colon polyps. Duodenitis on CT scan, may be secondary to the pancreatitis.   *  S/p 2/7 ex lap for suspicion of ischemic bowel.  Bowel unremarkable, surgeon did sew small tear in SB mesentery and lyse a few  adhesions.  *  Gastroparesis.  Slow emptying on nuc med GES study 05/13/14.    *  Multi-organ failure.  In setting of sepsis/shock. On pressors.   *  Blood per rectum. CT scan shows proctitis.  No proctitis on colonoscopy 04/22/2014.  Suspect ischemic.   *  Possible pseudoaneurysm of left gastric artery branch.  *  Hepatic steatosis.  *  Bilateral hydronephrosis without obstructing stone or mass.  *  Normocytic anemia.  *  Thrombocytopenia.  *  Coagulopathy. DIC.    *  Nissen fundoplication 2703.   PLAN:      *  Ok to initiate tube feeds, if ok with surgery. Start low and increase rate slowly given hx of gastroparesis.   *  Dr Olevia Perches will be seeing pt later today.    Azucena Freed  06/23/2014, 1:30 PM Pager: [phone].  Attending MD note:   I have taken a history, examined the patient, and reviewed the  chart.and   abdominal ultrasound from 06/02/2014 with respect to possible chronic/acute cholecystitis. I have also reviewed Dr Dois Davenport op note as to possible gall bladder abnormality ( he does not comment on the gall bladder).   Pt currently hypotensive, with increased lactic acid levels and skin mottling. I have asked Dr Oneida Alar , vascular surgeon to assess for peripheral vascular ischemia as possible cause of sepsis.Adbominal exam : Very tender but bowl sounds active,there is anincreased tenderness in RUQ I feel that biliary pancreatitis is a possibility as the initial cause of acute illness but his condition has progressed at this point to secondary complications. I don't see any bile duct stone or intrahepatic abscess. We will proceed with bedside abd ulltrasound in am to follow up on contracted gall bladder from 06/02/2014.and pancreatic edema. Consider MRI pancreatic protocol to r/o neccrotizing pancreatitis .  [facility] Pager # [phone]

## 2014-06-23 NOTE — Progress Notes (Signed)
CRITICAL VALUE ALERT  Critical value received:  Lactic 18  Date of notification:  06/23/14  Time of notification:  4098  Critical value read back:Yes.    Nurse who received alert:  Rosine Door  MD notified (1st page):  Dr. Nelda Marseille  Time of first page:  1734  MD notified (2nd page):  Time of second page:  Responding MD:  Dr. Nelda Marseille  Time MD responded:  [phone]

## 2014-06-23 NOTE — Consult Note (Addendum)
[facility] [place] HISTORY AND PHYSICAL  Reason for consult: rule out lower extremity ischemia Requesting: Ramaswamy critical care History of Present Illness:  Patient is a 64 y.o. year old male who presents for evaluation of bilateral lower extremity ischemia.  Pt admitted 2 days ago after syncopal episode.  He had been undergoing GI workup prior to admission for abdominal pain and wt loss.  Pt underwent ex lap 48 hrs ago and had negative laparotomy.  This was done for abdominal pain in the presence of rising lactate.  Pt had no evidence of mesenteric occlusive disease on CTA preop.  His bowel was noted to be normal in appearance at lapartomy.  Pt has continued to require more pressor support with current diagnosis of sepsis of unknown source.  Also concern for pancreatitis on preop CT scan.  Pancreas noted to be firm on palpation at lapartomy.  Pt now has progressive renal failure, respiratory failure, shock liver.  Other medical problems include hyperlipidemia, hypertension, hydronephrosis (chronic), reflux all of these appear to be stable.  Pt was noted to have dusky toes over the last several hours so I was called to evaluate legs as a possible source of ischemia and sepsis.  Past Medical History  Diagnosis Date  . Hyperlipidemia   . HTN (hypertension)   . Allergic rhinitis   . Headache(784.0)   . Nocturia   . Urinary retention   . Foley catheter in place   . UTI (urinary tract infection)   . GERD (gastroesophageal reflux disease)   . Prediabetes   . Depression   . Arthritis   . Bell's palsy     Past Surgical History  Procedure Laterality Date  . Hernia repair      inguinal /left  . Nissen fundoplication    . Laparoscopic lysis intestinal adhesions    . Transurethral resection of prostate N/A 03/20/2013    Procedure: TRANSURETHRAL RESECTION OF THE PROSTATE WITH GYRUS INSTRUMENTS;  Surgeon: Molli Hazard, MD;  Location: [facility] ORS;  Service: Urology;   Laterality: N/A;  . Carpal tunnel release      left hand  . Nerve surgery      left arm  . Laparotomy N/A 07/07/2014    Procedure: EXPLORATORY LAPAROTOMY ;  Surgeon: Gayland Curry, MD;  Location: [facility] OR;  Service: General;  Laterality: N/A;    Social History History  Substance Use Topics  . Smoking status: Passive Smoke Exposure - Never Smoker -- 34 years  . Smokeless tobacco: Former Systems developer    Types: [place] date: 01/03/1978     Comment: Back in the 70's  . Alcohol Use: No    Family History Family History  Problem Relation Age of Onset  . Colon cancer Mother   . Prostate cancer Father   . Arthritis Brother   . Fibromyalgia Brother     Allergies  Allergies  Allergen Reactions  . Chloraseptic Sore Throat [Acetaminophen] Other (See Comments)    Spray burnt mouth!  . Milk-Related Compounds Other (See Comments)    Causes gas and diarrhea     Current Facility-Administered Medications  Medication Dose Route Frequency Provider Last Rate Last Dose  . 0.9 %  sodium chloride infusion  250 mL Intravenous PRN Brand Males, MD      . 0.9 %  sodium chloride infusion   Intravenous Once Nickie Retort, MD      . 0.9 %  sodium chloride infusion   Intravenous  Once Gayland Curry, MD      . antiseptic oral rinse (CPC / CETYLPYRIDINIUM CHLORIDE 0.05%) solution 7 mL  7 mL Mouth Rinse QID Brand Males, MD   7 mL at 06/23/14 1600  . chlorhexidine (PERIDEX) 0.12 % solution 15 mL  15 mL Mouth Rinse BID Brand Males, MD   15 mL at 06/23/14 0804  . fentaNYL (SUBLIMAZE) 2,500 mcg in sodium chloride 0.9 % 250 mL (10 mcg/mL) infusion  25-400 mcg/hr Intravenous Continuous Brand Males, MD   Stopped at 06/23/14 1739  . fentaNYL (SUBLIMAZE) bolus via infusion 50 mcg  50 mcg Intravenous Q1H PRN Brand Males, MD      . imipenem-cilastatin (PRIMAXIN) 250 mg in sodium chloride 0.9 % 100 mL IVPB  250 mg Intravenous Q6H Rande Lawman Rumbarger, RPH   250 mg at 06/23/14 1354  .  norepinephrine (LEVOPHED) 16 mg in dextrose 5 % 250 mL (0.064 mg/mL) infusion  2-50 mcg/min Intravenous Continuous Brand Males, MD 32.8 mL/hr at 06/23/14 1722 35 mcg/min at 06/23/14 1722  . pantoprazole (PROTONIX) injection 40 mg  40 mg Intravenous QHS Brand Males, MD   40 mg at 07/07/2014 2225  . sodium bicarbonate 150 mEq in dextrose 5 % 1,000 mL infusion   Intravenous Continuous Mauri Brooklyn, MD 175 mL/hr at 06/23/14 1530    . vasopressin (PITRESSIN) 40 Units in sodium chloride 0.9 % 250 mL (0.16 Units/mL) infusion  0.03 Units/min Intravenous Continuous Mauri Brooklyn, MD 11.3 mL/hr at 06/23/14 0600 0.03 Units/min at 06/23/14 0600    ROS:   Unable to obtain pt on ventilator  Physical Examination  Filed Vitals:   06/23/14 1600 06/23/14 1617 06/23/14 1700 06/23/14 1800  BP: 84/42 83/42 84/55  82/46  Pulse: 80 79 81 82  Temp: 101.1 F (38.4 C)  101.4 F (38.6 C) 101.4 F (38.6 C)  TempSrc:      Resp:      Height:      Weight:      SpO2: 100% 100% 100% 100%    Body mass index is 28.31 kg/(m^2).  General:  Alert follows commands Cardiac: Regular Rate sinu Rhythm on monitor Abdomen: Soft,tender to palpation open midline wound with pale granulation tissue/fat Skin: No rash, toes uniformly dusky bilaterally, fingers more pink with cap refill no splinter hemorrhage Extremity Pulses:  Symmetric cuff and aline pressures left and right arm, 1+ femoral absent dorsalis pedis, posterior tibial pulses bilaterally, has brisk biphasic PT doppler bilaterally, no AT peroneal or DP doppler signal Musculoskeletal: No deformity trace edema feet and hands  Neurologic: Upper and lower extremity motor he does move all four  DATA:  CTA chest abd pelvis images reviewed from Feb 6 ,mesenterics aorta iliacs common femoral profunda SFA origin all patent with no significant calcification or obstructive disease.  CBC    Component Value Date/Time   WBC 14.1* 06/23/2014 0500   WBC 6.8 02/07/2013 1059    RBC 3.30* 06/23/2014 0500   RBC 4.60 02/07/2013 1059   HGB 10.7* 06/23/2014 0500   HGB 14.0 02/07/2013 1059   HCT 31.1* 06/23/2014 0500   HCT 41.5 02/07/2013 1059   PLT 62* 06/23/2014 0500   PLT 129* 02/07/2013 1059   MCV 94.2 06/23/2014 0500   MCV 90.1 02/07/2013 1059   MCH 32.4 06/23/2014 0500   MCH 30.4 02/07/2013 1059   MCHC 34.4 06/23/2014 0500   MCHC 33.8 02/07/2013 1059   RDW 16.8* 06/23/2014 0500   RDW 13.2 02/07/2013 1059   LYMPHSABS 0.7  06/23/2014 0500   LYMPHSABS 2.0 02/07/2013 1059   MONOABS 1.7* 06/23/2014 0500   MONOABS 0.6 02/07/2013 1059   EOSABS 0.0 06/23/2014 0500   EOSABS 0.2 02/07/2013 1059   BASOSABS 0.0 06/23/2014 0500   BASOSABS 0.0 02/07/2013 1059     BMET    Component Value Date/Time   NA 141 06/23/2014 0500   NA 143 02/07/2013 1059   K 4.7 06/23/2014 0500   K 4.3 02/07/2013 1059   CL 103 06/23/2014 0500   CO2 12* 06/23/2014 0500   CO2 29 02/07/2013 1059   GLUCOSE 98 06/23/2014 0500   GLUCOSE 97 02/07/2013 1059   BUN 47* 06/23/2014 0500   BUN 22.1 02/07/2013 1059   CREATININE 3.98* 06/23/2014 0500   CREATININE 1.58* 05/22/2014 1401   CREATININE 1.6* 02/07/2013 1059   CALCIUM 5.3* 06/23/2014 0500   CALCIUM 9.9 02/07/2013 1059   GFRNONAA 14* 06/23/2014 0500   GFRNONAA 45* 05/22/2014 1401   GFRAA 17* 06/23/2014 0500   GFRAA 52* 05/22/2014 1401    ASSESSMENT:  Rising lactate progressive shock unknown source could still be related to pancreas.  Although feet are dusky he has brisk biphasic doppler signals bilaterally which should be adequate perfusion,  Muscle compartments are soft and not suggestive of compartment syndrome.  No real edema to suggest DVT although possible but he would need massive venous clot burden to account for symptoms which does not appear to be case.  Potentially delay in clearing his lactate from renal dysfunction?  No obvious arterial or venous explanation for his refractory shock.  Only source that has not been  imaged is head but this as source of sepsis without neuro findings would probably be low yield.   PLAN:  No real explanation from vascular standpoint.  If continued refractory shock could consider repeat CT abdomen.  Will sign off.  Please reconsult if overall picture changes.  Wean pressors if tolerated to improve perfusion.  Ruta Hinds, MD [facility] [place] Office: [phone] Pager: [phone]

## 2014-06-24 ENCOUNTER — Inpatient Hospital Stay (HOSPITAL_COMMUNITY): Payer: Commercial Managed Care - HMO

## 2014-06-24 DIAGNOSIS — J9600 Acute respiratory failure, unspecified whether with hypoxia or hypercapnia: Secondary | ICD-10-CM

## 2014-06-24 DIAGNOSIS — E872 Acidosis: Secondary | ICD-10-CM

## 2014-06-24 DIAGNOSIS — R579 Shock, unspecified: Secondary | ICD-10-CM

## 2014-06-24 LAB — RENAL FUNCTION PANEL
Albumin: 1.5 g/dL — ABNORMAL LOW (ref 3.5–5.2)
Anion gap: 31 — ABNORMAL HIGH (ref 5–15)
BUN: 61 mg/dL — ABNORMAL HIGH (ref 6–23)
CO2: 12 mmol/L — AB (ref 19–32)
Calcium: 5 mg/dL — CL (ref 8.4–10.5)
Chloride: 94 mmol/L — ABNORMAL LOW (ref 96–112)
Creatinine, Ser: 4.92 mg/dL — ABNORMAL HIGH (ref 0.50–1.35)
GFR calc non Af Amer: 11 mL/min — ABNORMAL LOW (ref >90)
GFR, EST AFRICAN AMERICAN: 13 mL/min — AB (ref >90)
GLUCOSE: 101 mg/dL — AB (ref 70–99)
Phosphorus: 7.4 mg/dL — ABNORMAL HIGH (ref 2.3–4.6)
Potassium: 5.3 mmol/L — ABNORMAL HIGH (ref 3.5–5.1)
Sodium: 137 mmol/L (ref 135–145)

## 2014-06-24 LAB — COMPREHENSIVE METABOLIC PANEL
ALK PHOS: 100 U/L (ref 39–117)
ALT: 128 U/L — ABNORMAL HIGH (ref 0–53)
AST: 177 U/L — ABNORMAL HIGH (ref 0–37)
Albumin: 1.6 g/dL — ABNORMAL LOW (ref 3.5–5.2)
Anion gap: 32 — ABNORMAL HIGH (ref 5–15)
BUN: 56 mg/dL — ABNORMAL HIGH (ref 6–23)
CHLORIDE: 95 mmol/L — AB (ref 96–112)
CO2: 13 mmol/L — ABNORMAL LOW (ref 19–32)
Calcium: 4.6 mg/dL — CL (ref 8.4–10.5)
Creatinine, Ser: 4.95 mg/dL — ABNORMAL HIGH (ref 0.50–1.35)
GFR calc Af Amer: 13 mL/min — ABNORMAL LOW (ref >90)
GFR calc non Af Amer: 11 mL/min — ABNORMAL LOW (ref >90)
GLUCOSE: 94 mg/dL (ref 70–99)
POTASSIUM: 5.1 mmol/L (ref 3.5–5.1)
SODIUM: 140 mmol/L (ref 135–145)
Total Bilirubin: 5 mg/dL — ABNORMAL HIGH (ref 0.3–1.2)
Total Protein: 3.2 g/dL — ABNORMAL LOW (ref 6.0–8.3)

## 2014-06-24 LAB — BLOOD GAS, ARTERIAL
Acid-base deficit: 11.4 mmol/L — ABNORMAL HIGH (ref 0.0–2.0)
Bicarbonate: 12 mEq/L — ABNORMAL LOW (ref 20.0–24.0)
DRAWN BY: 33100
FIO2: 0.4 %
LHR: 35 {breaths}/min
MECHVT: 650 mL
O2 Saturation: 96.1 %
PEEP/CPAP: 5 cmH2O
PH ART: 7.441 (ref 7.350–7.450)
Patient temperature: 99.3
TCO2: 12.6 mmol/L (ref 0–100)
pCO2 arterial: 18 mmHg — CL (ref 35.0–45.0)
pO2, Arterial: 114 mmHg — ABNORMAL HIGH (ref 80.0–100.0)

## 2014-06-24 LAB — LACTIC ACID, PLASMA
Lactic Acid, Venous: 22.5 mmol/L (ref 0.5–2.0)
Lactic Acid, Venous: 21.4 mmol/L (ref 0.5–2.0)
Lactic Acid, Venous: 21.2 mmol/L (ref 0.5–2.0)
Lactic Acid, Venous: 20.8 mmol/L (ref 0.5–2.0)
Lactic Acid, Venous: 19.2 mmol/L (ref 0.5–2.0)

## 2014-06-24 LAB — CBC WITH DIFFERENTIAL/PLATELET
BASOS ABS: 0 10*3/uL (ref 0.0–0.1)
Basophils Relative: 0 % (ref 0–1)
EOS ABS: 0 10*3/uL (ref 0.0–0.7)
Eosinophils Relative: 0 % (ref 0–5)
HCT: 31 % — ABNORMAL LOW (ref 39.0–52.0)
HEMOGLOBIN: 10.6 g/dL — AB (ref 13.0–17.0)
LYMPHS PCT: 7 % — AB (ref 12–46)
Lymphs Abs: 1.2 10*3/uL (ref 0.7–4.0)
MCH: 32.9 pg (ref 26.0–34.0)
MCHC: 34.2 g/dL (ref 30.0–36.0)
MCV: 96.3 fL (ref 78.0–100.0)
MONOS PCT: 3 % (ref 3–12)
Monocytes Absolute: 0.5 10*3/uL (ref 0.1–1.0)
Neutro Abs: 16.1 10*3/uL — ABNORMAL HIGH (ref 1.7–7.7)
Neutrophils Relative %: 90 % — ABNORMAL HIGH (ref 43–77)
Platelets: 77 10*3/uL — ABNORMAL LOW (ref 150–400)
RBC: 3.22 MIL/uL — ABNORMAL LOW (ref 4.22–5.81)
RDW: 17.2 % — AB (ref 11.5–15.5)
WBC: 17.8 10*3/uL — ABNORMAL HIGH (ref 4.0–10.5)

## 2014-06-24 LAB — BASIC METABOLIC PANEL
Anion gap: 32 — ABNORMAL HIGH (ref 5–15)
BUN: 55 mg/dL — ABNORMAL HIGH (ref 6–23)
CO2: 10 mmol/L — CL (ref 19–32)
Calcium: 4.3 mg/dL — CL (ref 8.4–10.5)
Chloride: 97 mmol/L (ref 96–112)
Creatinine, Ser: 4.77 mg/dL — ABNORMAL HIGH (ref 0.50–1.35)
GFR, EST AFRICAN AMERICAN: 13 mL/min — AB (ref >90)
GFR, EST NON AFRICAN AMERICAN: 12 mL/min — AB (ref >90)
Glucose, Bld: 103 mg/dL — ABNORMAL HIGH (ref 70–99)
Potassium: 4.5 mmol/L (ref 3.5–5.1)
SODIUM: 139 mmol/L (ref 135–145)

## 2014-06-24 LAB — CALCIUM, IONIZED: Calcium, Ion: 0.65 mmol/L — ABNORMAL LOW (ref 1.12–1.32)

## 2014-06-24 LAB — URINE CULTURE
Colony Count: NO GROWTH
Culture: NO GROWTH

## 2014-06-24 LAB — MAGNESIUM: MAGNESIUM: 1.9 mg/dL (ref 1.5–2.5)

## 2014-06-24 LAB — MITOCHONDRIAL ANTIBODIES: Mitochondrial M2 Ab, IgG: 0.19 (ref <0.91)

## 2014-06-24 LAB — HEMOGLOBIN AND HEMATOCRIT, BLOOD
HEMATOCRIT: 29.8 % — AB (ref 39.0–52.0)
Hemoglobin: 10.3 g/dL — ABNORMAL LOW (ref 13.0–17.0)

## 2014-06-24 LAB — ACETAMINOPHEN LEVEL: Acetaminophen (Tylenol), Serum: <10 ug/mL — ABNORMAL LOW (ref 10–30)

## 2014-06-24 LAB — HIV ANTIBODY (ROUTINE TESTING W REFLEX): HIV Screen 4th Generation wRfx: NONREACTIVE

## 2014-06-24 LAB — PROCALCITONIN: Procalcitonin: 8.83 ng/mL

## 2014-06-24 MED ORDER — SODIUM CHLORIDE 0.9 % IV SOLN
1.0000 g | Freq: Once | INTRAVENOUS | Status: AC
  Administered 2014-06-24: 1 g via INTRAVENOUS
  Filled 2014-06-24: qty 10

## 2014-06-24 MED ORDER — SODIUM CHLORIDE 0.9 % IV SOLN
INTRAVENOUS | Status: DC
  Administered 2014-06-24 – 2014-06-25 (×4): via INTRAVENOUS

## 2014-06-24 MED ORDER — SODIUM BICARBONATE 8.4 % IV SOLN
INTRAVENOUS | Status: AC
  Filled 2014-06-24: qty 50

## 2014-06-24 MED ORDER — SODIUM BICARBONATE 8.4 % IV SOLN
50.0000 meq | Freq: Once | INTRAVENOUS | Status: AC
  Administered 2014-06-24: 50 meq via INTRAVENOUS
  Filled 2014-06-24: qty 50

## 2014-06-24 MED ORDER — SODIUM CHLORIDE 0.9 % IV BOLUS (SEPSIS)
4000.0000 mL | Freq: Once | INTRAVENOUS | Status: AC
  Administered 2014-06-24: 4000 mL via INTRAVENOUS

## 2014-06-24 MED ORDER — SODIUM CHLORIDE 0.9 % IV SOLN
250.0000 mg | Freq: Two times a day (BID) | INTRAVENOUS | Status: DC
  Administered 2014-06-24 – 2014-06-25 (×2): 250 mg via INTRAVENOUS
  Filled 2014-06-24 (×3): qty 250

## 2014-06-24 NOTE — Progress Notes (Signed)
PULMONARY / CRITICAL CARE MEDICINE   Name: [name] MRN: [id number] DOB: July 03, 1950   PCP Unk Pinto DAVID, MD [facility] - Dr Alben Spittle [facility] pum - [facility] - last seen may 2012  ADMISSION DATE:  06/25/2014 CONSULTATION DATE:  06/25/2014   REFERRING MD :  [facility] at [facility]  CHIEF COMPLAINT:  Acute abd pain, lactic acidosis 17 . Mottled intubated  BRIEF:  64 year old male  With bp, dm, lipid, pre-dm and vitamin d def, with no family locally (brother in [place]) - hx from ER RN/MD. 5d hx of abdominal pain, weakness and falling. EMS found him hypoglycemic and hypothermic. In ER tachypneic and mottled needing intubation. Never dropped bP but lactic acidosis of 17 despite fluids and without clearance. PCCM Asked to admit. 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 Other findings include emphysema and non obstructive hydronephrosis  2012 eval for dyspnea  - Ex heavy passive smoker. Has chronic dyspnea on exertion with hyooxemia at rest. Dysnea is at class 2-3 level. On walking he does not sem to desaturate ad in fact pulse ox picked up suggesting underlying atelectasis related to raised left diaphragm. . 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 CPST test 05/24/2010 is normal but for mild reduction in HRresponse due to beta blocker and mild elevation in dead space possibly rlated to the left diaprhagm issue. There was no evidence of exercise induced bronchospasm on CPST. He comenced pulmonary rehab in March 2012 andd by May 2012 he had improved significantly with pulmnary rehab. This suggests deconditioning and weight issues and diaphragm as main causes of dyspnea  PAST MEDICAL HISTORY :   has a past medical history of Hyperlipidemia; HTN (hypertension); Allergic rhinitis; Headache(784.0); Nocturia; Urinary retention; Foley catheter in place; UTI (urinary  tract infection); GERD (gastroesophageal reflux disease); Prediabetes; Depression; Arthritis; and Bell's palsy.  has past surgical history that includes Hernia repair; Nissen fundoplication; Laparoscopic lysis intestinal adhesions; Transurethral resection of prostate (N/A, 03/20/2013); Carpal tunnel release; Nerve Surgery; and laparotomy (N/A, 06/20/2014).     SIGNIFICANT EVENTS: 07/01/2014 - admit, intubated and left IJ at [place]  06/25/2014: continued lactic acidosis > 15 -> OR nil acute finding on pLAP other than acute pancreatitis. Significant abd pain + -> improved after fent gtt and patient began to communicate better. Renal failure now - urology thinks is non obst hydro. ATN - from sepsis, pancreatitis, meloxicam and ct dye per renal. On bicarb gtt. On levophed. Coagulopathic as well   STUDIES: Andi-DNA ab 2/9>> Anti-scleroderma ab 2/9>> Anti-smith 2/9>> Anti-smooth muscle ab 2/9>> CCP 2/9>> AMA 2/9>> ANCA 2/9>> RF 2/9>> Sjogrens A ab 2/9>> Sjogrens B ab  2/9>>   SUBJECTIVE/OVERNIGHT/INTERVAL HX 06/24/14: on 60mcg levophed.  Making some urine. Creat stable. Abd pain controlled with fent gtt. Awake - denies complaints. Lactate very mildly improved but elevated to 21.4. S/p ex-lap without obvious source for lactic acidosis apart from pancreatitis.  VITAL SIGNS: Temp:  [99.3 F (37.4 C)-101.4 F (38.6 C)] 99.5 F (37.5 C) (02/09 0600) Pulse Rate:  [61-88] 61 (02/09 0400) Resp:  [35] 35 (02/08 1940) BP: (67-105)/(42-75) 88/47 mmHg (02/09 0600) SpO2:  [95 %-100 %] 100 % (02/09 0400) Arterial Line BP: (83-117)/(43-62) 104/55 mmHg (02/09 0600) FiO2 (%):  [30 %-40 %] 40 % (02/09 0400) Weight:  [209 lb 7 oz (95 kg)] 209 lb 7 oz (95 kg) (02/09 0500) HEMODYNAMICS:  CVP:  [12 mmHg-13 mmHg] 12 mmHg VENTILATOR SETTINGS: Vent Mode:  [-] PRVC FiO2 (%):  [30 %-40 %] 40 % Set Rate:  [35 bmp] 35 bmp Vt Set:  [650 mL] 650 mL PEEP:  [5 cmH20] 5 cmH20 Pressure Support:  [5 cmH20-10 cmH20] 10  cmH20 Plateau Pressure:  [19 cmH20-21 cmH20] 19 cmH20 INTAKE / OUTPUT:  Intake/Output Summary (Last 24 hours) at 06/24/14 9244 Last data filed at 06/24/14 0606  Gross per 24 hour  Intake 6283.32 ml  Output    580 ml  Net 5703.32 ml    PHYSICAL EXAMINATION: General:  Well built male. Intubated. Looks critically ill Neuro: somnolent HEENT:  ET tube + Cardiovascular:  Regular rate and rhythm. Normal heart sounds Lungs:  CTA bilaterally Abdomen:  Soft, No mass. Could not hear bowel sounds. Does not appear tender Musculoskeletal:  No cyanosis, no clubbing,. No edema Skin:  Mottled in forearms and feet, knees look bruised  LABS: PULMONARY  Recent Labs Lab 06/19/2014 1125 07/10/2014 1524 07/05/2014 2040 06/23/14 0521 06/23/14 1617  PHART 7.346* 7.342* 7.310* 7.377 7.393  PCO2ART 19.3* 17.9* 19.4* 19.7* 25.2*  PO2ART 153.0* 85.0 143.0* 104.0* 92.0  HCO3 10.5* 9.7* 9.7* 11.2* 15.3*  TCO2 11 10 10  11.8 16  O2SAT 99.0 96.0 99.0 95.4 97.0    CBC  Recent Labs Lab 06/19/2014 1400 06/23/14 0500 06/24/14 0505  HGB 10.5* 10.7* 10.6*  HCT 31.0* 31.1* 31.0*  WBC 6.3 14.1* 17.8*  PLT 46* 62* 77*    COAGULATION  Recent Labs Lab 06/20/14 1230 07/08/2014 1445 07/04/2014 2222 06/24/2014 1000 06/23/14 0500  INR 1.4* 2.34* 2.57* 2.11*  2.11* 1.91*    CARDIAC    Recent Labs Lab 06/18/2014 2222 06/27/2014 0310 06/21/2014 0630  TROPONINI 0.05* 0.05* 0.05*   No results for input(s): PROBNP in the last 168 hours.   CHEMISTRY  Recent Labs Lab 06/25/2014 2222 06/17/2014 0310  07/09/2014 1400 07/02/2014 2110 06/23/14 0500 06/24/14 0102 06/24/14 0505  NA 141 139  < > 142 141 141 140 137  K 5.4* 5.8*  < > 5.1 4.8 4.7 5.1 5.3*  CL 103 107  < > 109 109 103 95* 94*  CO2 15* 11*  < > 12* 10* 12* 13* 12*  GLUCOSE 415* 154*  < > 101* 107* 98 94 101*  BUN 39* 42*  < > 42* 44* 47* 56* 61*  CREATININE 3.16* 3.42*  < > 3.27* 3.51* 3.98* 4.95* 4.92*  CALCIUM 6.0* 6.2*  < > 5.5* 5.3* 5.3* 4.6*  5.0*  MG 1.5 1.6  --   --   --  1.5  --  1.9  PHOS 7.0* 6.9*  --   --   --  5.6*  --  7.4*  < > = values in this interval not displayed. Estimated Creatinine Clearance: 17.9 mL/min (by C-G formula based on Cr of 4.92).   LIVER  Recent Labs Lab 06/20/14 1230 07/04/2014 1445 06/29/2014 2222 07/07/2014 0310 07/08/2014 1000 07/03/2014 2055 06/23/14 0500 06/24/14 0102 06/24/14 0505  AST 192*  192* 191* 175* 182*  --   --   --  177*  --   ALT 312*  312* 228* 163* 154*  --   --   --  128*  --   ALKPHOS 80  80 81 70 69  --   --   --  100  --   BILITOT 5.3*  5.3* 4.4* 3.6* 3.9*  --   --   --  5.0*  --  PROT 5.3*  5.3* 4.2* 3.4* 4.0*  --   --   --  3.2*  --   ALBUMIN 3.1*  3.1* 2.5* 1.8* 2.2*  --  1.9* 1.8* 1.6* 1.5*  INR 1.4* 2.34* 2.57*  --  2.11*  2.11*  --  1.91*  --   --      INFECTIOUS  Recent Labs Lab 06/28/2014 1000  06/23/14 0500  06/23/14 2000 06/24/14 0102 06/24/14 0505 06/24/14 0506  LATICACIDVEN 15.9*  < > 17.1*  < > >30.00* 22.5*  --  21.4*  PROCALCITON 8.35  --  7.79  --   --   --  8.83  --   < > = values in this interval not displayed.   ENDOCRINE CBG (last 3)   Recent Labs  06/29/2014 1840 06/17/2014 1849 06/18/2014 2326  GLUCAP 98 110* 122*         IMAGING x48h US Renal  07/03/2014   CLINICAL DATA:  Acute renal failure.  Hydronephrosis  EXAM: RENAL/URINARY TRACT ULTRASOUND COMPLETE  COMPARISON:  CT 06/29/2014  FINDINGS: Right Kidney:  Length: 8.6 cm. 13 mm upper pole cyst. Negative for hydronephrosis. Renal cortex normal in echogenicity.  Left Kidney:  Length: 9.7 cm. 12 mm upper pole cyst. Echogenicity within normal limits. No mass or hydronephrosis visualized.  Bladder:  Urinary bladder is empty with a Foley catheter.  Small amount of free fluid in the abdomen.  IMPRESSION: Small right kidney.  Negative for hydronephrosis.   Electronically Signed   By: Franchot Gallo M.D.   On: 07/10/2014 18:54   Dg Chest Port 1 View  06/24/2014   CLINICAL DATA:   Respiratory failure.  EXAM: PORTABLE CHEST - 1 VIEW  COMPARISON:  06/28/2014.  FINDINGS: Endotracheal tube, left IJ line in stable position. NG tube tip appears to be the below the left hemidiaphragm. Slight forward positioning should be considered. Heart size normal. New onset bibasilar atelectasis and/or infiltrates and left pleural effusion noted. Right costophrenic angle not imaged. No acute osseous abnormality.  IMPRESSION: 1. Endotracheal tube and left IJ line in stable position. NG tube appears to be just below the left hemi diaphragm. Slight forward repositioning should be considered. 2. New onset of bibasilar atelectasis and/or infiltrates and left pleural effusion.   Electronically Signed   By: Marcello Moores  Register   On: 06/24/2014 07:27         ASSESSMENT / PLAN:  PULMONARY OETT 06/20/2014 ER A:acute resp failure due to renal failure, lactic acidosis and pain and metabolic demand   P:   PRVC  CARDIOVASCULAR CVL- LEft IJ 06/16/2014  A: Circulatory shock - pancreatitis and sepsis  P:  Hydrate CVP goal >10 with fluids MAP goal > 65 with pressors Await  Echo  RENAL A:   Acute renal failure  - non obst hydro on CT Proffound lactic acidosis - not on metformin, invokana   - acidotic with poor lactate clearance x 48h. Worrise. Unclear why not clearing  P:   Renal consult - conservative mgmt for now -> CRRT if worsens Urology consult - monitor -> renal US -> no hydro Hydrate aggressively Track lactate q4h Foley Trend CK  GASTROINTESTINAL A: #Baseline  - sees Dr Deatra Ina. Gastroparesis +  # Current   Acute  pancreatitis with ? Shock liver   - s/p plap 07/12/2014 - pancreatitis only finding   P:   NPO  CCS input appreciated Follow-up abdominal ultrasound  HEMATOLOGIC A:  Coagulopathic - improved   P:  Monitor  closely  INFECTIOUS Blood  Urine Trach aspirate Follow-up immune studies outlined above  A:  Septic shock based on PCT. No source beyond pancreatitis  indentified P:   Imipenem  ENDOCRINE A:  PRe-DM at baseline   P:   ssi   NEUROLOGIC A:  At risk for delirium   - pain well controlled with fent gtt P:   Fent prn -> gtt NO  diprivan due to acidosis  FAMILY  - Updates:  No family at bedside. Brother in [place]. Patient updated somewhat 06/24/2014   - Inter-disciplinary family meet or Palliative Care meeting due by: 06/28/14    Cordelia Poche, MD PGY-2, [facility] 06/24/2014, 8:17 AM

## 2014-06-24 NOTE — Progress Notes (Signed)
2 Days Post-Op  Subjective: Pt on vent and pressors  Objective: Vital signs in last 24 hours: Temp:  [99.2 F (37.3 C)-101.4 F (38.6 C)] 99.2 F (37.3 C) (02/09 0800) Pulse Rate:  [55-121] 121 (02/09 0831) Resp:  [35] 35 (02/09 0831) BP: (67-106)/(42-75) 106/54 mmHg (02/09 0831) SpO2:  [95 %-100 %] 99 % (02/09 0831) Arterial Line BP: (83-134)/(43-69) 134/69 mmHg (02/09 0800) FiO2 (%):  [30 %-40 %] 40 % (02/09 0831) Weight:  [209 lb 7 oz (95 kg)] 209 lb 7 oz (95 kg) (02/09 0500) Last BM Date:  (pta)  Intake/Output from previous day: 02/08 0701 - 02/09 0700 In: 6959 [I.V.:6299; IV Piggyback:660] Out: 705 [Urine:465; Emesis/NG output:240] Intake/Output this shift: Total I/O In: 174.9 [I.V.:74.9; IV Piggyback:100] Out: 25 [Urine:25]  Incision/Wound:open wound  No rigidity   Wound clean.   Lab Results:   Recent Labs  06/23/14 0500 06/24/14 0505  WBC 14.1* 17.8*  HGB 10.7* 10.6*  HCT 31.1* 31.0*  PLT 62* 77*   BMET  Recent Labs  06/24/14 0102 06/24/14 0505  NA 140 137  K 5.1 5.3*  CL 95* 94*  CO2 13* 12*  GLUCOSE 94 101*  BUN 56* 61*  CREATININE 4.95* 4.92*  CALCIUM 4.6* 5.0*   PT/INR  Recent Labs  06/20/2014 1000 06/23/14 0500  LABPROT 23.8*  23.8* 22.1*  INR 2.11*  2.11* 1.91*   ABG  Recent Labs  06/23/14 0521 06/23/14 1617  PHART 7.377 7.393  HCO3 11.2* 15.3*    Studies/Results: US Renal  07/11/2014   CLINICAL DATA:  Acute renal failure.  Hydronephrosis  EXAM: RENAL/URINARY TRACT ULTRASOUND COMPLETE  COMPARISON:  CT 06/20/2014  FINDINGS: Right Kidney:  Length: 8.6 cm. 13 mm upper pole cyst. Negative for hydronephrosis. Renal cortex normal in echogenicity.  Left Kidney:  Length: 9.7 cm. 12 mm upper pole cyst. Echogenicity within normal limits. No mass or hydronephrosis visualized.  Bladder:  Urinary bladder is empty with a Foley catheter.  Small amount of free fluid in the abdomen.  IMPRESSION: Small right kidney.  Negative for hydronephrosis.    Electronically Signed   By: Franchot Gallo M.D.   On: 07/07/2014 18:54   Dg Chest Port 1 View  06/24/2014   CLINICAL DATA:  Respiratory failure.  EXAM: PORTABLE CHEST - 1 VIEW  COMPARISON:  07/07/2014.  FINDINGS: Endotracheal tube, left IJ line in stable position. NG tube tip appears to be the below the left hemidiaphragm. Slight forward positioning should be considered. Heart size normal. New onset bibasilar atelectasis and/or infiltrates and left pleural effusion noted. Right costophrenic angle not imaged. No acute osseous abnormality.  IMPRESSION: 1. Endotracheal tube and left IJ line in stable position. NG tube appears to be just below the left hemi diaphragm. Slight forward repositioning should be considered. 2. New onset of bibasilar atelectasis and/or infiltrates and left pleural effusion.   Electronically Signed   By: Marcello Moores  Register   On: 06/24/2014 07:27    Anti-infectives: Anti-infectives    Start     Dose/Rate Route Frequency Ordered Stop   06/16/2014 2000  imipenem-cilastatin (PRIMAXIN) 250 mg in sodium chloride 0.9 % 100 mL IVPB     250 mg200 mL/hr over 30 Minutes Intravenous Every 6 hours 06/23/2014 1926     06/29/2014 0130  piperacillin-tazobactam (ZOSYN) IVPB 2.25 g  Status:  Discontinued     2.25 g100 mL/hr over 30 Minutes Intravenous Every 6 hours 06/16/2014 1905 07/09/2014 1921   06/25/2014 1900  piperacillin-tazobactam (ZOSYN) IVPB  3.375 g  Status:  Discontinued     3.375 g100 mL/hr over 30 Minutes Intravenous  Once 07/03/2014 1859 06/22/14 1215   07/10/2014 1800  vancomycin (VANCOCIN) 1,500 mg in sodium chloride 0.9 % 500 mL IVPB     1,500 mg250 mL/hr over 120 Minutes Intravenous  Once 07/09/2014 1649 07/01/2014 2243   07/01/2014 1700  piperacillin-tazobactam (ZOSYN) IVPB 3.375 g     3.375 g100 mL/hr over 30 Minutes Intravenous  Once 06/27/2014 1649 06/18/2014 1752      Assessment/Plan: s/p Procedure(s): EXPLORATORY LAPAROTOMY  (N/A) Etiology of ongoing organ failure unclear Negative ex lap  and CT angiogram that shows an open celiac and SMA  Exclude ischemia Pancreas looks bad on CT and would support pancreatitis as inciting event.  Nothing more we can do surgically at this point but will follow along.   LOS: 3 days    Mackinzee Roszak A. 06/24/2014

## 2014-06-24 NOTE — Progress Notes (Signed)
CRITICAL VALUE ALERT  Critical value received:  Ionized Ca 0.65, Ca 4.6  Date of notification:  06/24/2014  Time of notification:  0215  Critical value read back:Yes.    Nurse who received alert:  Fayrene Helper  MD notified (1st page):  Dr. Jimmy Footman  Time of first page:  0215  MD notified (2nd page):  Time of second page:  Responding MD:  Dr. Jimmy Footman  Time MD responded:  [phone]

## 2014-06-24 NOTE — Progress Notes (Signed)
Admit: 06/30/2014 LOS: 3  67M w/ AKI, normal BL SCr, 2/2 NSAID, CIN, Septic Shock, in setting of [employer] AG 2/2 Lactic Acidosis, ? Acute abdomen (neg ex-lap)  Subjective:  Febrile, now on 3 pressors Hypotensive Persistent acidosis Less UOP, worsened SCr K creeping up  02/08 0701 - 02/09 0700 In: 6959 [I.V.:6299; IV Piggyback:660] Out: 705 [Urine:465; Emesis/NG output:240]  Filed Weights   06/20/2014 2323 06/25/2014 0341 06/24/14 0500  Weight: 94.7 kg (208 lb 12.4 oz) 94.7 kg (208 lb 12.4 oz) 95 kg (209 lb 7 oz)    Scheduled Meds: . sodium chloride   Intravenous Once  . sodium chloride   Intravenous Once  . antiseptic oral rinse  7 mL Mouth Rinse QID  . chlorhexidine  15 mL Mouth Rinse BID  . imipenem-cilastatin  250 mg Intravenous Q6H  . pantoprazole (PROTONIX) IV  40 mg Intravenous QHS   Continuous Infusions: . amiodarone 30 mg/hr (06/24/14 0755)  . fentaNYL infusion INTRAVENOUS Stopped (06/23/14 1739)  . norepinephrine (LEVOPHED) Adult infusion 30 mcg/min (06/24/14 0500)  . phenylephrine (NEO-SYNEPHRINE) Adult infusion 50 mcg/min (06/24/14 0500)  .  sodium bicarbonate  infusion 1000 mL 175 mL/hr at 06/24/14 0425  . vasopressin (PITRESSIN) infusion - *FOR SHOCK* 0.03 Units/min (06/23/14 0600)   PRN Meds:.sodium chloride, fentaNYL  Current Labs: reviewed    Physical Exam:  Blood pressure 106/67, pulse 55, temperature 99.2 F (37.3 C), temperature source Core (Comment), resp. rate 35, height 6' (1.829 m), weight 95 kg (209 lb 7 oz), SpO2 100 %. Intubated, sedated Abd dressed Coarse BS b/l 2+ LEE, pitting RRR, nl s1s2  A/P 1. 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 No outpt nephrogy, apparent nl GFR at baseline 2. Worsened today, less UOP 3. Prognosis quite poor, if plan for full support, will need to start CRRT 4. Will discuss with CCM 2. Hyperkalemia, mild; as per #1 3. Persistent lactic acidosis, ? Related to hypotension / pressors / septic shock 4. Septic Shock on 3  pressors and imipenem 5. VDRF 6. ? Pancreatitis / duodenitis, neg ex-lap 07/03/2014; GI following 7. Nonobstructive chronic hydro 1. Urology following 2. Foley in place 3. Likely from reflux 4. No current issues, foley to remain  Pearson Grippe MD 06/24/2014, 8:23 AM   Recent Labs Lab 06/28/2014 0310  06/23/14 0500 06/24/14 0102 06/24/14 0505  NA 139  < > 141 140 137  K 5.8*  < > 4.7 5.1 5.3*  CL 107  < > 103 95* 94*  CO2 11*  < > 12* 13* 12*  GLUCOSE 154*  < > 98 94 101*  BUN 42*  < > 47* 56* 61*  CREATININE 3.42*  < > 3.98* 4.95* 4.92*  CALCIUM 6.2*  < > 5.3* 4.6* 5.0*  PHOS 6.9*  --  5.6*  --  7.4*  < > = values in this interval not displayed.  Recent Labs Lab 06/24/2014 2222  07/02/2014 1400 06/23/14 0500 06/24/14 0505  WBC 17.4*  < > 6.3 14.1* 17.8*  NEUTROABS 15.0*  --   --  11.7* PENDING  HGB 11.2*  < > 10.5* 10.7* 10.6*  HCT 33.1*  < > 31.0* 31.1* 31.0*  MCV 98.2  < > 97.2 94.2 96.3  PLT 74*  < > 46* 62* 77*  < > = values in this interval not displayed.

## 2014-06-24 NOTE — Progress Notes (Signed)
ANTIBIOTIC CONSULT NOTE - FOLLOW UP  Pharmacy Consult for Primaxin Indication: pancreatitis  Allergies  Allergen Reactions  . Chloraseptic Sore Throat [Acetaminophen] Other (See Comments)    Spray burnt mouth!  . Milk-Related Compounds Other (See Comments)    Causes gas and diarrhea    Patient Measurements: Height: 6' (182.9 cm) Weight: 209 lb 7 oz (95 kg) IBW/kg (Calculated) : 77.6  Vital Signs: Temp: 97.9 F (36.6 C) (02/09 1300) Temp Source: Core (Comment) (02/09 0400) BP: 130/63 mmHg (02/09 1311) Pulse Rate: 63 (02/09 1311) Intake/Output from previous day: 02/08 0701 - 02/09 0700 In: 7134 [I.V.:6474; IV Piggyback:660] Out: 705 [Urine:465; Emesis/NG output:240] Intake/Output from this shift: Total I/O In: 4334.5 [I.V.:1234.5; IV Piggyback:3100] Out: 25 [Urine:25]  Labs:  Recent Labs  06/24/2014 1210 06/17/2014 1400  06/23/14 0500 06/24/14 0102 06/24/14 0505  WBC  --  6.3  --  14.1*  --  17.8*  HGB  --  10.5*  --  10.7*  --  10.6*  PLT  --  46*  --  62*  --  77*  LABCREA 190.56  --   --   --   --   --   CREATININE  --  3.27*  < > 3.98* 4.95* 4.92*  < > = values in this interval not displayed. Estimated Creatinine Clearance: 17.9 mL/min (by C-G formula based on Cr of 4.92). No results for input(s): VANCOTROUGH, VANCOPEAK, VANCORANDOM, GENTTROUGH, GENTPEAK, GENTRANDOM, TOBRATROUGH, TOBRAPEAK, TOBRARND, AMIKACINPEAK, AMIKACINTROU, AMIKACIN in the last 72 hours.   Microbiology: Recent Results (from the past 720 hour(s))  Blood culture (routine x 2)     Status: None (Preliminary result)   Collection Time: 07/06/2014  5:30 PM  Result Value Ref Range Status   Specimen Description BLOOD RIGHT ARM  Final   Special Requests BOTTLES DRAWN AEROBIC AND ANAEROBIC 10CC EA  Final   Culture   Final           BLOOD CULTURE RECEIVED NO GROWTH TO DATE CULTURE WILL BE HELD FOR 5 DAYS BEFORE ISSUING A FINAL NEGATIVE REPORT Note: Culture results may be compromised due to an excessive  volume of blood received in culture bottles. Performed at [facility]    Report Status PENDING  Incomplete  Blood culture (routine x 2)     Status: None (Preliminary result)   Collection Time: 07/09/2014  5:40 PM  Result Value Ref Range Status   Specimen Description BLOOD RIGHT HAND  Final   Special Requests BOTTLES DRAWN AEROBIC ONLY 10CC  Final   Culture   Final           BLOOD CULTURE RECEIVED NO GROWTH TO DATE CULTURE WILL BE HELD FOR 5 DAYS BEFORE ISSUING A FINAL NEGATIVE REPORT Note: Culture results may be compromised due to an excessive volume of blood received in culture bottles. Performed at [facility]    Report Status PENDING  Incomplete  Urine culture     Status: None   Collection Time: 07/09/2014 10:02 PM  Result Value Ref Range Status   Specimen Description URINE, CATHETERIZED  Final   Special Requests NONE  Final   Colony Count NO GROWTH Performed at [facility]   Final   Culture NO GROWTH Performed at [facility]   Final   Report Status 06/23/2014 FINAL  Final  MRSA PCR Screening     Status: None   Collection Time: 07/01/2014 11:55 PM  Result Value Ref Range Status   MRSA by PCR  NEGATIVE NEGATIVE Final    Comment:        The GeneXpert MRSA Assay (FDA approved for NASAL specimens only), is one component of a comprehensive MRSA colonization surveillance program. It is not intended to diagnose MRSA infection nor to guide or monitor treatment for MRSA infections.   Urine culture     Status: None   Collection Time: 06/30/2014 11:56 PM  Result Value Ref Range Status   Specimen Description URINE, CATHETERIZED  Final   Special Requests NONE  Final   Colony Count NO GROWTH Performed at [facility]   Final   Culture NO GROWTH Performed at [facility]   Final   Report Status 06/24/2014 FINAL  Final    Anti-infectives    Start     Dose/Rate Route Frequency Ordered Stop   06/24/14 2200  imipenem-cilastatin  (PRIMAXIN) 250 mg in sodium chloride 0.9 % 100 mL IVPB     250 mg200 mL/hr over 30 Minutes Intravenous Every 12 hours 06/24/14 1433     07/02/2014 2000  imipenem-cilastatin (PRIMAXIN) 250 mg in sodium chloride 0.9 % 100 mL IVPB  Status:  Discontinued     250 mg200 mL/hr over 30 Minutes Intravenous Every 6 hours 07/09/2014 1926 06/24/14 1433   07/07/2014 0130  piperacillin-tazobactam (ZOSYN) IVPB 2.25 g  Status:  Discontinued     2.25 g100 mL/hr over 30 Minutes Intravenous Every 6 hours 06/27/2014 1905 07/09/2014 1921   07/06/2014 1900  piperacillin-tazobactam (ZOSYN) IVPB 3.375 g  Status:  Discontinued     3.375 g100 mL/hr over 30 Minutes Intravenous  Once 07/07/2014 1859 07/02/2014 1215   06/24/2014 1800  vancomycin (VANCOCIN) 1,500 mg in sodium chloride 0.9 % 500 mL IVPB     1,500 mg250 mL/hr over 120 Minutes Intravenous  Once 06/18/2014 1649 06/17/2014 2243   06/29/2014 1700  piperacillin-tazobactam (ZOSYN) IVPB 3.375 g     3.375 g100 mL/hr over 30 Minutes Intravenous  Once 07/04/2014 1649 07/11/2014 1752      Assessment: 64 year old male on day #3 of Primaxin for pancreatitis. Tmax 101.4, WBC within normal limits, bu worsening lactic acidosis and worsening renal failure. Current CrCl ~ 17 mL/min.   Zosyn 2/7>>2/7 Primaxin 2/7 >>  2/6 Urine -neg 2/6 Blood >> 2/8 HIV Ab >> ip  Goal of Therapy:  Clinical resolution of infection  Plan:  Decrease Primaxin to 250mg  IV every 12 hours for worsening renal function. Follow-up renal function/plans Follow-up clinical status and culture results.  Sloan Leiter, PharmD, BCPS Clinical Pharmacist [phone] 06/24/2014,2:39 PM

## 2014-06-24 NOTE — Progress Notes (Signed)
CRITICAL VALUE ALERT  Critical value received:  Lactic acid 30  Date of notification:  06/23/14  Time of notification:  2100  Critical value read back:Yes.    Nurse who received alert: Fayrene Helper  MD notified (1st page):  Dr. Nelda Marseille  Time of first page:  2100  MD notified (2nd page):  Time of second page:  Responding MD:  Dr. Nelda Marseille  Time MD responded:  2100

## 2014-06-24 NOTE — Progress Notes (Signed)
Critical lactic acid of 20.8 called from lab. MD notified.

## 2014-06-24 NOTE — Progress Notes (Signed)
Dr. Olevia Perches notified of rectal bleeding. Stat H&H lab draw received via telephone.

## 2014-06-24 NOTE — Progress Notes (Signed)
Turned patient and noticed blood coming from rectum. MD notified.

## 2014-06-24 NOTE — Progress Notes (Signed)
Patient remains SB. Bp is appropriate with vasopressor drips. Bicarb push given and Amiodarone remains off. MD looked at patient. Ok with SB with appropriate bp per MD.

## 2014-06-24 NOTE — Progress Notes (Signed)
eLink Physician-Brief Progress Note Patient Name: [name] DOB: 22-Feb-1951 MRN: [id number]   Date of Service  06/24/2014  HPI/Events of Note  Ionized Calcium less than 1.0  eICU Interventions  Calcium gluconate 1 gm IV     Intervention Category Intermediate Interventions: Electrolyte abnormality - evaluation and management  DETERDING,ELIZABETH 06/24/2014, 2:02 AM

## 2014-06-24 NOTE — Progress Notes (Signed)
eLink Physician-Brief Progress Note Patient Name: [name] DOB: 05-23-1950 MRN: [id number]   Date of Service  06/24/2014  HPI/Events of Note  Persistent hypocalcemia  eICU Interventions  Calcium replaced     Intervention Category Intermediate Interventions: Electrolyte abnormality - evaluation and management  [name] 06/24/2014, 5:24 AM

## 2014-06-24 NOTE — Progress Notes (Signed)
Pt passed 100 cc of red blood per rectum at ~1330.   Heart rhythm is now in sinus brady.  At times BPs to 80s /40s, but generally in 90s/50s.  Hgb/Hct ordered.  The CT scan of 2/6 did reveal "proctitis involving the distal rectum to the level of the anal verge.  This likely explains the bright red blood per rectum" though do not see mention of rectal bleeding in any of the progress notes, H&P, etc.   Had colonoscopy with polypectomies 04/22/14 showing colon adenomas  Suspect rectal bleeding is secondary to the proctitis.  His coags have been trending down.  Hgb/hct stable as of this morning.    Not sure we need to perform flex sig, but will confer with Dr Olevia Perches.   Azucena Freed  PA-C  Will observe for further bleeding. Delfin Edis MD

## 2014-06-24 NOTE — Progress Notes (Signed)
CRITICAL VALUE ALERT  Critical value received:  Lactic acid 22.5  Date of notification:  06/24/2014  Time of notification:  0252  Critical value read back:Yes.    Nurse who received alert:  Fayrene Helper  MD notified (1st page):  Dr. Jimmy Footman  Time of first page:  0252  MD notified (2nd page):  Time of second page:  Responding MD:  Dr. Jimmy Footman  Time MD responded:  [phone]

## 2014-06-24 NOTE — Procedures (Signed)
Hemodialysis Catheter Insertion Procedure Note [name] [id number] 01-29-1951  Procedure: Insertion of Hemodialysis Catheter Indications: CVVH  Procedure Details Consent: Unable to obtain consent because of altered level of consciousness. Time Out: Verified patient identification, verified procedure, site/side was marked, verified correct patient position, special equipment/implants available, medications/allergies/relevent history reviewed, required imaging and test results available.  Performed  Maximum sterile technique was used including antiseptics, cap, gloves, gown, hand hygiene, mask and sheet. Skin prep: Chlorhexidine; local anesthetic administered A antimicrobial bonded/coated triple lumen catheter was placed in the right internal jugular vein using the Seldinger technique.  Evaluation Blood flow good Complications: No apparent complications Patient did tolerate procedure well. Chest X-ray ordered to verify placement.  CXR: pending.  Procedure performed under direct ultrasound guidance for real time vessel cannulation.      [name], [facility] Pgr: [phone]  or [phone] 06/24/2014, 12:48 PM

## 2014-06-24 NOTE — Progress Notes (Signed)
Critical lactic acid called from [name] in the lab: lactic = 21.2. MD notified.

## 2014-06-24 NOTE — Progress Notes (Signed)
Patient is now SB. MD notified. Verbal order for bicarb push received.

## 2014-06-24 NOTE — Progress Notes (Addendum)
Daily Rounding Note  06/24/2014, 8:35 AM  LOS: 3 days   SUBJECTIVE:       Developed A fib with RVR, hypotension last night. Amiodarone drip initiated.   OBJECTIVE:         Vital signs in last 24 hours:    Temp:  [99.2 F (37.3 C)-101.4 F (38.6 C)] 99.2 F (37.3 C) (02/09 0800) Pulse Rate:  [55-121] 121 (02/09 0831) Resp:  [35] 35 (02/09 0831) BP: (67-106)/(42-75) 106/54 mmHg (02/09 0831) SpO2:  [95 %-100 %] 99 % (02/09 0831) Arterial Line BP: (83-134)/(43-69) 134/69 mmHg (02/09 0800) FiO2 (%):  [30 %-40 %] 40 % (02/09 0831) Weight:  [209 lb 7 oz (95 kg)] 209 lb 7 oz (95 kg) (02/09 0500) Last BM Date:  (pta) Filed Weights   07/10/2014 2323 06/21/2014 0341 06/24/14 0500  Weight: 208 lb 12.4 oz (94.7 kg) 208 lb 12.4 oz (94.7 kg) 209 lb 7 oz (95 kg)   General: intubated on vent.  Unresponsive, except for pain, to exam   Heart: RRR. Rate in 1teens.   Chest: clear bil in front. No vent asynchrony Abdomen: tender, BS hypoactive.  Somewhat tense.   Extremities: purple, cool, mottled feet.   Neuro/Psych:  Unresponsive to exam.   Intake/Output from previous day: 02/08 0701 - 02/09 0700 In: 6959 [I.V.:6299; IV Piggyback:660] Out: 705 [Urine:465; Emesis/NG output:240]  Intake/Output this shift: Total I/O In: 174.9 [I.V.:74.9; IV Piggyback:100] Out: 25 [Urine:25]  Lab Results:  Recent Labs  06/18/2014 1400 06/23/14 0500 06/24/14 0505  WBC 6.3 14.1* 17.8*  HGB 10.5* 10.7* 10.6*  HCT 31.0* 31.1* 31.0*  PLT 46* 62* 77*   BMET  Recent Labs  06/23/14 0500 06/24/14 0102 06/24/14 0505  NA 141 140 137  K 4.7 5.1 5.3*  CL 103 95* 94*  CO2 12* 13* 12*  GLUCOSE 98 94 101*  BUN 47* 56* 61*  CREATININE 3.98* 4.95* 4.92*  CALCIUM 5.3* 4.6* 5.0*   LFT  Recent Labs  07/10/2014 2222 06/25/2014 0310  06/23/14 0500 06/24/14 0102 06/24/14 0505  PROT 3.4* 4.0*  --   --  3.2*  --   ALBUMIN 1.8* 2.2*  < > 1.8* 1.6* 1.5*    AST 175* 182*  --   --  177*  --   ALT 163* 154*  --   --  128*  --   ALKPHOS 70 69  --   --  100  --   BILITOT 3.6* 3.9*  --   --  5.0*  --   BILIDIR  --  2.9*  --   --   --   --   IBILI  --  1.0*  --   --   --   --   < > = values in this interval not displayed. PT/INR  Recent Labs  07/10/2014 1000 06/23/14 0500  LABPROT 23.8*  23.8* 22.1*  INR 2.11*  2.11* 1.91*   Hepatitis Panel No results for input(s): HEPBSAG, HCVAB, HEPAIGM, HEPBIGM in the last 72 hours.  Studies/Results: US Renal  06/20/2014   CLINICAL DATA:  Acute renal failure.  Hydronephrosis  EXAM: RENAL/URINARY TRACT ULTRASOUND COMPLETE  COMPARISON:  CT 06/18/2014  FINDINGS: Right Kidney:  Length: 8.6 cm. 13 mm upper pole cyst. Negative for hydronephrosis. Renal cortex normal in echogenicity.  Left Kidney:  Length: 9.7 cm. 12 mm upper pole cyst. Echogenicity within normal limits. No mass or hydronephrosis visualized.  Bladder:  Urinary  bladder is empty with a Foley catheter.  Small amount of free fluid in the abdomen.  IMPRESSION: Small right kidney.  Negative for hydronephrosis.   Electronically Signed   By: Franchot Gallo M.D.   On: 06/23/2014 18:54   Dg Chest Port 1 View  06/24/2014   CLINICAL DATA:  Respiratory failure.  EXAM: PORTABLE CHEST - 1 VIEW  COMPARISON:  06/24/2014.  FINDINGS: Endotracheal tube, left IJ line in stable position. NG tube tip appears to be the below the left hemidiaphragm. Slight forward positioning should be considered. Heart size normal. New onset bibasilar atelectasis and/or infiltrates and left pleural effusion noted. Right costophrenic angle not imaged. No acute osseous abnormality.  IMPRESSION: 1. Endotracheal tube and left IJ line in stable position. NG tube appears to be just below the left hemi diaphragm. Slight forward repositioning should be considered. 2. New onset of bibasilar atelectasis and/or infiltrates and left pleural effusion.   Electronically Signed   By: Marcello Moores  Register   On:  06/24/2014 07:27   Scheduled Meds: . sodium chloride   Intravenous Once  . sodium chloride   Intravenous Once  . antiseptic oral rinse  7 mL Mouth Rinse QID  . chlorhexidine  15 mL Mouth Rinse BID  . imipenem-cilastatin  250 mg Intravenous Q6H  . pantoprazole (PROTONIX) IV  40 mg Intravenous QHS   Continuous Infusions: . amiodarone 30 mg/hr (06/24/14 0755)  . fentaNYL infusion INTRAVENOUS Stopped (06/23/14 1739)  . norepinephrine (LEVOPHED) Adult infusion 30 mcg/min (06/24/14 0500)  . phenylephrine (NEO-SYNEPHRINE) Adult infusion 50 mcg/min (06/24/14 0500)  .  sodium bicarbonate  infusion 1000 mL 175 mL/hr at 06/24/14 0425  . vasopressin (PITRESSIN) infusion - *FOR SHOCK* 0.03 Units/min (06/23/14 0600)   PRN Meds:.sodium chloride, fentaNYL  ASSESMENT:   * Acute pancreatitis. ? Ischemic etiology. ? Biliary etiology.  Acute on chronic (dates to at least 2009) abdominal pain EGD and colonoscopy 04/2014: duodenitis, gastritis, retained gastric contents. Adenomatous colon polyps. Duodenitis on current CT scan, may be secondary to the pancreatitis.   * S/p 2/7 ex lap for suspicion of ischemic bowel. Bowel unremarkable, surgeon did sew small tear in SB mesentery and lyse a few adhesions.  * Gastroparesis. Slow emptying on nuc med GES study 05/13/14.   * Multi-organ failure: renal, respiratory. In setting of sepsis/shock. Persistent lactic acidosis.  On multiple pressors and Imipenem.    * Blood per rectum. CT scan shows proctitis. No proctitis on colonoscopy 04/22/2014. Suspect ischemic.   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  * Hepatic steatosis.  * Bilateral hydronephrosis without obstructing stone or mass.  ARF with oliguria.  Renal following.   * Normocytic anemia.  * Thrombocytopenia.  Platelets improved.   * Coagulopathy. DIC.   * Nissen fundoplication 6789.    PLAN   *  Ultrasound this AM.      Azucena Freed  06/24/2014,  8:35 AM Pager: [phone] Attending MD note:   I have taken a history, examined the patient, and reviewed the chart and portable abdominal ultrasound from this morning. Edematous pancreatitis with enlarging phlegmon encasing duodenum, particularly  In the head of the pancreas, ? Few areas of necrosis, no definite mass, biliary system  Not dilated. Increase amount of ascites. Fatty liver. The above ultrasound was actually  An old study from 06/02/2014. The  Follow up ultrasound from today is pending.  Recommend:  1. Increase IV fluids as suggested by Dr Lynford Citizen to compensate for 3rd space losses 2.May try  low rate post pyloric TF's and check residuals ( his bowl sounds are absent this am).  3. Follow besdside ultrasound imaging to assess extent of the phlegmon as the time goes. 4. If enteral feedings not tolerated, initiate cTNA  [facility] Pager # [phone]

## 2014-06-24 NOTE — Progress Notes (Signed)
The official report of abdominal ultrasound from this morning read by Dr Almyra Free, does not report on the abnormalities described by Dr Zigmund Daniel who read the ultrasound with me  before the official report came out this morning. I have spoken to Dr Yolonda Kida   and it appears that an old  Ultrasound was reviewed from 06/02/2014 . The images from today are limited , pancreas not visualized. I apologize for the misunderstanding.

## 2014-06-25 ENCOUNTER — Inpatient Hospital Stay (HOSPITAL_COMMUNITY): Payer: Commercial Managed Care - HMO

## 2014-06-25 ENCOUNTER — Encounter (HOSPITAL_COMMUNITY): Payer: Self-pay | Admitting: Radiology

## 2014-06-25 ENCOUNTER — Inpatient Hospital Stay (HOSPITAL_COMMUNITY): Admission: RE | Admit: 2014-06-25 | Payer: Commercial Managed Care - HMO | Source: Ambulatory Visit

## 2014-06-25 DIAGNOSIS — N179 Acute kidney failure, unspecified: Secondary | ICD-10-CM

## 2014-06-25 DIAGNOSIS — J9601 Acute respiratory failure with hypoxia: Secondary | ICD-10-CM

## 2014-06-25 LAB — RENAL FUNCTION PANEL
ALBUMIN: 1.4 g/dL — AB (ref 3.5–5.2)
ANION GAP: 33 — AB (ref 5–15)
Albumin: 1.4 g/dL — ABNORMAL LOW (ref 3.5–5.2)
Anion gap: 32 — ABNORMAL HIGH (ref 5–15)
BUN: 59 mg/dL — ABNORMAL HIGH (ref 6–23)
BUN: 56 mg/dL — AB (ref 6–23)
CALCIUM: 4.3 mg/dL — AB (ref 8.4–10.5)
CHLORIDE: 92 mmol/L — AB (ref 96–112)
CO2: 12 mmol/L — AB (ref 19–32)
CO2: 14 mmol/L — ABNORMAL LOW (ref 19–32)
Calcium: 4.4 mg/dL — CL (ref 8.4–10.5)
Chloride: 94 mmol/L — ABNORMAL LOW (ref 96–112)
Creatinine, Ser: 4.61 mg/dL — ABNORMAL HIGH (ref 0.50–1.35)
Creatinine, Ser: 4.54 mg/dL — ABNORMAL HIGH (ref 0.50–1.35)
GFR calc Af Amer: 14 mL/min — ABNORMAL LOW (ref >90)
GFR calc Af Amer: 14 mL/min — ABNORMAL LOW (ref >90)
GFR calc non Af Amer: 12 mL/min — ABNORMAL LOW (ref >90)
GFR, EST NON AFRICAN AMERICAN: 12 mL/min — AB (ref >90)
GLUCOSE: 145 mg/dL — AB (ref 70–99)
GLUCOSE: 139 mg/dL — AB (ref 70–99)
Phosphorus: 7.1 mg/dL — ABNORMAL HIGH (ref 2.3–4.6)
Phosphorus: 6.8 mg/dL — ABNORMAL HIGH (ref 2.3–4.6)
Potassium: 5 mmol/L (ref 3.5–5.1)
Potassium: 4.2 mmol/L (ref 3.5–5.1)
Sodium: 139 mmol/L (ref 135–145)
Sodium: 138 mmol/L (ref 135–145)

## 2014-06-25 LAB — SJOGRENS SYNDROME-B EXTRACTABLE NUCLEAR ANTIBODY: SSB (La) (ENA) Antibody, IgG: <1

## 2014-06-25 LAB — CBC WITH DIFFERENTIAL/PLATELET
BASOS PCT: 0 % (ref 0–1)
Basophils Absolute: 0 10*3/uL (ref 0.0–0.1)
EOS PCT: 1 % (ref 0–5)
Eosinophils Absolute: 0.2 10*3/uL (ref 0.0–0.7)
HEMATOCRIT: 31.3 % — AB (ref 39.0–52.0)
Hemoglobin: 10.8 g/dL — ABNORMAL LOW (ref 13.0–17.0)
LYMPHS ABS: 0.6 10*3/uL — AB (ref 0.7–4.0)
Lymphocytes Relative: 3 % — ABNORMAL LOW (ref 12–46)
MCH: 32.9 pg (ref 26.0–34.0)
MCHC: 34.5 g/dL (ref 30.0–36.0)
MCV: 95.4 fL (ref 78.0–100.0)
MONO ABS: 0.4 10*3/uL (ref 0.1–1.0)
MONOS PCT: 2 % — AB (ref 3–12)
NEUTROS ABS: 17.4 10*3/uL — AB (ref 1.7–7.7)
Neutrophils Relative %: 94 % — ABNORMAL HIGH (ref 43–77)
Platelets: 70 10*3/uL — ABNORMAL LOW (ref 150–400)
RBC: 3.28 MIL/uL — ABNORMAL LOW (ref 4.22–5.81)
RDW: 17.3 % — ABNORMAL HIGH (ref 11.5–15.5)
WBC Morphology: INCREASED
WBC: 18.6 10*3/uL — ABNORMAL HIGH (ref 4.0–10.5)

## 2014-06-25 LAB — GLUCOSE, CAPILLARY: Glucose-Capillary: 89 mg/dL (ref 70–99)

## 2014-06-25 LAB — TYPE AND SCREEN
ABO/RH(D): A POS
Antibody Screen: NEGATIVE
UNIT DIVISION: 0
UNIT DIVISION: 0
UNIT DIVISION: 0
Unit division: 0

## 2014-06-25 LAB — BLOOD GAS, ARTERIAL
Acid-base deficit: 9.2 mmol/L — ABNORMAL HIGH (ref 0.0–2.0)
Bicarbonate: 13.2 mEq/L — ABNORMAL LOW (ref 20.0–24.0)
DRAWN BY: 42624
FIO2: 0.4 %
MECHVT: 650 mL
O2 SAT: 97.6 %
PCO2 ART: 16.2 mmHg — AB (ref 35.0–45.0)
PEEP/CPAP: 5 cmH2O
PO2 ART: 136 mmHg — AB (ref 80.0–100.0)
Patient temperature: 98.6
RATE: 35 resp/min
TCO2: 13.7 mmol/L (ref 0–100)
pH, Arterial: 7.524 — ABNORMAL HIGH (ref 7.350–7.450)

## 2014-06-25 LAB — ANTI-SMITH ANTIBODY: ENA SM AB SER-ACNC: NEGATIVE

## 2014-06-25 LAB — MPO/PR-3 (ANCA) ANTIBODIES
ANCA Proteinase 3: <3.5 U/mL (ref 0.0–3.5)
Myeloperoxidase Abs: <9 U/mL (ref 0.0–9.0)

## 2014-06-25 LAB — POCT I-STAT 3, ART BLOOD GAS (G3+)
ACID-BASE DEFICIT: 10 mmol/L — AB (ref 0.0–2.0)
Bicarbonate: 14.4 mEq/L — ABNORMAL LOW (ref 20.0–24.0)
O2 Saturation: 98 %
PCO2 ART: 24.8 mmHg — AB (ref 35.0–45.0)
PH ART: 7.366 (ref 7.350–7.450)
TCO2: 15 mmol/L (ref 0–100)
pO2, Arterial: 103 mmHg — ABNORMAL HIGH (ref 80.0–100.0)

## 2014-06-25 LAB — CK: Total CK: 5298 U/L — ABNORMAL HIGH (ref 7–232)

## 2014-06-25 LAB — MAGNESIUM: MAGNESIUM: 1.9 mg/dL (ref 1.5–2.5)

## 2014-06-25 LAB — RHEUMATOID FACTOR: Rhuematoid fact SerPl-aCnc: <7 IU/mL (ref 0.0–13.9)

## 2014-06-25 LAB — CLOSTRIDIUM DIFFICILE BY PCR: Toxigenic C. Difficile by PCR: NEGATIVE

## 2014-06-25 LAB — ANA: Anti Nuclear Antibody(ANA): NEGATIVE

## 2014-06-25 LAB — ANTI-DNA ANTIBODY, DOUBLE-STRANDED: ds DNA Ab: <1 IU/mL

## 2014-06-25 LAB — SJOGRENS SYNDROME-A EXTRACTABLE NUCLEAR ANTIBODY: SSA (RO) (ENA) ANTIBODY, IGG: NEGATIVE

## 2014-06-25 LAB — ANTI-SCLERODERMA ANTIBODY: Scleroderma (Scl-70) (ENA) Antibody, IgG: <1

## 2014-06-25 LAB — CYCLIC CITRUL PEPTIDE ANTIBODY, IGG: Cyclic Citrullin Peptide Ab: <2 U/mL (ref 0.0–5.0)

## 2014-06-25 LAB — CALCIUM, IONIZED: Calcium, Ion: 0.49 mmol/L — CL (ref 1.12–1.32)

## 2014-06-25 LAB — LACTATE DEHYDROGENASE: LDH: 1498 U/L — AB (ref 94–250)

## 2014-06-25 MED ORDER — SODIUM CHLORIDE 0.9 % FOR CRRT
INTRAVENOUS_CENTRAL | Status: DC | PRN
  Administered 2014-06-25: 20:00:00 via INTRAVENOUS_CENTRAL
  Filled 2014-06-25 (×2): qty 1000

## 2014-06-25 MED ORDER — DOPAMINE-DEXTROSE 3.2-5 MG/ML-% IV SOLN
0.0000 ug/kg/min | INTRAVENOUS | Status: DC
  Administered 2014-06-25: 1 ug/kg/min via INTRAVENOUS

## 2014-06-25 MED ORDER — PRISMASOL BGK 4/2.5 32-4-2.5 MEQ/L IV SOLN
INTRAVENOUS | Status: DC
Start: 2014-06-25 — End: 2014-06-26
  Administered 2014-06-25: 21:00:00 via INTRAVENOUS_CENTRAL
  Filled 2014-06-25 (×3): qty 5000

## 2014-06-25 MED ORDER — SODIUM CHLORIDE 0.9 % IV SOLN
250.0000 mg | Freq: Four times a day (QID) | INTRAVENOUS | Status: DC
  Administered 2014-06-25: 250 mg via INTRAVENOUS
  Filled 2014-06-25 (×3): qty 250

## 2014-06-25 MED ORDER — DOPAMINE-DEXTROSE 3.2-5 MG/ML-% IV SOLN
INTRAVENOUS | Status: AC
  Administered 2014-06-25: 1 ug/kg/min via INTRAVENOUS
  Filled 2014-06-25: qty 250

## 2014-06-25 MED ORDER — PRISMASOL BGK 4/2.5 32-4-2.5 MEQ/L IV SOLN
INTRAVENOUS | Status: DC
Start: 2014-06-25 — End: 2014-06-26
  Administered 2014-06-25 – 2014-06-26 (×2): via INTRAVENOUS_CENTRAL
  Filled 2014-06-25 (×9): qty 5000

## 2014-06-25 MED ORDER — IOHEXOL 300 MG/ML  SOLN
25.0000 mL | INTRAMUSCULAR | Status: AC
  Administered 2014-06-25 (×2): 25 mL via ORAL

## 2014-06-25 MED ORDER — HEPARIN SODIUM (PORCINE) 1000 UNIT/ML DIALYSIS
1000.0000 [IU] | INTRAMUSCULAR | Status: DC | PRN
  Filled 2014-06-25: qty 3
  Filled 2014-06-25: qty 6

## 2014-06-25 MED ORDER — DEXTROSE 5 % IV SOLN
0.5000 ug/min | INTRAVENOUS | Status: DC
  Administered 2014-06-26: 2 ug/min via INTRAVENOUS
  Filled 2014-06-25: qty 4

## 2014-06-25 MED ORDER — IOHEXOL 300 MG/ML  SOLN
50.0000 mL | Freq: Once | INTRAMUSCULAR | Status: AC | PRN
  Administered 2014-06-25: 30 mL

## 2014-06-25 MED ORDER — PRISMASOL BGK 4/2.5 32-4-2.5 MEQ/L IV SOLN
INTRAVENOUS | Status: DC
  Administered 2014-06-25: 21:00:00 via INTRAVENOUS_CENTRAL
  Filled 2014-06-25 (×5): qty 5000

## 2014-06-25 NOTE — Progress Notes (Signed)
Talked with surgeon Dr. Molli Posey about increased drainage coming from abdomen. Dressing changed two times in the last two hours with lots of serosang fluid. Will apply wound vac and change mwf. Sandre Kitty

## 2014-06-25 NOTE — Procedures (Signed)
Pt transported to 2S by RN and RT without any complications.  Pt remains on current vent settings.

## 2014-06-25 NOTE — Progress Notes (Signed)
PULMONARY / CRITICAL CARE MEDICINE   Name: [name] MRN: [id number] DOB: Dec 07, 1950   PCP Unk Pinto DAVID, MD [facility] - Dr Alben Spittle [facility] - Ann Lions - last seen may 2012  ADMISSION DATE:  07/05/2014 CONSULTATION DATE:  06/23/2014   REFERRING MD :  ER at [facility]  CHIEF COMPLAINT:  Acute abd pain, lactic acidosis 17 . Mottled intubated  BRIEF:  64 year old male  With bp, dm, lipid, pre-dm and vitamin d def, with no family locally (brother in [place]) - hx from ER RN/MD. 5d hx of abdominal pain, weakness and falling. EMS found him hypoglycemic and hypothermic. In ER tachypneic and mottled needing intubation. Never dropped bP but lactic acidosis of 17 despite fluids and without clearance. PCCM Asked to admit. 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 Other findings include emphysema and non obstructive hydronephrosis  2012 eval for dyspnea  - Ex heavy passive smoker. Has chronic dyspnea on exertion with hyooxemia at rest. Dysnea is at class 2-3 level. On walking he does not sem to desaturate ad in fact pulse ox picked up suggesting underlying atelectasis related to raised left diaphragm. . 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 CPST test 05/24/2010 is normal but for mild reduction in HRresponse due to beta blocker and mild elevation in dead space possibly rlated to the left diaprhagm issue. There was no evidence of exercise induced bronchospasm on CPST. He comenced pulmonary rehab in March 2012 andd by May 2012 he had improved significantly with pulmnary rehab. This suggests deconditioning and weight issues and diaphragm as main causes of dyspnea  PAST MEDICAL HISTORY :   has a past medical history of Hyperlipidemia; HTN (hypertension); Allergic rhinitis; Headache(784.0); Nocturia; Urinary retention; Foley catheter in place; UTI (urinary  tract infection); GERD (gastroesophageal reflux disease); Prediabetes; Depression; Arthritis; and Bell's palsy.  has past surgical history that includes Hernia repair; Nissen fundoplication; Laparoscopic lysis intestinal adhesions; Transurethral resection of prostate (N/A, 03/20/2013); Carpal tunnel release; Nerve Surgery; and laparotomy (N/A, 07/01/2014).     SIGNIFICANT EVENTS: 06/23/2014 - admit, intubated and left IJ at [place]  06/25/2014: continued lactic acidosis > 15 -> OR nil acute finding on pLAP other than acute pancreatitis. Significant abd pain + -> improved after fent gtt and patient began to communicate better. Renal failure now - urology thinks is non obst hydro. ATN - from sepsis, pancreatitis, meloxicam and ct dye per renal. On bicarb gtt. On levophed. Coagulopathic as well   STUDIES: Andi-DNA ab 2/9>> Anti-scleroderma ab 2/9>> Anti-smith 2/9>> Anti-smooth muscle ab 2/9>> CCP 2/9>> AMA 2/9>> ANCA 2/9>> RF 2/9>> Sjogrens A ab 2/9>> Sjogrens B ab  2/9>>   SUBJECTIVE/OVERNIGHT/INTERVAL HX 06/25/14: on 29mcg levophed, 0.03 vaso and 19mcg neo. 79mcg fentanyl. Lactate slightly decreased. Ultrasound unremarkable but very poor study. Making some urine.  VITAL SIGNS: Temp:  [97.8 F (36.6 C)-99.3 F (37.4 C)] 97.9 F (36.6 C) (02/10 0600) Pulse Rate:  [54-125] 63 (02/10 0600) Resp:  [30-35] 35 (02/10 0400) BP: (75-130)/(19-74) 111/67 mmHg (02/10 0500) SpO2:  [99 %-100 %] 100 % (02/10 0600) Arterial Line BP: (92-134)/(50-70) 123/66 mmHg (02/10 0600) FiO2 (%):  [40 %] 40 % (02/10 0400) HEMODYNAMICS: CVP:  [11 mmHg-28 mmHg] 11 mmHg VENTILATOR SETTINGS: Vent Mode:  [-] PRVC FiO2 (%):  [40 %] 40 % Set Rate:  [35 bmp] 35 bmp Vt Set:  [277 mL]  650 mL PEEP:  [5 cmH20] 5 cmH20 Plateau Pressure:  [19 cmH20-23 cmH20] 22 cmH20 INTAKE / OUTPUT:  Intake/Output Summary (Last 24 hours) at 06/25/14 0719 Last data filed at 06/25/14 0600  Gross per 24 hour  Intake 10575.1 ml  Output     710 ml  Net 9865.1 ml    PHYSICAL EXAMINATION: General:  Well built male. Intubated. Looks critically ill Neuro: somnolent, arousable HEENT:  ET tube + Cardiovascular:  Regular rate and rhythm. Normal heart sounds Lungs:  CTA bilaterally Abdomen:  Soft, No mass. Could not hear bowel sounds. Does not appear tender Musculoskeletal:  No cyanosis, no clubbing,. No edema Skin:  Mottled in forearms and feet, knees look bruised  LABS: PULMONARY  Recent Labs Lab 07/08/2014 1524 07/10/2014 2040 06/23/14 0521 06/23/14 1617 06/24/14 0900  PHART 7.342* 7.310* 7.377 7.393 7.441  PCO2ART 17.9* 19.4* 19.7* 25.2* 18.0*  PO2ART 85.0 143.0* 104.0* 92.0 114.0*  HCO3 9.7* 9.7* 11.2* 15.3* 12.0*  TCO2 10 10 11.8 16 12.6  O2SAT 96.0 99.0 95.4 97.0 96.1    CBC  Recent Labs Lab 06/23/14 0500 06/24/14 0505 06/24/14 2019 06/25/14 0426  HGB 10.7* 10.6* 10.3* 10.8*  HCT 31.1* 31.0* 29.8* 31.3*  WBC 14.1* 17.8*  --  18.6*  PLT 62* 77*  --  70*    COAGULATION  Recent Labs Lab 06/20/14 1230 07/03/2014 1445 06/16/2014 2222 07/02/2014 1000 06/23/14 0500  INR 1.4* 2.34* 2.57* 2.11*  2.11* 1.91*    CARDIAC    Recent Labs Lab 06/27/2014 2222 06/24/2014 0310 06/24/2014 0630  TROPONINI 0.05* 0.05* 0.05*   No results for input(s): PROBNP in the last 168 hours.   CHEMISTRY  Recent Labs Lab 07/02/2014 2222 06/16/2014 0310  06/23/14 0500 06/24/14 0102 06/24/14 0505 06/24/14 1500 06/25/14 0426  NA 141 139  < > 141 140 137 139 139  K 5.4* 5.8*  < > 4.7 5.1 5.3* 4.5 5.0  CL 103 107  < > 103 95* 94* 97 94*  CO2 15* 11*  < > 12* 13* 12* 10* 12*  GLUCOSE 415* 154*  < > 98 94 101* 103* 145*  BUN 39* 42*  < > 47* 56* 61* 55* 59*  CREATININE 3.16* 3.42*  < > 3.98* 4.95* 4.92* 4.77* 4.61*  CALCIUM 6.0* 6.2*  < > 5.3* 4.6* 5.0* 4.3* 4.4*  MG 1.5 1.6  --  1.5  --  1.9  --  1.9  PHOS 7.0* 6.9*  --  5.6*  --  7.4*  --  7.1*  < > = values in this interval not displayed. Estimated Creatinine  Clearance: 19.1 mL/min (by C-G formula based on Cr of 4.61).   LIVER  Recent Labs Lab 06/20/14 1230 06/25/2014 1445 07/05/2014 2222 07/13/2014 0310 06/23/2014 1000 06/22/14 2055 06/23/14 0500 06/24/14 0102 06/24/14 0505 06/25/14 0426  AST 192*  192* 191* 175* 182*  --   --   --  177*  --   --   ALT 312*  312* 228* 163* 154*  --   --   --  128*  --   --   ALKPHOS 80  80 81 70 69  --   --   --  100  --   --   BILITOT 5.3*  5.3* 4.4* 3.6* 3.9*  --   --   --  5.0*  --   --   PROT 5.3*  5.3* 4.2* 3.4* 4.0*  --   --   --  3.2*  --   --  ALBUMIN 3.1*  3.1* 2.5* 1.8* 2.2*  --  1.9* 1.8* 1.6* 1.5* 1.4*  INR 1.4* 2.34* 2.57*  --  2.11*  2.11*  --  1.91*  --   --   --      INFECTIOUS  Recent Labs Lab 07/09/2014 1000  06/23/14 0500  06/24/14 0505  06/24/14 0805 06/24/14 1224 06/24/14 1600  LATICACIDVEN 15.9*  < > 17.1*  < >  --   < > 21.2* 20.8* 19.2*  PROCALCITON 8.35  --  7.79  --  8.83  --   --   --   --   < > = values in this interval not displayed.   ENDOCRINE CBG (last 3)  No results for input(s): GLUCAP in the last 72 hours.       IMAGING x48h US Abdomen Complete  06/24/2014   CLINICAL DATA:  Subsequent evaluation for acute pancreatitis, status post abdominal surgery 06/28/2014, re-evaluate abnormal gallbladder  EXAM: ULTRASOUND ABDOMEN COMPLETE  COMPARISON:  06/02/2014  FINDINGS: Gallbladder: The gallbladder is somewhat contracted. Wall thickness is within normal limits. No stones or pericholecystic fluid identified.  Common bile duct: Diameter: 2 mm  Liver: Extremely limited visualization. Abdominal wall dressings interfere with sonography. No focal abnormalities visualized in the areas that could be evaluated  IVC: Not seen  Pancreas: Not seen  Spleen: Not seen  Right Kidney:  Not seen  Left Kidney:  Not seen  Abdominal aorta: Not seen  Other findings: Study is limited by patient body habitus and abdominal wall postsurgical dressings.  IMPRESSION: Extremely limited  study showing mildly contracted gallbladder with no gallbladder wall thickening. No biliary dilatation identified.   Electronically Signed   By: Skipper Cliche M.D.   On: 06/24/2014 10:32   Dg Chest Port 1 View  06/24/2014   CLINICAL DATA:  64 year old male central line placement. Initial encounter.  EXAM: PORTABLE CHEST - 1 VIEW  COMPARISON:  0535 hours the same day.  FINDINGS: Portable AP semi upright view at 1244 hours. Right IJ approach central line now in place. Tip projects at the carina, near the left IJ line tip. Endotracheal tube and enteric tube appears stable. The enteric tube could be advanced to place the side hole more definitely within the stomach.  No pneumothorax. Continued left greater than right veiling opacity. Stable cardiac size and mediastinal contours.  IMPRESSION: 1. Right IJ approach central line placed, tip at the SVC level. No pneumothorax or adverse features. 2. Advanced enteric tube 5 cm for more optimal placement. 3.  Otherwise, stable lines and tubes.   Electronically Signed   By: Genevie Ann M.D.   On: 06/24/2014 13:31   Dg Chest Port 1 View  06/24/2014   CLINICAL DATA:  Respiratory failure.  EXAM: PORTABLE CHEST - 1 VIEW  COMPARISON:  07/08/2014.  FINDINGS: Endotracheal tube, left IJ line in stable position. NG tube tip appears to be the below the left hemidiaphragm. Slight forward positioning should be considered. Heart size normal. New onset bibasilar atelectasis and/or infiltrates and left pleural effusion noted. Right costophrenic angle not imaged. No acute osseous abnormality.  IMPRESSION: 1. Endotracheal tube and left IJ line in stable position. NG tube appears to be just below the left hemi diaphragm. Slight forward repositioning should be considered. 2. New onset of bibasilar atelectasis and/or infiltrates and left pleural effusion.   Electronically Signed   By: Marcello Moores  Register   On: 06/24/2014 07:27   Dg Abd Portable 1v  06/24/2014   CLINICAL  DATA:  Nasogastric tube.   EXAM: PORTABLE ABDOMEN - 1 VIEW  COMPARISON:  06/24/2014 at 5:09  FINDINGS: A nasogastric tube tip is in unchanged position, tip over the proximal stomach. A feeding tube is not visible. There is moderate gaseous distention of the stomach. Bibasilar lung opacities as seen on dedicated chest imaging. These results were called by telephone at the time of interpretation on 06/24/2014 at 6:07 pm to RN Lauren, who verbally acknowledged these results. Chest x-ray has been suggested.  IMPRESSION: Orogastric tube with tip over the proximal stomach. A feeding tube is not visualized.   Electronically Signed   By: Monte Fantasia M.D.   On: 06/24/2014 18:08   Dg Abd Portable 1v  06/24/2014   CLINICAL DATA:  Feeding tube placement  EXAM: PORTABLE ABDOMEN - 1 VIEW  COMPARISON:  June 22, 2014  FINDINGS: Feeding tube tip is in the proximal stomach. There is mild distention of the stomach with air. There are coils in the mid right abdomen. There is an overall paucity of gas outside of the stomach. No free air seen.  IMPRESSION: Feeding tube tip in proximal stomach. Stomach mildly distended with air. Elsewhere relative paucity of gas is seen. Paucity of gas of this nature may be normal but also may be indicative of early ileus or enteritis. Obstruction not felt to be likely. No free air appreciable.   Electronically Signed   By: Lowella Grip III M.D.   On: 06/24/2014 17:32       ASSESSMENT / PLAN:  PULMONARY OETT 06/18/2014 ER A:acute resp failure due to renal failure, lactic acidosis and pain and metabolic demand   P:   PRVC  CARDIOVASCULAR CVL- LEft IJ 07/10/2014  A: Circulatory shock - pancreatitis and sepsis  P:  Hydrate CVP goal >10 with fluids MAP goal > 65 with pressors Await  Echo IVF  RENAL A:   Acute renal failure  - non obst hydro on CT Proffound lactic acidosis - not on metformin, invokana   - acidotic with poor lactate clearance x 48h. Worrise. Unclear why not clearing  P:   Renal  consult - conservative mgmt for now -> CRRT if worsens Urology consult - monitor -> renal US -> no hydro Hydrate aggressively Track lactate q4h Foley Trend CK daily  GASTROINTESTINAL A: #Baseline  - sees Dr Deatra Ina. Gastroparesis +  # Current   Acute  pancreatitis with ? Shock liver   - s/p plap 07/10/2014 - pancreatitis only finding - 06/25/2014 - pancreas consistent with chronic pancreatitis.   P:   NPO  CCS input appreciated Follow-up abdominal ultrasound  HEMATOLOGIC A:  Coagulopathic - improved   P:  Monitor closely  INFECTIOUS Blood  Urine Trach aspirate Follow-up immune studies outlined above  A:  Septic shock based on PCT. No source beyond pancreatitis indentified P:   Imipenem  ENDOCRINE A:  PRe-DM at baseline   P:   ssi   NEUROLOGIC A:  At risk for delirium   - pain well controlled with fent gtt P:   Fent prn -> gtt NO  diprivan due to acidosis  FAMILY  - Updates:  No family at bedside. Brother in [place]. Patient updated somewhat 06/25/2014   - Inter-disciplinary family meet or Palliative Care meeting due by: 06/28/14    Cordelia Poche, MD PGY-2, [facility] 06/25/2014, 7:19 AM

## 2014-06-25 NOTE — Progress Notes (Signed)
Results from radiology regarding CT scan relayed to Dr. Jimmy Footman

## 2014-06-25 NOTE — Progress Notes (Signed)
RT Note: Pt transported to CT & back to room with no complications. RT will continue to monitor.

## 2014-06-25 NOTE — Progress Notes (Signed)
3 Days Post-Op  Subjective: Pt opens eyes and reacts to pain  Objective: Vital signs in last 24 hours: Temp:  [97.8 F (36.6 C)-99.3 F (37.4 C)] 98.1 F (36.7 C) (02/10 0700) Pulse Rate:  [54-125] 63 (02/10 0600) Resp:  [30-35] 35 (02/10 0400) BP: (75-130)/(19-74) 111/67 mmHg (02/10 0500) SpO2:  [100 %] 100 % (02/10 0600) Arterial Line BP: (92-128)/(50-70) 115/64 mmHg (02/10 0700) FiO2 (%):  [40 %] 40 % (02/10 0745) Weight:  [211 lb 6.7 oz (95.9 kg)] 211 lb 6.7 oz (95.9 kg) (02/10 0500) Last BM Date: 06/24/14  Intake/Output from previous day: 02/09 0701 - 02/10 0700 In: 10575.1 [I.V.:7425.1; IV Piggyback:3150] Out: 710 [Urine:710] Intake/Output this shift:    Incision/Wound:open and clean  Soft abdomen without rigidity  Lab Results:   Recent Labs  06/24/14 0505 06/24/14 2019 06/25/14 0426  WBC 17.8*  --  18.6*  HGB 10.6* 10.3* 10.8*  HCT 31.0* 29.8* 31.3*  PLT 77*  --  70*   BMET  Recent Labs  06/24/14 1500 06/25/14 0426  NA 139 139  K 4.5 5.0  CL 97 94*  CO2 10* 12*  GLUCOSE 103* 145*  BUN 55* 59*  CREATININE 4.77* 4.61*  CALCIUM 4.3* 4.4*   PT/INR  Recent Labs  06/25/2014 1000 06/23/14 0500  LABPROT 23.8*  23.8* 22.1*  INR 2.11*  2.11* 1.91*   ABG  Recent Labs  06/23/14 1617 06/24/14 0900  PHART 7.393 7.441  HCO3 15.3* 12.0*    Studies/Results: Ct Chest Wo Contrast  06/25/2014   CLINICAL DATA:  Acute respiratory failure and hypoxemia. Multiple organ failure. Initial encounter.  EXAM: CT CHEST WITHOUT CONTRAST  TECHNIQUE: Multidetector CT imaging of the chest was performed following the standard protocol without IV contrast.  COMPARISON:  Chest radiograph performed 06/24/2014, and CT of the chest performed 07/07/2014  FINDINGS: Small bilateral pleural effusions are seen, with partial consolidation of both lower lung lobes. This raises concern for pneumonia; aspiration cannot be excluded, though no debris is noted in the visualized  bronchioles. The expanded portions of both lungs appear clear. No masses are seen, though evaluation is limited in areas of airspace consolidation.  The patient's endotracheal tube is seen ending 2-3 cm above the carina. A right IJ sheath is noted ending about the mid SVC. A left IJ line is noted ending about the proximal SVC. No pericardial effusion is identified. No mediastinal lymphadenopathy is seen. The great vessels are grossly unremarkable, though difficult to assess without contrast. The thyroid gland is unremarkable in appearance. No axillary lymphadenopathy is seen.  There is persistent haziness about the pancreas, not fully assessed on this study. This reflects persistent pancreatitis. Underlying new small volume ascites is noted within the upper abdomen, surrounding the liver and spleen.  There is diffuse wall edema and thickening involving the visualized portions of the colon, compatible with acute colitis, new from the recent prior study. C. difficile colitis is a concern. Would correlate clinically.  Nonspecific perinephric stranding is noted bilaterally. The gallbladder is filled with contrast. The adrenal glands are grossly unremarkable. A nasogastric tube is noted ending at the fundus of the stomach.  Note is made of a feeding tube coiled back on itself in the fundus of the stomach, and extending back into the distal esophagus, where it coils on itself several times, and ends at the distal esophagus.  No acute osseous abnormalities are seen. Minimal degenerative change is noted at the lower cervical spine.  IMPRESSION: 1. Small bilateral  pleural effusions, with partial consolidation of both lower lung lobes. This is new from the prior study, and raises concern for pneumonia. Aspiration cannot be excluded, though no debris is seen within the visualized bronchioles. 2. New diffuse colonic wall edema and thickening, compatible with acute colitis. C. difficile colitis is a concern. Would correlate  clinically. 3. Pancreatitis again noted, not fully assessed on this study. New underlying small volume ascites noted within the upper abdomen. 4. Feeding tube noted coiled back on itself in the fundus of the stomach, extending back into the distal esophagus, where it coils on itself several times, and ends at the distal esophagus. This should be removed and replaced. 5. Nasogastric tube noted ending at the fundus of the stomach; this could be advanced, as deemed clinically appropriate.  These results were called by telephone at the time of interpretation on 06/25/2014 at 4:36 am to Nursing on [facility], who verbally acknowledged these results.   Electronically Signed   By: Garald Balding M.D.   On: 06/25/2014 07:52   US Abdomen Complete  06/24/2014   CLINICAL DATA:  Subsequent evaluation for acute pancreatitis, status post abdominal surgery 07/06/2014, re-evaluate abnormal gallbladder  EXAM: ULTRASOUND ABDOMEN COMPLETE  COMPARISON:  06/02/2014  FINDINGS: Gallbladder: The gallbladder is somewhat contracted. Wall thickness is within normal limits. No stones or pericholecystic fluid identified.  Common bile duct: Diameter: 2 mm  Liver: Extremely limited visualization. Abdominal wall dressings interfere with sonography. No focal abnormalities visualized in the areas that could be evaluated  IVC: Not seen  Pancreas: Not seen  Spleen: Not seen  Right Kidney:  Not seen  Left Kidney:  Not seen  Abdominal aorta: Not seen  Other findings: Study is limited by patient body habitus and abdominal wall postsurgical dressings.  IMPRESSION: Extremely limited study showing mildly contracted gallbladder with no gallbladder wall thickening. No biliary dilatation identified.   Electronically Signed   By: Skipper Cliche M.D.   On: 06/24/2014 10:32   Dg Chest Port 1 View  06/25/2014   CLINICAL DATA:  Assess pleural effusion  EXAM: PORTABLE CHEST - 1 VIEW  COMPARISON:  Portable chest x-ray of June 24, 2014 and chest CT scan of  today's date at 1:42 a.m.  FINDINGS: The right lung is hypoinflated but clear. On the left there is obscuration of the hemidiaphragm and blunting of the costophrenic angle and increased retrocardiac density. The cardiac silhouette is normal in size. The pulmonary vascularity is not engorged.  The endotracheal tube tip projects 4 cm above the crotch of the carina. The right internal jugular Cordis sheath tip projects over the midportion of the SVC. The left internal jugular venous catheter tip projects over the proximal portion of the SVC. The radiodense tipped feeding tube has doubled upon itself and its tip lies just above the level of the GE junction while a portion of the coil tube extends below the GE junction into the gastric cardia. Withdrawal and repositioning is needed.  IMPRESSION: 1. Stable bilateral pulmonary hypoinflation with left basilar atelectasis and small left pleural effusion. 2. The feeding tube the tip lies in the region of the distal esophagus. Portions of the tube extend more distally into the gastric cardia. Withdrawal of the tube and repositioning is recommended prior to use of the tube. 3. The other support tubes and lines are in reasonable position radiographically. 4. These results will be called to the ordering clinician or representative by the Radiologist Assistant, and communication documented in the PACS or zVision  Dashboard.   Electronically Signed   By: David  Martinique   On: 06/25/2014 07:37   Dg Chest Port 1 View  06/24/2014   CLINICAL DATA:  64 year old male central line placement. Initial encounter.  EXAM: PORTABLE CHEST - 1 VIEW  COMPARISON:  0535 hours the same day.  FINDINGS: Portable AP semi upright view at 1244 hours. Right IJ approach central line now in place. Tip projects at the carina, near the left IJ line tip. Endotracheal tube and enteric tube appears stable. The enteric tube could be advanced to place the side hole more definitely within the stomach.  No  pneumothorax. Continued left greater than right veiling opacity. Stable cardiac size and mediastinal contours.  IMPRESSION: 1. Right IJ approach central line placed, tip at the SVC level. No pneumothorax or adverse features. 2. Advanced enteric tube 5 cm for more optimal placement. 3.  Otherwise, stable lines and tubes.   Electronically Signed   By: Genevie Ann M.D.   On: 06/24/2014 13:31   Dg Chest Port 1 View  06/24/2014   CLINICAL DATA:  Respiratory failure.  EXAM: PORTABLE CHEST - 1 VIEW  COMPARISON:  07/01/2014.  FINDINGS: Endotracheal tube, left IJ line in stable position. NG tube tip appears to be the below the left hemidiaphragm. Slight forward positioning should be considered. Heart size normal. New onset bibasilar atelectasis and/or infiltrates and left pleural effusion noted. Right costophrenic angle not imaged. No acute osseous abnormality.  IMPRESSION: 1. Endotracheal tube and left IJ line in stable position. NG tube appears to be just below the left hemi diaphragm. Slight forward repositioning should be considered. 2. New onset of bibasilar atelectasis and/or infiltrates and left pleural effusion.   Electronically Signed   By: Marcello Moores  Register   On: 06/24/2014 07:27   Dg Abd Portable 1v  06/25/2014   CLINICAL DATA:  NG tube placement.  EXAM: PORTABLE ABDOMEN - 1 VIEW  COMPARISON:  06/24/2014 .  FINDINGS: NG tube in stable position. No bowel distention or free air. Surgical clips over the abdomen. No acute bony abnormality.  IMPRESSION: NG tube in stable position with tip in proximal stomach. No bowel distention.   Electronically Signed   By: Marcello Moores  Register   On: 06/25/2014 07:46   Dg Abd Portable 1v  06/24/2014   CLINICAL DATA:  Nasogastric tube.  EXAM: PORTABLE ABDOMEN - 1 VIEW  COMPARISON:  06/24/2014 at 5:09  FINDINGS: A nasogastric tube tip is in unchanged position, tip over the proximal stomach. A feeding tube is not visible. There is moderate gaseous distention of the stomach. Bibasilar lung  opacities as seen on dedicated chest imaging. These results were called by telephone at the time of interpretation on 06/24/2014 at 6:07 pm to RN Lauren, who verbally acknowledged these results. Chest x-ray has been suggested.  IMPRESSION: Orogastric tube with tip over the proximal stomach. A feeding tube is not visualized.   Electronically Signed   By: Monte Fantasia M.D.   On: 06/24/2014 18:08   Dg Abd Portable 1v  06/24/2014   CLINICAL DATA:  Feeding tube placement  EXAM: PORTABLE ABDOMEN - 1 VIEW  COMPARISON:  June 22, 2014  FINDINGS: Feeding tube tip is in the proximal stomach. There is mild distention of the stomach with air. There are coils in the mid right abdomen. There is an overall paucity of gas outside of the stomach. No free air seen.  IMPRESSION: Feeding tube tip in proximal stomach. Stomach mildly distended with air. Elsewhere relative  paucity of gas is seen. Paucity of gas of this nature may be normal but also may be indicative of early ileus or enteritis. Obstruction not felt to be likely. No free air appreciable.   Electronically Signed   By: Lowella Grip III M.D.   On: 06/24/2014 17:32    Anti-infectives: Anti-infectives    Start     Dose/Rate Route Frequency Ordered Stop   06/24/14 2200  imipenem-cilastatin (PRIMAXIN) 250 mg in sodium chloride 0.9 % 100 mL IVPB     250 mg 200 mL/hr over 30 Minutes Intravenous Every 12 hours 06/24/14 1433     07/02/2014 2000  imipenem-cilastatin (PRIMAXIN) 250 mg in sodium chloride 0.9 % 100 mL IVPB  Status:  Discontinued     250 mg 200 mL/hr over 30 Minutes Intravenous Every 6 hours 07/11/2014 1926 06/24/14 1433   06/28/2014 0130  piperacillin-tazobactam (ZOSYN) IVPB 2.25 g  Status:  Discontinued     2.25 g 100 mL/hr over 30 Minutes Intravenous Every 6 hours 06/25/2014 1905 06/25/2014 1921   07/11/2014 1900  piperacillin-tazobactam (ZOSYN) IVPB 3.375 g  Status:  Discontinued     3.375 g 100 mL/hr over 30 Minutes Intravenous  Once 07/11/2014 1859  06/21/2014 1215   07/11/2014 1800  vancomycin (VANCOCIN) 1,500 mg in sodium chloride 0.9 % 500 mL IVPB     1,500 mg 250 mL/hr over 120 Minutes Intravenous  Once 06/20/2014 1649 06/27/2014 2243   06/25/2014 1700  piperacillin-tazobactam (ZOSYN) IVPB 3.375 g     3.375 g 100 mL/hr over 30 Minutes Intravenous  Once 06/25/2014 1649 07/02/2014 1752      Assessment/Plan: s/p Procedure(s): EXPLORATORY LAPAROTOMY  (N/A) CONTINUES TO BE CRITICALLY ILL Continue supportive care for now Agree with TNA  For now  Patient Active Problem List   Diagnosis Date Noted  . Metabolic acidosis   . Shock   . Acute respiratory failure 06/25/2014  . Acute renal failure 06/22/2014  . Acidosis 07/13/2014  . Lactic acidosis 06/27/2014  . Acute pancreatitis 07/09/2014  . Abnormal liver function tests 06/20/2014  . Medication management 05/22/2014  . Gastroparesis 05/22/2014  . Abdominal pain 02/25/2014  . Family history of colon cancer 02/25/2014  . CKD Stage III (GFR 44 ml/min) 02/10/2014  . Vitamin D deficiency 08/05/2013  . GERD   . Prediabetes   . Depression   . Allergic rhinitis   . HTN 05/06/2013  . Hyperlipidemia 05/06/2013  . Hydronephrosis, bilateral 01/04/2013  . Acute on chronic renal insufficiency 01/04/2013  . BPH with urinary obstruction 01/04/2013  . IRON DEFICIENCY 05/26/2008    LOS: 4 days    Neyda Durango A. 06/25/2014

## 2014-06-25 NOTE — Progress Notes (Signed)
ANTIBIOTIC CONSULT NOTE - FOLLOW UP  Pharmacy Consult for Primaxin Indication: pancreatitis  Allergies  Allergen Reactions  . Chloraseptic Sore Throat [Acetaminophen] Other (See Comments)    Spray burnt mouth!  . Milk-Related Compounds Other (See Comments)    Causes gas and diarrhea    Patient Measurements: Height: 6' (182.9 cm) Weight: 211 lb 6.7 oz (95.9 kg) IBW/kg (Calculated) : 77.6  Vital Signs: Temp: 97.9 F (36.6 C) (02/10 1933) Temp Source: Oral (02/10 1933) BP: 72/28 mmHg (02/10 2100) Pulse Rate: 25 (02/10 2100) Intake/Output from previous day: 02/09 0701 - 02/10 0700 In: 16109 [I.V.:7888; IV Piggyback:3150] Out: 710 [Urine:710] Intake/Output from this shift: Total I/O In: 603.8 [I.V.:603.8] Out: 232 [Urine:75; Other:157]  Labs:  Recent Labs  06/23/14 0500  06/24/14 0505 06/24/14 1500 06/24/14 2019 06/25/14 0426 06/25/14 1145  WBC 14.1*  --  17.8*  --   --  18.6*  --   HGB 10.7*  --  10.6*  --  10.3* 10.8*  --   PLT 62*  --  77*  --   --  70*  --   CREATININE 3.98*  < > 4.92* 4.77*  --  4.61* 4.54*  < > = values in this interval not displayed. Estimated Creatinine Clearance: 19.5 mL/min (by C-G formula based on Cr of 4.54). No results for input(s): VANCOTROUGH, VANCOPEAK, VANCORANDOM, GENTTROUGH, GENTPEAK, GENTRANDOM, TOBRATROUGH, TOBRAPEAK, TOBRARND, AMIKACINPEAK, AMIKACINTROU, AMIKACIN in the last 72 hours.   Microbiology: Recent Results (from the past 720 hour(s))  Blood culture (routine x 2)     Status: None (Preliminary result)   Collection Time: 06/27/2014  5:30 PM  Result Value Ref Range Status   Specimen Description BLOOD RIGHT ARM  Final   Special Requests BOTTLES DRAWN AEROBIC AND ANAEROBIC 10CC EA  Final   Culture   Final           BLOOD CULTURE RECEIVED NO GROWTH TO DATE CULTURE WILL BE HELD FOR 5 DAYS BEFORE ISSUING A FINAL NEGATIVE REPORT Note: Culture results may be compromised due to an excessive volume of blood received in culture  bottles. Performed at [facility]    Report Status PENDING  Incomplete  Blood culture (routine x 2)     Status: None (Preliminary result)   Collection Time: 07/11/2014  5:40 PM  Result Value Ref Range Status   Specimen Description BLOOD RIGHT HAND  Final   Special Requests BOTTLES DRAWN AEROBIC ONLY 10CC  Final   Culture   Final           BLOOD CULTURE RECEIVED NO GROWTH TO DATE CULTURE WILL BE HELD FOR 5 DAYS BEFORE ISSUING A FINAL NEGATIVE REPORT Note: Culture results may be compromised due to an excessive volume of blood received in culture bottles. Performed at [facility]    Report Status PENDING  Incomplete  Urine culture     Status: None   Collection Time: 06/25/2014 10:02 PM  Result Value Ref Range Status   Specimen Description URINE, CATHETERIZED  Final   Special Requests NONE  Final   Colony Count NO GROWTH Performed at [facility]   Final   Culture NO GROWTH Performed at [facility]   Final   Report Status 06/23/2014 FINAL  Final  MRSA PCR Screening     Status: None   Collection Time: 07/05/2014 11:55 PM  Result Value Ref Range Status   MRSA by PCR NEGATIVE NEGATIVE Final    Comment:  The GeneXpert MRSA Assay (FDA approved for NASAL specimens only), is one component of a comprehensive MRSA colonization surveillance program. It is not intended to diagnose MRSA infection nor to guide or monitor treatment for MRSA infections.   Urine culture     Status: None   Collection Time: 07/06/2014 11:56 PM  Result Value Ref Range Status   Specimen Description URINE, CATHETERIZED  Final   Special Requests NONE  Final   Colony Count NO GROWTH Performed at [facility]   Final   Culture NO GROWTH Performed at [facility]   Final   Report Status 06/24/2014 FINAL  Final  Clostridium Difficile by PCR     Status: None   Collection Time: 06/25/14  6:45 AM  Result Value Ref Range Status   C difficile by pcr NEGATIVE  NEGATIVE Final    Anti-infectives    Start     Dose/Rate Route Frequency Ordered Stop   06/25/14 2200  imipenem-cilastatin (PRIMAXIN) 250 mg in sodium chloride 0.9 % 100 mL IVPB     250 mg 200 mL/hr over 30 Minutes Intravenous Every 6 hours 06/25/14 2123     06/24/14 2200  imipenem-cilastatin (PRIMAXIN) 250 mg in sodium chloride 0.9 % 100 mL IVPB  Status:  Discontinued     250 mg 200 mL/hr over 30 Minutes Intravenous Every 12 hours 06/24/14 1433 06/25/14 2123   07/10/2014 2000  imipenem-cilastatin (PRIMAXIN) 250 mg in sodium chloride 0.9 % 100 mL IVPB  Status:  Discontinued     250 mg 200 mL/hr over 30 Minutes Intravenous Every 6 hours 07/10/2014 1926 06/24/14 1433   06/29/2014 0130  piperacillin-tazobactam (ZOSYN) IVPB 2.25 g  Status:  Discontinued     2.25 g 100 mL/hr over 30 Minutes Intravenous Every 6 hours 06/23/2014 1905 06/17/2014 1921   06/25/2014 1900  piperacillin-tazobactam (ZOSYN) IVPB 3.375 g  Status:  Discontinued     3.375 g 100 mL/hr over 30 Minutes Intravenous  Once 06/29/2014 1859 07/08/2014 1215   07/11/2014 1800  vancomycin (VANCOCIN) 1,500 mg in sodium chloride 0.9 % 500 mL IVPB     1,500 mg 250 mL/hr over 120 Minutes Intravenous  Once 07/05/2014 1649 07/13/2014 2243   07/05/2014 1700  piperacillin-tazobactam (ZOSYN) IVPB 3.375 g     3.375 g 100 mL/hr over 30 Minutes Intravenous  Once 07/01/2014 1649 06/20/2014 1752      Assessment: 64 year old male on day #3 of Primaxin for pancreatitis. Tmax 101.4, WBC within normal limits, bu worsening lactic acidosis and worsening renal failure. Current CrCl ~ 17 mL/min.   Zosyn 2/7>>2/7 Primaxin 2/7 >>  2/6 Urine -neg 2/6 Blood >> 2/8 HIV Ab >> ip  Pt is starting CRRT due to persistent acidosis, uremia, and fluid control.   Goal of Therapy:  Clinical resolution of infection  Plan:  Increase Primaxin to 250mg  IV every 6 hours for CRRT dosing Follow-up renal function/plans Follow-up clinical status and culture results  Andrey Cota. Diona Foley,  [place] Pharmacist Pager [phone] 06/25/2014,9:23 PM

## 2014-06-25 NOTE — Progress Notes (Signed)
Daily Rounding Note  06/25/2014, 8:20 AM  LOS: 4 days   SUBJECTIVE:       Stools are bloody.  Remains on vent, multiple pressors in place.   OBJECTIVE:         Vital signs in last 24 hours:    Temp:  [97.8 F (36.6 C)-99.3 F (37.4 C)] 98.1 F (36.7 C) (02/10 0700) Pulse Rate:  [54-125] 63 (02/10 0600) Resp:  [30-35] 35 (02/10 0400) BP: (75-130)/(19-74) 111/67 mmHg (02/10 0500) SpO2:  [99 %-100 %] 100 % (02/10 0600) Arterial Line BP: (92-128)/(50-70) 115/64 mmHg (02/10 0700) FiO2 (%):  [40 %] 40 % (02/10 0745) Weight:  [211 lb 6.7 oz (95.9 kg)] 211 lb 6.7 oz (95.9 kg) (02/10 0500) Last BM Date: 06/24/14 Filed Weights   07/13/2014 0341 06/24/14 0500 06/25/14 0500  Weight: 208 lb 12.4 oz (94.7 kg) 209 lb 7 oz (95 kg) 211 lb 6.7 oz (95.9 kg)   General: ill, eyes are icteric.  Remains on vent   Heart: RRR Chest: breathing in sync with vent.  Abdomen: distended, tight, not grimacing with palpation.   Serrous, slightly pink, non purulent discharge saturating gauze over lap wound GU:  Penile, scrotal edema, slight fresh blood at glans.  Urine amber colored  Extremities: mottled, cool, purple feet, lower lgs.   Neuro/Psych:  Opens eyes to name and nods but not following commands.   Intake/Output from previous day: 02/09 0701 - 02/10 0700 In: 10575.1 [I.V.:7425.1; IV Piggyback:3150] Out: 710 [Urine:710]  Intake/Output this shift:    Lab Results:  Recent Labs  06/23/14 0500 06/24/14 0505 06/24/14 2019 06/25/14 0426  WBC 14.1* 17.8*  --  18.6*  HGB 10.7* 10.6* 10.3* 10.8*  HCT 31.1* 31.0* 29.8* 31.3*  PLT 62* 77*  --  70*   BMET  Recent Labs  06/24/14 0505 06/24/14 1500 06/25/14 0426  NA 137 139 139  K 5.3* 4.5 5.0  CL 94* 97 94*  CO2 12* 10* 12*  GLUCOSE 101* 103* 145*  BUN 61* 55* 59*  CREATININE 4.92* 4.77* 4.61*  CALCIUM 5.0* 4.3* 4.4*   LFT  Recent Labs  06/24/14 0102 06/24/14 0505  06/25/14 0426  PROT 3.2*  --   --   ALBUMIN 1.6* 1.5* 1.4*  AST 177*  --   --   ALT 128*  --   --   ALKPHOS 100  --   --   BILITOT 5.0*  --   --    PT/INR  Recent Labs  06/23/2014 1000 06/23/14 0500  LABPROT 23.8*  23.8* 22.1*  INR 2.11*  2.11* 1.91*   Studies/Results: Ct Chest Wo Contrast 06/25/2014   CLINICAL DATA:  Acute respiratory failure and hypoxemia. Multiple organ failure. Initial encounter.  EXAM: CT CHEST WITHOUT CONTRAST  TECHNIQUE: Multidetector CT imaging of the chest was performed following the standard protocol without IV contrast.  COMPARISON:  Chest radiograph performed 06/24/2014, and CT of the chest performed 07/05/2014  FINDINGS: Small bilateral pleural effusions are seen, with partial consolidation of both lower lung lobes. This raises concern for pneumonia; aspiration cannot be excluded, though no debris is noted in the visualized bronchioles. The expanded portions of both lungs appear clear. No masses are seen, though evaluation is limited in areas of airspace consolidation.  The patient's endotracheal tube is seen ending 2-3 cm above the carina. A right IJ sheath is noted ending about the mid SVC. A left IJ line is noted  ending about the proximal SVC. No pericardial effusion is identified. No mediastinal lymphadenopathy is seen. The great vessels are grossly unremarkable, though difficult to assess without contrast. The thyroid gland is unremarkable in appearance. No axillary lymphadenopathy is seen.  There is persistent haziness about the pancreas, not fully assessed on this study. This reflects persistent pancreatitis. Underlying new small volume ascites is noted within the upper abdomen, surrounding the liver and spleen.  There is diffuse wall edema and thickening involving the visualized portions of the colon, compatible with acute colitis, new from the recent prior study. C. difficile colitis is a concern. Would correlate clinically.  Nonspecific perinephric stranding  is noted bilaterally. The gallbladder is filled with contrast. The adrenal glands are grossly unremarkable. A nasogastric tube is noted ending at the fundus of the stomach.  Note is made of a feeding tube coiled back on itself in the fundus of the stomach, and extending back into the distal esophagus, where it coils on itself several times, and ends at the distal esophagus.  No acute osseous abnormalities are seen. Minimal degenerative change is noted at the lower cervical spine.  IMPRESSION: 1. Small bilateral pleural effusions, with partial consolidation of both lower lung lobes. This is new from the prior study, and raises concern for pneumonia. Aspiration cannot be excluded, though no debris is seen within the visualized bronchioles. 2. New diffuse colonic wall edema and thickening, compatible with acute colitis. C. difficile colitis is a concern. Would correlate clinically. 3. Pancreatitis again noted, not fully assessed on this study. New underlying small volume ascites noted within the upper abdomen. 4. Feeding tube noted coiled back on itself in the fundus of the stomach, extending back into the distal esophagus, where it coils on itself several times, and ends at the distal esophagus. This should be removed and replaced. 5. Nasogastric tube noted ending at the fundus of the stomach; this could be advanced, as deemed clinically appropriate.  These results were called by telephone at the time of interpretation on 06/25/2014 at 4:36 am to Nursing on [facility], who verbally acknowledged these results.   Electronically Signed   By: Garald Balding M.D.   On: 06/25/2014 07:52   US Abdomen Complete 06/24/2014   CLINICAL DATA:  Subsequent evaluation for acute pancreatitis, status post abdominal surgery 07/08/2014, re-evaluate abnormal gallbladder  EXAM: ULTRASOUND ABDOMEN COMPLETE  COMPARISON:  06/02/2014  FINDINGS: Gallbladder: The gallbladder is somewhat contracted. Wall thickness is within normal limits. No stones  or pericholecystic fluid identified.  Common bile duct: Diameter: 2 mm  Liver: Extremely limited visualization. Abdominal wall dressings interfere with sonography. No focal abnormalities visualized in the areas that could be evaluated  IVC: Not seen  Pancreas: Not seen  Spleen: Not seen  Right Kidney:  Not seen  Left Kidney:  Not seen  Abdominal aorta: Not seen  Other findings: Study is limited by patient body habitus and abdominal wall postsurgical dressings.  IMPRESSION: Extremely limited study showing mildly contracted gallbladder with no gallbladder wall thickening. No biliary dilatation identified.   Electronically Signed   By: Skipper Cliche M.D.   On: 06/24/2014 10:32   Dg Chest Port 1 View 06/25/2014   CLINICAL DATA:  Assess pleural effusion  EXAM: PORTABLE CHEST - 1 VIEW  COMPARISON:  Portable chest x-ray of June 24, 2014 and chest CT scan of today's date at 1:42 a.m.  FINDINGS: The right lung is hypoinflated but clear. On the left there is obscuration of the hemidiaphragm and blunting of  the costophrenic angle and increased retrocardiac density. The cardiac silhouette is normal in size. The pulmonary vascularity is not engorged.  The endotracheal tube tip projects 4 cm above the crotch of the carina. The right internal jugular Cordis sheath tip projects over the midportion of the SVC. The left internal jugular venous catheter tip projects over the proximal portion of the SVC. The radiodense tipped feeding tube has doubled upon itself and its tip lies just above the level of the GE junction while a portion of the coil tube extends below the GE junction into the gastric cardia. Withdrawal and repositioning is needed.  IMPRESSION: 1. Stable bilateral pulmonary hypoinflation with left basilar atelectasis and small left pleural effusion. 2. The feeding tube the tip lies in the region of the distal esophagus. Portions of the tube extend more distally into the gastric cardia. Withdrawal of the tube and  repositioning is recommended prior to use of the tube. 3. The other support tubes and lines are in reasonable position radiographically. 4. These results will be called to the ordering clinician or representative by the Radiologist Assistant, and communication documented in the PACS or zVision Dashboard.   Electronically Signed   By: David  Martinique   On: 06/25/2014 07:37    Dg Abd Portable 1v 06/25/2014   CLINICAL DATA:  NG tube placement.  EXAM: PORTABLE ABDOMEN - 1 VIEW  COMPARISON:  06/24/2014 .  FINDINGS: NG tube in stable position. No bowel distention or free air. Surgical clips over the abdomen. No acute bony abnormality.  IMPRESSION: NG tube in stable position with tip in proximal stomach. No bowel distention.   Electronically Signed   By: Marcello Moores  Register   On: 06/25/2014 07:46   Scheduled Meds: . sodium chloride   Intravenous Once  . sodium chloride   Intravenous Once  . antiseptic oral rinse  7 mL Mouth Rinse QID  . chlorhexidine  15 mL Mouth Rinse BID  . imipenem-cilastatin  250 mg Intravenous Q12H  . pantoprazole (PROTONIX) IV  40 mg Intravenous QHS   Continuous Infusions: . sodium chloride 150 mL/hr at 06/25/14 0530  . amiodarone Stopped (06/25/14 0300)  . fentaNYL infusion INTRAVENOUS Stopped (06/23/14 1739)  . norepinephrine (LEVOPHED) Adult infusion 30 mcg/min (06/25/14 0820)  . phenylephrine (NEO-SYNEPHRINE) Adult infusion 90 mcg/min (06/25/14 0529)  .  sodium bicarbonate  infusion 1000 mL 175 mL/hr at 06/25/14 0020  . vasopressin (PITRESSIN) infusion - *FOR SHOCK* 0.03 Units/min (06/24/14 2031)   PRN Meds:.sodium chloride, fentaNYL   ASSESMENT:   *  Acute pancreatitis.    *  Colitis, rectal bleeding.  C diff negative. No associated acute blood loss anemia.  S/p 2/7 ex lap for suspicion of ischemic bowel:  No ischemic bowel at the time.  However with bleeding and CT findings suspect ischemic colitis.   *  Septic shock, multiorgan failure: VDRF, ARF (oliguric but urine  output improved).  Supporting with multiple pressors, imipenem.  Rising WBC count.   *  Coagulopathy, improved.   *  Hypocalcemia.   *  Thrombocytopenia.   *  AFib.     PLAN   *  Note Dr Golden Pop orders for non-contrast CT ab/pelvis and placement of post pyloric FT. .   * Re: post pyloric tube feedings.  Given hx of gastroparesis, and current acute colitis and pancreatitis, high likelihood these will not be tolerated. May be best to support with TNA.   *  Stool for C diff is negative.   *  LFTs in AM.  Will add lipase in AM.    [name]  06/25/2014, 8:20 AM Pager: [phone] Attending MD note:   I have taken a history, examined the patient, and reviewed the chart. , Ct scan of the chest partially reveals edematous pancreas, he is going to have a abd. CT today following placement of postpyloric  Feeding tube. Diffusely thickened colon c/w ischemic or toxic colitis secondary to pancreatitis. C.Diff negative. Consider cTNA if TF's not tolerated.  Melburn Popper [facility]astroenter[facility] Pager # [phone]

## 2014-06-25 NOTE — Progress Notes (Signed)
Admit: 07/13/2014 LOS: 4  23M w/ AKI, normal BL SCr, 2/2 NSAID, CIN, Septic Shock, in setting of [employer] AG 2/2 Lactic Acidosis, ? Acute abdomen (neg ex-lap)  Subjective:  Still req 3 pressprs CK up to 5000 Req HCO3 gtt  02/09 0701 - 02/10 0700 In: 99242 [I.V.:7888; IV Piggyback:3150] Out: 710 [Urine:710]  Filed Weights   06/30/2014 0341 06/24/14 0500 06/25/14 0500  Weight: 94.7 kg (208 lb 12.4 oz) 95 kg (209 lb 7 oz) 95.9 kg (211 lb 6.7 oz)    Scheduled Meds: . sodium chloride   Intravenous Once  . sodium chloride   Intravenous Once  . antiseptic oral rinse  7 mL Mouth Rinse QID  . chlorhexidine  15 mL Mouth Rinse BID  . imipenem-cilastatin  250 mg Intravenous Q12H  . pantoprazole (PROTONIX) IV  40 mg Intravenous QHS   Continuous Infusions: . sodium chloride 150 mL/hr at 06/25/14 1259  . amiodarone Stopped (06/25/14 0300)  . fentaNYL infusion INTRAVENOUS Stopped (06/23/14 1739)  . norepinephrine (LEVOPHED) Adult infusion 25 mcg/min (06/25/14 1040)  . phenylephrine (NEO-SYNEPHRINE) Adult infusion 80 mcg/min (06/25/14 1100)  . dialysis replacement fluid (prismasate)    . dialysis replacement fluid (prismasate)    . dialysate (PRISMASATE)    .  sodium bicarbonate  infusion 1000 mL 75 mL/hr at 06/25/14 1026  . vasopressin (PITRESSIN) infusion - *FOR SHOCK* 0.03 Units/min (06/24/14 2031)   PRN Meds:.sodium chloride, fentaNYL, heparin, sodium chloride  Current Labs: reviewed    Physical Exam:  Blood pressure 105/65, pulse 55, temperature 97.5 F (36.4 C), temperature source Core (Comment), resp. rate 35, height 6' (1.829 m), weight 95.9 kg (211 lb 6.7 oz), SpO2 100 %. Intubated, sedated Abd dressed Coarse BS b/l 2+ LEE, pitting RRR, nl s1s2  A/P 1. 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 No outpt nephrogy, apparent nl GFR at baseline 2. Inadequate UOP 3. Prognosis quite poor, given persistent acidosis, uremia, fluid control start CRRT today 2. Hyperkalemia, mild;  resolved 3. Persistent lactic acidosis, ? Related to hypotension / pressors / septic shock 4. Mild Rhabdo 5. Septic Shock on 3 pressors and imipenem 6. VDRF 7. ? Pancreatitis / duodenitis, neg ex-lap 06/25/2014; GI following 8. Nonobstructive chronic hydro 1. Urology following 2. Foley in place 3. Likely from reflux 4. No current issues, foley to remain  Pearson Grippe MD 06/25/2014, 2:14 PM   Recent Labs Lab 06/24/14 0505 06/24/14 1500 06/25/14 0426 06/25/14 1145  NA 137 139 139 138  K 5.3* 4.5 5.0 4.2  CL 94* 97 94* 92*  CO2 12* 10* 12* 14*  GLUCOSE 101* 103* 145* 139*  BUN 61* 55* 59* 56*  CREATININE 4.92* 4.77* 4.61* 4.54*  CALCIUM 5.0* 4.3* 4.4* 4.3*  PHOS 7.4*  --  7.1* 6.8*    Recent Labs Lab 06/23/14 0500 06/24/14 0505 06/24/14 2019 06/25/14 0426  WBC 14.1* 17.8*  --  18.6*  NEUTROABS 11.7* 16.1*  --  17.4*  HGB 10.7* 10.6* 10.3* 10.8*  HCT 31.1* 31.0* 29.8* 31.3*  MCV 94.2 96.3  --  95.4  PLT 62* 77*  --  70*

## 2014-06-25 NOTE — Progress Notes (Signed)
RT Note:  Pt transported to/from CT without event

## 2014-06-25 NOTE — Progress Notes (Signed)
Patient becoming increasingly hypotensive despite vasoactive drips.  CCM NP at beside.  Orders received.  Family updated on patient condition.

## 2014-06-25 NOTE — Progress Notes (Signed)
Pt's Ca 4.4, called results to Dr. Jimmy Footman

## 2014-06-26 ENCOUNTER — Inpatient Hospital Stay (HOSPITAL_COMMUNITY): Payer: Commercial Managed Care - HMO

## 2014-06-26 LAB — MITOCHONDRIAL ANTIBODIES: Mitochondrial M2 Ab, IgG: 0.31 (ref <0.91)

## 2014-06-26 LAB — ALDOLASE: Aldolase: 15.1 U/L — ABNORMAL HIGH (ref <=8.1)

## 2014-06-27 ENCOUNTER — Ambulatory Visit: Payer: Self-pay

## 2014-06-27 LAB — ANTI-SMOOTH MUSCLE ANTIBODY, IGG: F-Actin IgG: 8 U (ref <20)

## 2014-06-29 LAB — CULTURE, BLOOD (ROUTINE X 2)
Culture: NO GROWTH
Culture: NO GROWTH

## 2014-07-02 LAB — LACTIC ACID, PLASMA

## 2014-07-08 DIAGNOSIS — Z66 Do not resuscitate: Secondary | ICD-10-CM

## 2014-07-08 NOTE — Discharge Summary (Signed)
DISCHARGE SUMMARY    Date of admit: 07/13/2014  2:37 PM Date of discharge: 29-Jun-2014  4:59 AM Length of Stay: 5 days  PCP is MCKEOWN,WILLIAM DAVID, MD   PROBLEM LIST Principal Problem:   Acute pancreatitis Active Problems:   Acute respiratory failure   Acute renal failure   Acidosis   Lactic acidosis   Metabolic acidosis   Shock   Acute respiratory failure with hypoxemia   DNAR (do not attempt resuscitation)    SUMMARY [name] was 64 y.o. patient with    has a past medical history of Hyperlipidemia; HTN (hypertension); Allergic rhinitis; Headache(784.0); Nocturia; Urinary retention; Foley catheter in place; UTI (urinary tract infection); GERD (gastroesophageal reflux disease); Prediabetes; Depression; Arthritis; and Bell's palsy.   has past surgical history that includes Hernia repair; Nissen fundoplication; Laparoscopic lysis intestinal adhesions; Transurethral resection of prostate (N/A, 03/20/2013); Carpal tunnel release; Nerve Surgery; and laparotomy (N/A, 06/24/2014).   Admitted on 06/27/2014 with severe lactic acidosis for which no cause other than acute pancreatitis discovered despite laparatomy and extensive investigation. HE was intubated on arrival and from them developed acute renal failure, circulatory shock and multi-organ failure. Despite aggressive therapies his lactic acidosis persisted and worsened and finally in terminal decline he was made DNAR after discussion with brother and patient passed away 29-Jun-2014    SIGNED Dr. Brand Males, M.D., [facility].C.P Pulmonary and Critical Care Medicine Staff Physician [place] [facility] Pager: [phone], If no answer or between  15:00h - 7:00h: call [phone]  07/08/2014 10:52 AM

## 2014-07-15 NOTE — Significant Event (Signed)
I had a long discussion with [name]'s family, including his brother [name], and [name]'s wife. I explained to them how critically ill he is, despite all of the medical therapies we are providing him. He continues to decline. They acknowledge and understanding of this, and relayed to me that [name] told them he would never want to be on prolonged life support. They are of the mindset that he should be changed to a DNR code status at this time, however, they would like [place] to continue aggressive medical therapy. Will make appropriate changes in medical record.   Plan: Code status to DNR, continue aggressive medical therapy.   Georgann Housekeeper, AGACNP-BC [facility] Pulmonology/Critical Care Pager [phone] or [phone]

## 2014-07-15 NOTE — Progress Notes (Signed)
Approximately 150cc of fentanyl wasted down sink with Hilda Blades RN

## 2014-07-15 DEATH — deceased

## 2014-08-29 ENCOUNTER — Ambulatory Visit: Payer: Self-pay | Admitting: Internal Medicine

## 2015-02-16 ENCOUNTER — Encounter: Payer: Self-pay | Admitting: Internal Medicine

## 2016-01-20 IMAGING — CT CT CHEST W/O CM
2 of 3 series · 13 of 36 positions shown, 16 images · non-contrast
Comparison: Chest radiograph performed 06/24/2014, and CT of the
chest performed 06/21/2014

CLINICAL DATA: Acute respiratory failure and hypoxemia. Multiple
organ failure. Initial encounter.

EXAM:
CT CHEST WITHOUT CONTRAST
TECHNIQUE: Multidetector CT imaging of the chest was performed following the
standard protocol without IV contrast..

[Series 201: chest without, idose (2) · axial · non-contrast · 0.76mm/px · z∈[+198,+448]mm · 10 of 60 slices shown, 13 images]
[im 5/60  mediastinal]
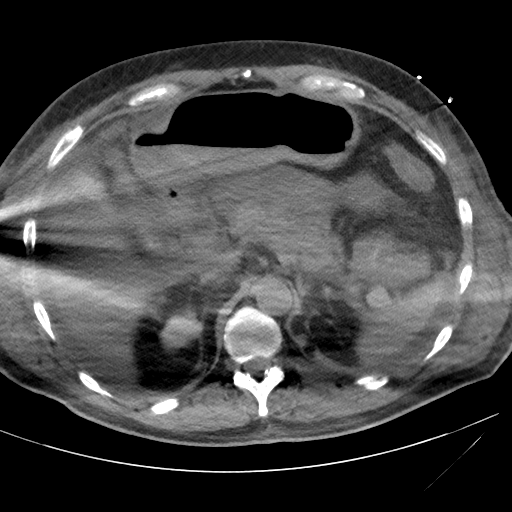
[im 5/60  lung]
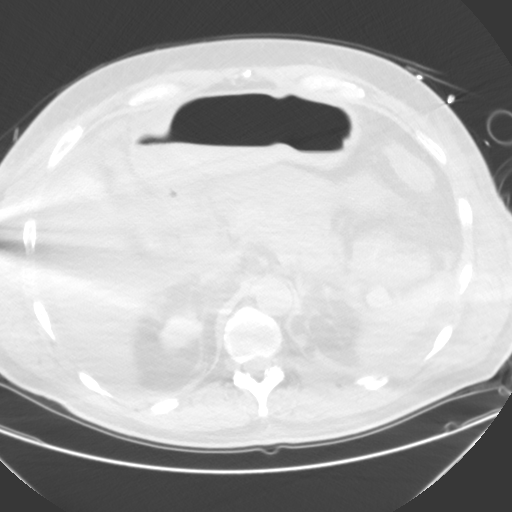
[im 9/60  lung]
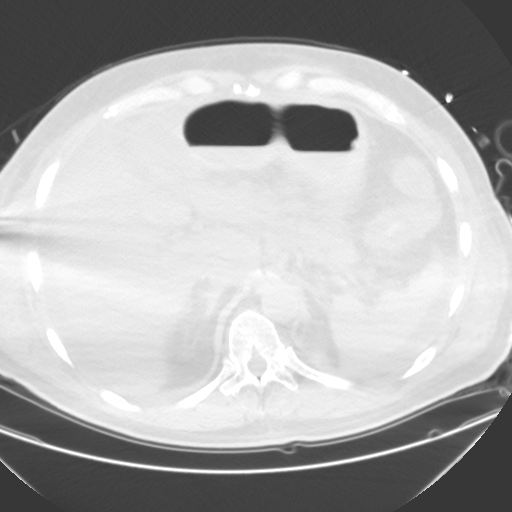
[im 16/60  lung]
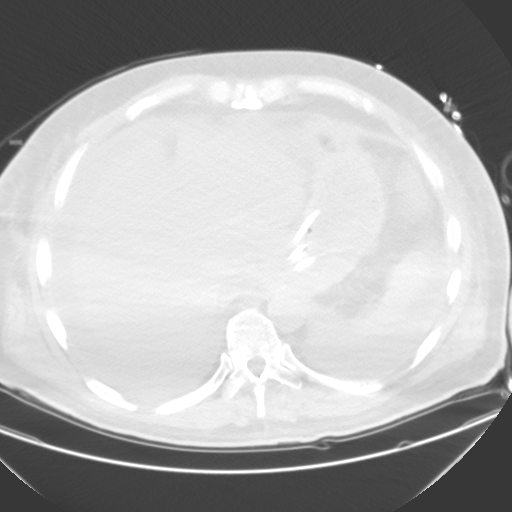
[im 22/60  lung]
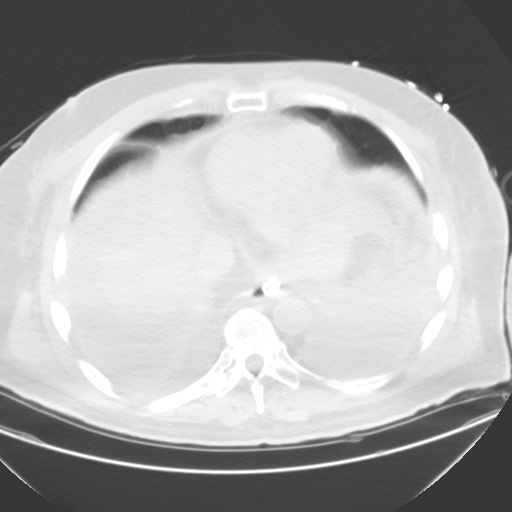
[im 27/60  mediastinal]
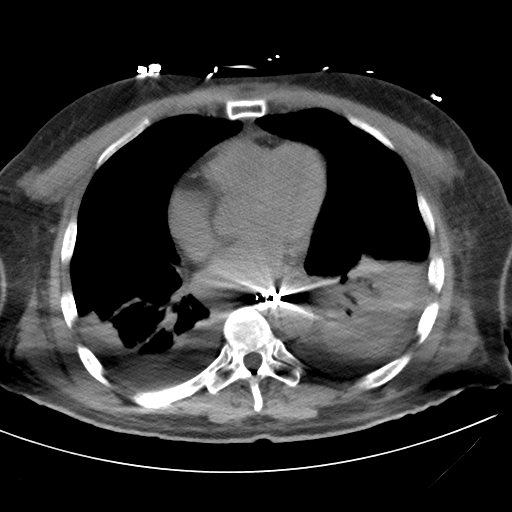
[im 27/60  lung]
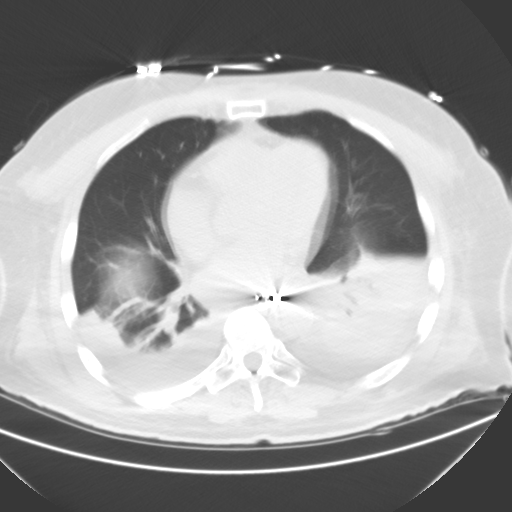
[im 33/60  lung]
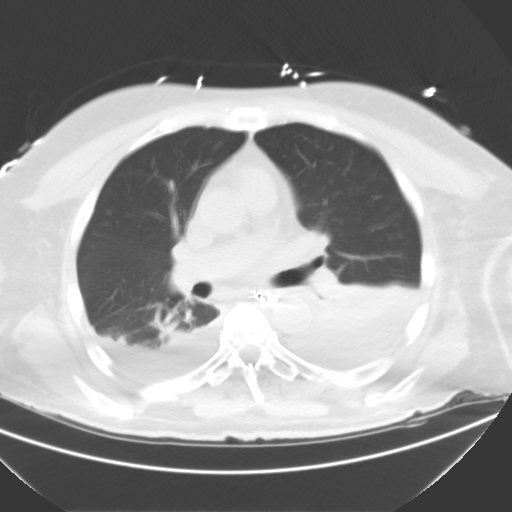
[im 38/60  lung]
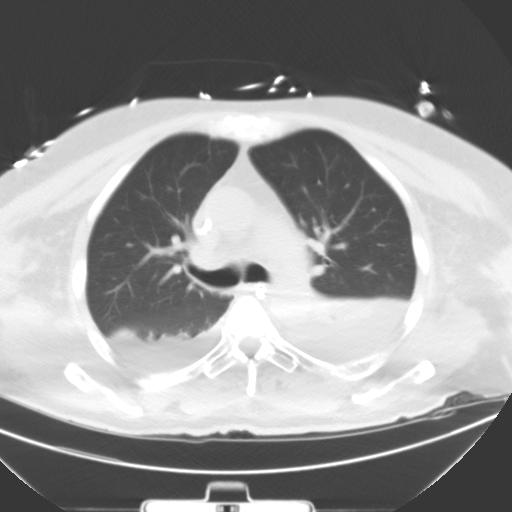
[im 44/60  lung]
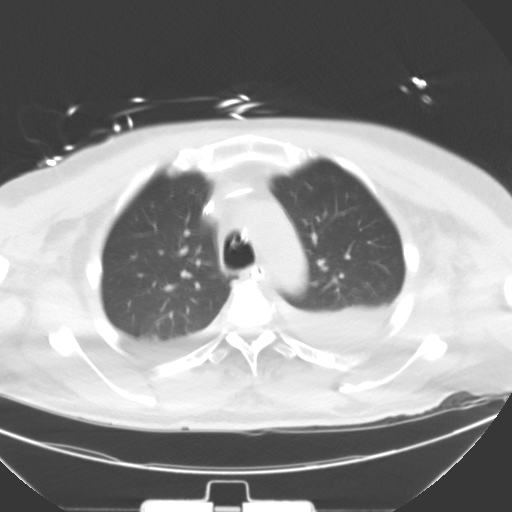
[im 51/60  mediastinal]
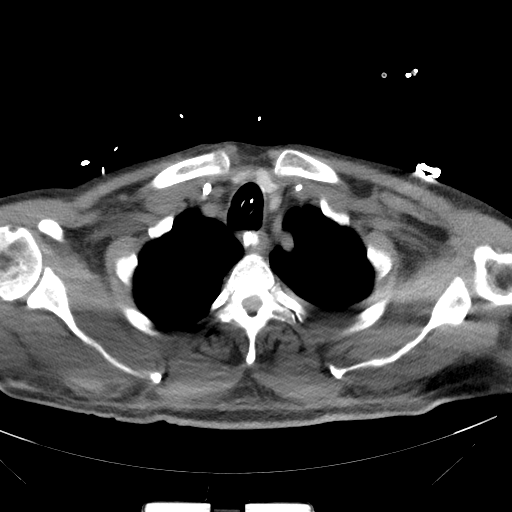
[im 51/60  lung]
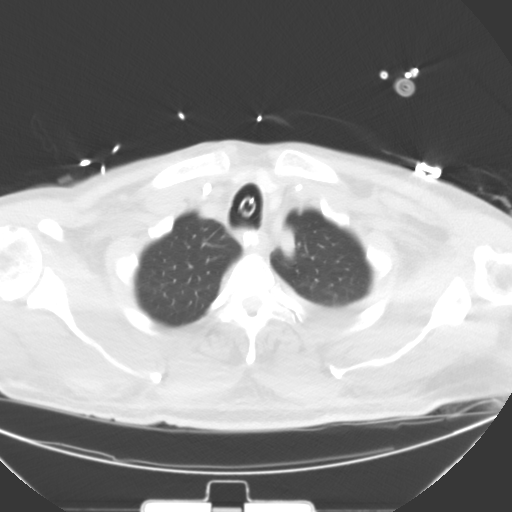
[im 55/60  lung]
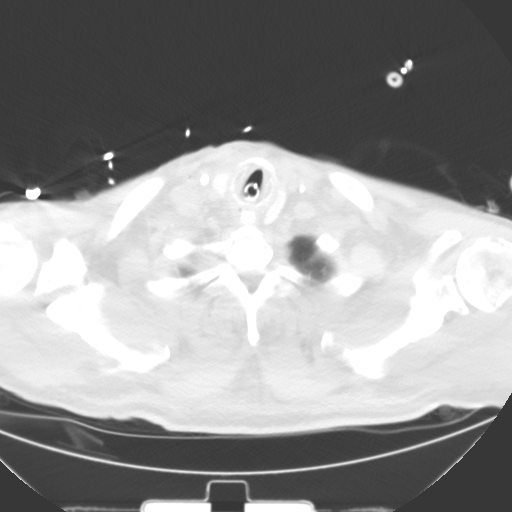

[Series 202: coronal, idose (2) · coronal · 0.50mm/px · 3 of 145 slices shown]
[im 29/145  lung]
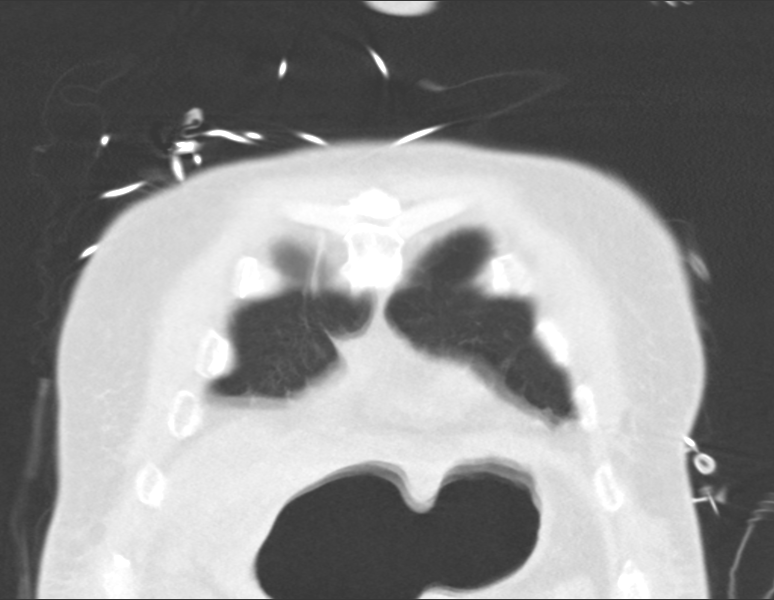
[im 58/145  lung]
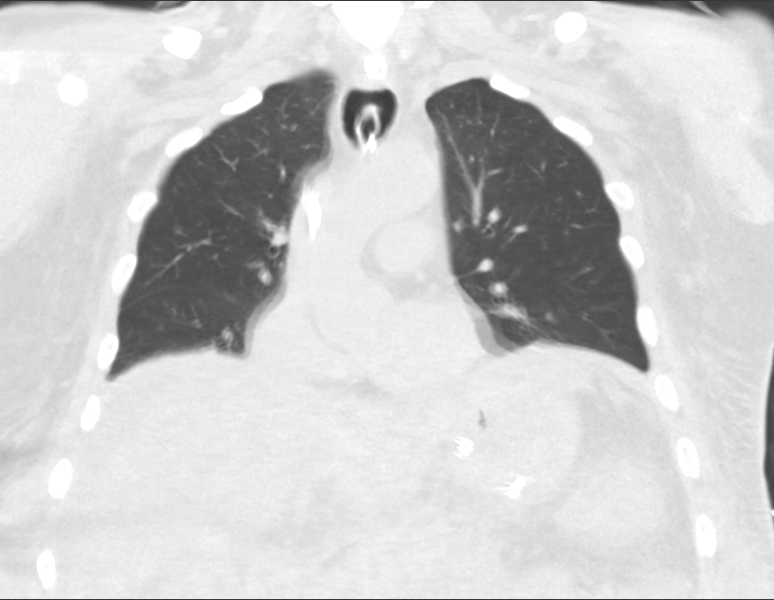
[im 87/145  lung]
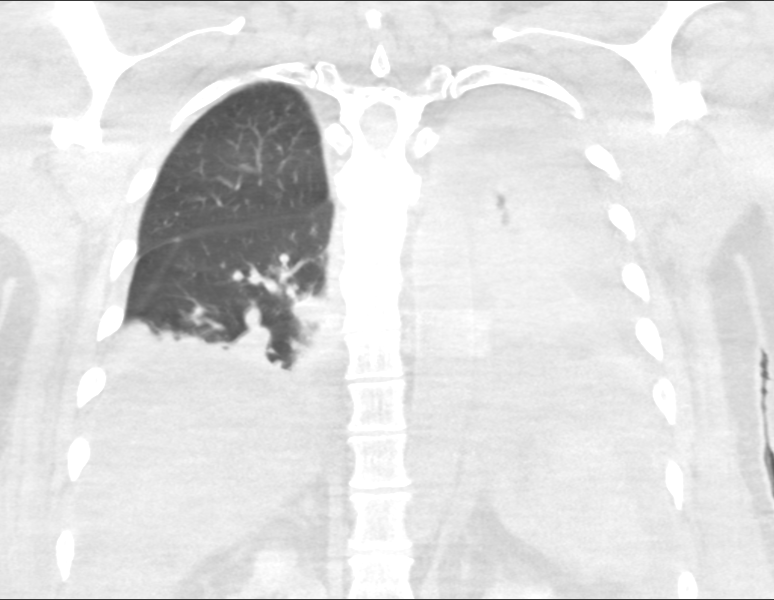

[13 of 36 positions shown; findings below may reference images not displayed]

FINDINGS: Small bilateral pleural effusions are seen, with partial
consolidation of both lower lung lobes. This raises concern for
pneumonia; aspiration cannot be excluded, though no debris is noted
in the visualized bronchioles. The expanded portions of both lungs
appear clear. No masses are seen, though evaluation is limited in
areas of airspace consolidation.

The patient's endotracheal tube is seen ending 2-3 cm above the
carina. A right IJ sheath is noted ending about the mid SVC. A left
IJ line is noted ending about the proximal SVC. No pericardial
effusion is identified. No mediastinal lymphadenopathy is seen. The
great vessels are grossly unremarkable, though difficult to assess
without contrast. The thyroid gland is unremarkable in appearance.
No axillary lymphadenopathy is seen.

There is persistent haziness about the pancreas, not fully assessed
on this study. This reflects persistent pancreatitis. Underlying new
small volume ascites is noted within the upper abdomen, surrounding
the liver and spleen.

There is diffuse wall edema and thickening involving the visualized
portions of the colon, compatible with acute colitis, new from the
recent prior study. C. difficile colitis is a concern. Would
correlate clinically.

Nonspecific perinephric stranding is noted bilaterally. The
gallbladder is filled with contrast. The adrenal glands are grossly
unremarkable. A nasogastric tube is noted ending at the fundus of
the stomach.

Note is made of a feeding tube coiled back on itself in the fundus
of the stomach, and extending back into the distal esophagus, where
it coils on itself several times, and ends at the distal esophagus.

No acute osseous abnormalities are seen. Minimal degenerative change
is noted at the lower cervical spine.
IMPRESSION: 1. Small bilateral pleural effusions, with partial consolidation of
both lower lung lobes. This is new from the prior study, and raises
concern for pneumonia. Aspiration cannot be excluded, though no
debris is seen within the visualized bronchioles.
2. New diffuse colonic wall edema and thickening, compatible with
acute colitis. C. difficile colitis is a concern. Would correlate
clinically.
3. Pancreatitis again noted, not fully assessed on this study. New
underlying small volume ascites noted within the upper abdomen.
4. Feeding tube noted coiled back on itself in the fundus of the
stomach, extending back into the distal esophagus, where it coils on
itself several times, and ends at the distal esophagus. This should
be removed and replaced.
5. Nasogastric tube noted ending at the fundus of the stomach; this
could be advanced, as deemed clinically appropriate.

These results were called by telephone at the time of interpretation
on 06/25/2014 at [DATE] to Nursing on [id number], who verbally
acknowledged these results.
